# Patient Record
Sex: Male | Born: 1955 | Race: White | Hispanic: No | Smoking: Current every day smoker
Health system: Southern US, Community
[De-identification: ages and names within clinical notes are randomized; demographics above are authoritative.]

## PROBLEM LIST (undated history)

## (undated) DIAGNOSIS — F329 Major depressive disorder, single episode, unspecified: Secondary | ICD-10-CM

## (undated) DIAGNOSIS — F32A Depression, unspecified: Secondary | ICD-10-CM

## (undated) DIAGNOSIS — E119 Type 2 diabetes mellitus without complications: Secondary | ICD-10-CM

## (undated) HISTORY — PX: CHOLECYSTECTOMY: SHX55

## (undated) HISTORY — PX: OTHER SURGICAL HISTORY: SHX169

---

## 2013-12-03 ENCOUNTER — Encounter (HOSPITAL_COMMUNITY): Payer: Self-pay | Admitting: Emergency Medicine

## 2013-12-03 ENCOUNTER — Emergency Department (HOSPITAL_COMMUNITY)
Admission: EM | Admit: 2013-12-03 | Discharge: 2013-12-03 | Disposition: A | Discharge status: Home / Self Care | Payer: Medicaid - Out of State | Attending: Emergency Medicine | Admitting: Emergency Medicine

## 2013-12-03 DIAGNOSIS — R112 Nausea with vomiting, unspecified: Secondary | ICD-10-CM | POA: Insufficient documentation

## 2013-12-03 DIAGNOSIS — Z794 Long term (current) use of insulin: Secondary | ICD-10-CM | POA: Insufficient documentation

## 2013-12-03 DIAGNOSIS — F19939 Other psychoactive substance use, unspecified with withdrawal, unspecified: Secondary | ICD-10-CM | POA: Insufficient documentation

## 2013-12-03 DIAGNOSIS — IMO0001 Reserved for inherently not codable concepts without codable children: Secondary | ICD-10-CM | POA: Insufficient documentation

## 2013-12-03 DIAGNOSIS — R51 Headache: Secondary | ICD-10-CM | POA: Insufficient documentation

## 2013-12-03 DIAGNOSIS — F1120 Opioid dependence, uncomplicated: Secondary | ICD-10-CM | POA: Insufficient documentation

## 2013-12-03 DIAGNOSIS — E119 Type 2 diabetes mellitus without complications: Secondary | ICD-10-CM | POA: Insufficient documentation

## 2013-12-03 DIAGNOSIS — R300 Dysuria: Secondary | ICD-10-CM | POA: Insufficient documentation

## 2013-12-03 DIAGNOSIS — F17200 Nicotine dependence, unspecified, uncomplicated: Secondary | ICD-10-CM | POA: Insufficient documentation

## 2013-12-03 DIAGNOSIS — F1123 Opioid dependence with withdrawal: Secondary | ICD-10-CM

## 2013-12-03 DIAGNOSIS — J3489 Other specified disorders of nose and nasal sinuses: Secondary | ICD-10-CM | POA: Insufficient documentation

## 2013-12-03 DIAGNOSIS — R002 Palpitations: Secondary | ICD-10-CM | POA: Insufficient documentation

## 2013-12-03 DIAGNOSIS — R197 Diarrhea, unspecified: Secondary | ICD-10-CM | POA: Insufficient documentation

## 2013-12-03 DIAGNOSIS — R6883 Chills (without fever): Secondary | ICD-10-CM | POA: Insufficient documentation

## 2013-12-03 DIAGNOSIS — H539 Unspecified visual disturbance: Secondary | ICD-10-CM | POA: Insufficient documentation

## 2013-12-03 DIAGNOSIS — M25549 Pain in joints of unspecified hand: Secondary | ICD-10-CM | POA: Insufficient documentation

## 2013-12-03 HISTORY — DX: Type 2 diabetes mellitus without complications: E11.9

## 2013-12-03 MED ORDER — BUPRENORPHINE HCL-NALOXONE HCL 8-2 MG SL SUBL
SUBLINGUAL_TABLET | SUBLINGUAL | Status: AC
  Filled 2013-12-03: qty 1

## 2013-12-03 MED ORDER — BUPRENORPHINE HCL 8 MG SL SUBL: 20.0000 mg | SUBLINGUAL_TABLET | Freq: Every day | SUBLINGUAL | Status: DC

## 2013-12-03 MED ORDER — BUPRENORPHINE HCL-NALOXONE HCL 8-2 MG SL SUBL
2.0000 | SUBLINGUAL_TABLET | Freq: Every day | SUBLINGUAL | Status: DC
  Administered 2013-12-03: 2 via SUBLINGUAL

## 2013-12-03 NOTE — ED Provider Notes (Signed)
CSN: [id number]     Arrival date & time 12/03/13  1440 History  This chart was scribed for Vanetta Mulders, MD by Elon Spanner, ED Scribe. This patient was seen in room APA18/APA18 and the patient's care was started at 4:44 PM.    Chief Complaint  Patient presents with  . Joint Pain    The history is provided by the patient. No language interpreter was used.    HPI Comments: Tyler Burgess is a 58 y.o. male who presents to the Emergency Department complaining of constant, generalized body pain that began 3 days ago after failing to take his daily dose of Subutex.  Patient states he has taken 20 mg ( 8 mg x 2 and 2 mg x 2) of sublingual Subutex/Methadone daily for four years to treat his opiate dependency.  Patient states he recently moved to the area and missed his daily appointment at a methadone clinic and has not taken any Subutex for the last three days.  He describes his current feeling as his arms "want to fly", "shaky", and says the pain is "terrible".  Patient states he typically is seen at [facility] as well as a Methadone clinic in [place] to receive his Subutex/Methadone.  Patient states he has had associated chills, blurry vision, congestion, nausea, vomiting, diarrhea, palpitations, abdominal pain, dysuria.  Patient denies SOB.      PCP: none Past Medical History  Diagnosis Date  . Diabetes mellitus without complication    Past Surgical History  Procedure Laterality Date  . Right arm    . Cholecystectomy     No family history on file. History  Substance Use Topics  . Smoking status: Current Every Day Smoker -- 0.75 packs/day    Types: Cigarettes  . Smokeless tobacco: Not on file  . Alcohol Use: No    Review of Systems  Constitutional: Positive for chills. Negative for fever.  HENT: Positive for congestion. Negative for rhinorrhea and sore throat.   Eyes: Positive for visual disturbance.  Respiratory: Negative for cough and shortness of breath.    Cardiovascular: Positive for palpitations. Negative for leg swelling.  Gastrointestinal: Positive for nausea, vomiting, abdominal pain and diarrhea.  Genitourinary: Positive for dysuria.  Musculoskeletal: Positive for myalgias. Negative for back pain and neck pain.  Skin: Negative for rash.  Neurological: Positive for tremors and headaches.  Hematological: Does not bruise/bleed easily.  Psychiatric/Behavioral: Negative for confusion.      Allergies  Review of patient's allergies indicates no known allergies.  Home Medications   Prior to Admission medications   Medication Sig Start Date End Date Taking? Authorizing Provider  buprenorphine (SUBUTEX) 2 MG SUBL SL tablet Place 4 mg under the tongue every morning.   Yes Historical Provider, MD  buprenorphine (SUBUTEX) 8 MG SUBL SL tablet Place 16 mg under the tongue every morning.   Yes Historical Provider, MD  doxepin (SINEQUAN) 25 MG capsule Take 25 capsules by mouth at bedtime as needed. For sleep 09/27/13  Yes Historical Provider, MD  LANTUS 100 UNIT/ML injection Inject 24 Units into the skin at bedtime. 10/03/13  Yes Historical Provider, MD  NOVOLOG 100 UNIT/ML injection Inject 8 Units into the skin 3 (three) times daily before meals. 10/03/13  Yes Historical Provider, MD   BP 125/95  Pulse 84  Temp(Src) 97.9 F (36.6 C) (Oral)  Resp 18  Ht 5\' 10"  (1.778 m)  Wt 165 lb (74.844 kg)  BMI 23.68 kg/m2  SpO2 100% Physical Exam  Nursing note  and vitals reviewed. Constitutional: He is oriented to person, place, and time. He appears well-developed and well-nourished. No distress.  HENT:  Head: Normocephalic and atraumatic.  Eyes: Conjunctivae and EOM are normal.  Neck: Neck supple. No tracheal deviation present.  Cardiovascular: Normal rate, regular rhythm and normal heart sounds.   Pulmonary/Chest: Effort normal. No respiratory distress. He has no wheezes. He has no rales.  Abdominal: Bowel sounds are normal. There is no tenderness.   Musculoskeletal: Normal range of motion. He exhibits no edema.  Neurological: He is alert and oriented to person, place, and time. No cranial nerve deficit. He exhibits normal muscle tone. Coordination normal.  Skin: Skin is warm and dry.  Psychiatric: He has a normal mood and affect. His behavior is normal.    ED Course  Procedures (including critical care time)  DIAGNOSTIC STUDIES: Oxygen Saturation is 100% on RA, normal by my interpretation.    COORDINATION OF CARE:  4:55 PM Discussed that it would be difficult to get the patient admitted to an in-patient detox center considering his narcotic addiction has been well-managed with the Subutex/Methadone.  Discussed plan to provide patient with resources to seek further follow-up treatment for his opiate dependency.  Patient acknowledges and agrees with plan.    6:02 PM Advised patient that the pharmacy at [facility] did not have Subutex but did have Suboxone.  Patient was advised that Subutex was available at [facility] in [place], [place], but that he could not be prescribed Suboxone at this facility and also go to [facility] to receive the Subutex.  Patient understood and consented to being prescribed the Suboxone available at this facility.    Labs Review Labs Reviewed - No data to display  Imaging Review No results found.   EKG Interpretation None      MDM   Final diagnoses:  Opioid dependence with withdrawal      Patient nontoxic no acute distress. Patient with long-standing history of opiate dependence. Followed by methadone clinic in his home town. Here visiting his father whose having cancer problems [place]. Patient normally has been getting Subutex I. the methadone clinic in [place]. He failed to show up on Friday to get his doses for the weekend. Patient is now having whole-body pain with nausea vomiting and diarrhea having a withdrawal symptoms. Not particularly tachycardic not diaphoretic.  We did not have Subutex to give him so patient was given Suboxone he was okay with that her pharmacist calculated dose to be appropriate for him. That was given sublingual. Patient will followup with the methadone clinic tomorrow.  I personally performed the services described in this documentation, which was scribed in my presence. The recorded information has been reviewed and is accurate.     Vanetta MuldersScott Quadasia Newsham, MD 12/03/13 [phone]

## 2013-12-03 NOTE — Discharge Instructions (Signed)
[facility] that [facility] been providing your medications for you tomorrow. Return for any newer worse symptoms. Medication given here today we'll hold over until tomorrow.

## 2013-12-03 NOTE — ED Notes (Addendum)
PT has been out of subatex medication for 3 days and c/o increasing pain to all joints x1 day with no relief. PT c/o shaking/decreased appetite/headache and diarrhea.

## 2013-12-03 NOTE — ED Notes (Signed)
Pt informed that the ED does not stock the medication ordered by the EDP. AC notified and is checking hospital pharmacy for medication. Pt verbalized understanding.

## 2013-12-11 DIAGNOSIS — F319 Bipolar disorder, unspecified: Secondary | ICD-10-CM | POA: Insufficient documentation

## 2013-12-11 DIAGNOSIS — F489 Nonpsychotic mental disorder, unspecified: Secondary | ICD-10-CM | POA: Insufficient documentation

## 2013-12-11 DIAGNOSIS — E1142 Type 2 diabetes mellitus with diabetic polyneuropathy: Secondary | ICD-10-CM | POA: Insufficient documentation

## 2014-01-09 DIAGNOSIS — F322 Major depressive disorder, single episode, severe without psychotic features: Secondary | ICD-10-CM | POA: Insufficient documentation

## 2014-01-10 DIAGNOSIS — F1121 Opioid dependence, in remission: Secondary | ICD-10-CM | POA: Insufficient documentation

## 2014-04-09 ENCOUNTER — Emergency Department (HOSPITAL_COMMUNITY)
Admission: EM | Admit: 2014-04-09 | Discharge: 2014-04-11 | Disposition: A | Discharge status: Other Acute Inpatient Hospital | Payer: Medicaid - Out of State | Attending: Emergency Medicine | Admitting: Emergency Medicine

## 2014-04-09 ENCOUNTER — Encounter (HOSPITAL_COMMUNITY): Payer: Self-pay | Admitting: Emergency Medicine

## 2014-04-09 DIAGNOSIS — Z79899 Other long term (current) drug therapy: Secondary | ICD-10-CM | POA: Insufficient documentation

## 2014-04-09 DIAGNOSIS — E119 Type 2 diabetes mellitus without complications: Secondary | ICD-10-CM | POA: Insufficient documentation

## 2014-04-09 DIAGNOSIS — F32A Depression, unspecified: Secondary | ICD-10-CM

## 2014-04-09 DIAGNOSIS — Z720 Tobacco use: Secondary | ICD-10-CM | POA: Insufficient documentation

## 2014-04-09 DIAGNOSIS — F329 Major depressive disorder, single episode, unspecified: Secondary | ICD-10-CM | POA: Insufficient documentation

## 2014-04-09 DIAGNOSIS — R45851 Suicidal ideations: Secondary | ICD-10-CM

## 2014-04-09 DIAGNOSIS — F10920 Alcohol use, unspecified with intoxication, uncomplicated: Secondary | ICD-10-CM

## 2014-04-09 DIAGNOSIS — R45850 Homicidal ideations: Secondary | ICD-10-CM

## 2014-04-09 DIAGNOSIS — F10120 Alcohol abuse with intoxication, uncomplicated: Secondary | ICD-10-CM | POA: Insufficient documentation

## 2014-04-09 LAB — CBC WITH DIFFERENTIAL/PLATELET
BASOS PCT: 1 % (ref 0–1)
Basophils Absolute: 0.1 10*3/uL (ref 0.0–0.1)
Eosinophils Absolute: 0.1 10*3/uL (ref 0.0–0.7)
Eosinophils Relative: 1 % (ref 0–5)
HCT: 38.6 % — ABNORMAL LOW (ref 39.0–52.0)
Hemoglobin: 13.7 g/dL (ref 13.0–17.0)
LYMPHS ABS: 3.8 10*3/uL (ref 0.7–4.0)
Lymphocytes Relative: 34 % (ref 12–46)
MCH: 33.7 pg (ref 26.0–34.0)
MCHC: 35.5 g/dL (ref 30.0–36.0)
MCV: 95.1 fL (ref 78.0–100.0)
Monocytes Absolute: 1.1 10*3/uL — ABNORMAL HIGH (ref 0.1–1.0)
Monocytes Relative: 9 % (ref 3–12)
NEUTROS PCT: 55 % (ref 43–77)
Neutro Abs: 6.3 10*3/uL (ref 1.7–7.7)
Platelets: 220 10*3/uL (ref 150–400)
RBC: 4.06 MIL/uL — AB (ref 4.22–5.81)
RDW: 13 % (ref 11.5–15.5)
WBC: 11.4 10*3/uL — AB (ref 4.0–10.5)

## 2014-04-09 LAB — RAPID URINE DRUG SCREEN, HOSP PERFORMED
Amphetamines: NOT DETECTED
BARBITURATES: NOT DETECTED
Benzodiazepines: NOT DETECTED
Cocaine: NOT DETECTED
Opiates: NOT DETECTED
Tetrahydrocannabinol: NOT DETECTED

## 2014-04-09 LAB — BASIC METABOLIC PANEL
ANION GAP: 13 (ref 5–15)
BUN: 14 mg/dL (ref 6–23)
CHLORIDE: 99 meq/L (ref 96–112)
CO2: 25 meq/L (ref 19–32)
Calcium: 8.9 mg/dL (ref 8.4–10.5)
Creatinine, Ser: 1.23 mg/dL (ref 0.50–1.35)
GFR calc Af Amer: 74 mL/min — ABNORMAL LOW (ref >90)
GFR calc non Af Amer: 64 mL/min — ABNORMAL LOW (ref >90)
Glucose, Bld: 330 mg/dL — ABNORMAL HIGH (ref 70–99)
POTASSIUM: 4.2 meq/L (ref 3.7–5.3)
SODIUM: 137 meq/L (ref 137–147)

## 2014-04-09 LAB — ETHANOL: Alcohol, Ethyl (B): 228 mg/dL — ABNORMAL HIGH (ref 0–11)

## 2014-04-09 LAB — CBG MONITORING, ED
GLUCOSE-CAPILLARY: 293 mg/dL — AB (ref 70–99)
Glucose-Capillary: 284 mg/dL — ABNORMAL HIGH (ref 70–99)

## 2014-04-09 MED ORDER — VITAMIN B-1 100 MG PO TABS
100.0000 mg | ORAL_TABLET | Freq: Every day | ORAL | Status: DC
  Administered 2014-04-09 – 2014-04-10 (×2): 100 mg via ORAL
  Filled 2014-04-09 (×2): qty 1

## 2014-04-09 MED ORDER — DOXEPIN HCL 25 MG PO CAPS
25.0000 mg | ORAL_CAPSULE | Freq: Every evening | ORAL | Status: DC | PRN
  Filled 2014-04-09 (×3): qty 1

## 2014-04-09 MED ORDER — LORAZEPAM 1 MG PO TABS
0.0000 mg | ORAL_TABLET | Freq: Four times a day (QID) | ORAL | Status: DC
  Administered 2014-04-09 – 2014-04-10 (×2): 1 mg via ORAL
  Filled 2014-04-09 (×2): qty 1

## 2014-04-09 MED ORDER — THIAMINE HCL 100 MG/ML IJ SOLN: 100.0000 mg | Freq: Every day | INTRAMUSCULAR | Status: DC

## 2014-04-09 MED ORDER — INSULIN GLARGINE 100 UNIT/ML ~~LOC~~ SOLN
24.0000 [IU] | Freq: Every day | SUBCUTANEOUS | Status: DC
  Administered 2014-04-09: 24 [IU] via SUBCUTANEOUS
  Filled 2014-04-09 (×3): qty 0.24

## 2014-04-09 MED ORDER — LORAZEPAM 1 MG PO TABS: 0.0000 mg | ORAL_TABLET | Freq: Two times a day (BID) | ORAL | Status: DC

## 2014-04-09 MED ORDER — INSULIN ASPART 100 UNIT/ML ~~LOC~~ SOLN
8.0000 [IU] | Freq: Three times a day (TID) | SUBCUTANEOUS | Status: DC
  Administered 2014-04-10 (×2): 8 [IU] via SUBCUTANEOUS
  Filled 2014-04-09 (×3): qty 1

## 2014-04-09 NOTE — ED Notes (Signed)
Pt alert, states that he is frustrated, requesting RN to give him a pistol, pt states that he has been depressed and [facility] was not listening to him and that was why ":I did what I did", pt cooperative, ciwa scale completed, ativan given per ciwa scale, AC notified of need for lantus. [facility] remains at bedside,
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
## 2014-04-09 NOTE — ED Notes (Signed)
Pt watching tv, left foot handcuffed to bed, cms intact [facility] remains at bedside, ,

## 2014-04-09 NOTE — ED Notes (Signed)
Telepsych in process 
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
## 2014-04-09 NOTE — ED Notes (Signed)
Pt resting with eyes closed, resp even and non labored, left wrist handcuffed to stretcher, [facility] at bedside,

## 2014-04-09 NOTE — BH Assessment (Signed)
Write informed TTS Belenda CruiseKristin of the consult.

## 2014-04-10 LAB — CBG MONITORING, ED
GLUCOSE-CAPILLARY: 173 mg/dL — AB (ref 70–99)
GLUCOSE-CAPILLARY: 75 mg/dL (ref 70–99)
GLUCOSE-CAPILLARY: 89 mg/dL (ref 70–99)
Glucose-Capillary: 44 mg/dL — CL (ref 70–99)
Glucose-Capillary: 191 mg/dL — ABNORMAL HIGH (ref 70–99)
Glucose-Capillary: 69 mg/dL — ABNORMAL LOW (ref 70–99)
Glucose-Capillary: 153 mg/dL — ABNORMAL HIGH (ref 70–99)

## 2014-04-10 MED ORDER — INSULIN GLARGINE 100 UNIT/ML ~~LOC~~ SOLN
18.0000 [IU] | Freq: Every day | SUBCUTANEOUS | Status: DC
Start: 2014-04-10 — End: 2014-04-11
  Administered 2014-04-10: 18 [IU] via SUBCUTANEOUS
  Filled 2014-04-10 (×2): qty 0.18

## 2014-04-10 NOTE — ED Notes (Signed)
Per Inetta Fermoina at [facility], [facility] is looking at patient, will continue to seek inpatient placement.

## 2014-04-10 NOTE — [facility] Counselor (Signed)
Faxed referral to [facility]   Kateri PlummerKristin Desia Saban, M.S., LPCA, [facility] Licensed Professional Counselor Associate  Triage Specialist  [facility]  Therapeutic Triage Services Phone: [phone] Fax: [phone]

## 2014-04-10 NOTE — ED Notes (Signed)
[facility] will accept patient if CBG's are consistent for 24 hours per [facility]. EDP aware and Diabetic Coordinator notes reviewed, bedtime Lantus decreased per recommendation.

## 2014-04-10 NOTE — Progress Notes (Signed)
[facility].   Derrell Lollingoris Dayden Viverette, MSW l Social Worker [phone]

## 2014-04-10 NOTE — ED Notes (Addendum)
Patient reports head is filling "fuzzy". CBG is 44. Patient given 4 oz of Orange Juice and breakfast tray. Will not give insulin yet and reassess patient after breakfast.

## 2014-04-10 NOTE — BHH Counselor (Signed)
Faxed to following facilities in attempt to obtain inpatient placement:  [facility] [address]      Cyndie MullAnna Nysha Koplin, [facility] Triage Specialist

## 2014-04-10 NOTE — Progress Notes (Signed)
Inpatient Diabetes Program Recommendations  AACE/ADA: New Consensus Statement on Inpatient Glycemic Control (2013)  Target Ranges:  Prepandial:   less than 140 mg/dL      Peak postprandial:   less than 180 mg/dL (1-2 hours)      Critically ill patients:  140 - 180 mg/dL   Results for Tyler Burgess, [name] (MRN [id number]) as of 04/10/2014 07:42  Ref. Range 04/09/2014 16:55 04/09/2014 21:03 04/10/2014 07:23  Glucose-Capillary Latest Range: 70-99 mg/dL 409293 (H) 811284 (H) 44 (LL)   Outpatient Diabetes medications: Lantus 24 units QHS, Novolog 8 units TID with meals Current orders for Inpatient glycemic control: Lantus 24 units QHS, Novolog 8 units TID with meals  Inpatient Diabetes Program Recommendations Insulin - Basal: Noted fasting glucose was 44 mg/dl this morning. Please consider decreasing Lantus. Correction (SSI): Please consider ordering CBGs with Novolog correction scale ACHS. HgbA1C: Please consider ordering an A1C to evaluate glycemic control over the past 2-3 months.  Thanks, Orlando PennerMarie Madason Rauls, RN, MSN, CCRN, CDE Diabetes Coordinator Inpatient Diabetes Program [phone] (Team Pager) [phone] (AP office) [phone] [facility] office)

## 2014-04-10 NOTE — ED Notes (Signed)
[facility].  Due to Substance Abuse.  Nurse informed.

## 2014-04-11 ENCOUNTER — Encounter (HOSPITAL_COMMUNITY): Payer: Self-pay | Admitting: *Deleted

## 2014-04-11 ENCOUNTER — Inpatient Hospital Stay (HOSPITAL_COMMUNITY)
Admission: EM | Admit: 2014-04-11 | Discharge: 2014-04-18 | DRG: 885 | Disposition: A | Discharge status: Home / Self Care | Payer: 59 | Source: Intra-hospital | Attending: Psychiatry | Admitting: Psychiatry

## 2014-04-11 DIAGNOSIS — F1020 Alcohol dependence, uncomplicated: Secondary | ICD-10-CM | POA: Diagnosis present

## 2014-04-11 DIAGNOSIS — F332 Major depressive disorder, recurrent severe without psychotic features: Secondary | ICD-10-CM | POA: Diagnosis present

## 2014-04-11 DIAGNOSIS — F411 Generalized anxiety disorder: Secondary | ICD-10-CM | POA: Diagnosis present

## 2014-04-11 DIAGNOSIS — F1121 Opioid dependence, in remission: Secondary | ICD-10-CM | POA: Diagnosis present

## 2014-04-11 DIAGNOSIS — F333 Major depressive disorder, recurrent, severe with psychotic symptoms: Secondary | ICD-10-CM

## 2014-04-11 DIAGNOSIS — E1165 Type 2 diabetes mellitus with hyperglycemia: Secondary | ICD-10-CM | POA: Diagnosis present

## 2014-04-11 DIAGNOSIS — Z794 Long term (current) use of insulin: Secondary | ICD-10-CM

## 2014-04-11 DIAGNOSIS — Z634 Disappearance and death of family member: Secondary | ICD-10-CM

## 2014-04-11 DIAGNOSIS — R45851 Suicidal ideations: Secondary | ICD-10-CM

## 2014-04-11 DIAGNOSIS — F1190 Opioid use, unspecified, uncomplicated: Secondary | ICD-10-CM

## 2014-04-11 DIAGNOSIS — Z79899 Other long term (current) drug therapy: Secondary | ICD-10-CM | POA: Diagnosis not present

## 2014-04-11 DIAGNOSIS — F17210 Nicotine dependence, cigarettes, uncomplicated: Secondary | ICD-10-CM | POA: Diagnosis present

## 2014-04-11 DIAGNOSIS — E11649 Type 2 diabetes mellitus with hypoglycemia without coma: Secondary | ICD-10-CM | POA: Diagnosis not present

## 2014-04-11 DIAGNOSIS — R4589 Other symptoms and signs involving emotional state: Secondary | ICD-10-CM | POA: Diagnosis present

## 2014-04-11 DIAGNOSIS — F10239 Alcohol dependence with withdrawal, unspecified: Secondary | ICD-10-CM

## 2014-04-11 DIAGNOSIS — R4689 Other symptoms and signs involving appearance and behavior: Secondary | ICD-10-CM

## 2014-04-11 HISTORY — DX: Major depressive disorder, single episode, unspecified: F32.9

## 2014-04-11 HISTORY — DX: Depression, unspecified: F32.A

## 2014-04-11 LAB — GLUCOSE, CAPILLARY
GLUCOSE-CAPILLARY: 214 mg/dL — AB (ref 70–99)
GLUCOSE-CAPILLARY: 199 mg/dL — AB (ref 70–99)
Glucose-Capillary: 167 mg/dL — ABNORMAL HIGH (ref 70–99)

## 2014-04-11 LAB — CBG MONITORING, ED
Glucose-Capillary: 54 mg/dL — ABNORMAL LOW (ref 70–99)
Glucose-Capillary: 94 mg/dL (ref 70–99)

## 2014-04-11 MED ORDER — INSULIN ASPART 100 UNIT/ML ~~LOC~~ SOLN
8.0000 [IU] | Freq: Three times a day (TID) | SUBCUTANEOUS | Status: DC
  Administered 2014-04-11 – 2014-04-12 (×3): 8 [IU] via SUBCUTANEOUS

## 2014-04-11 MED ORDER — ALUM & MAG HYDROXIDE-SIMETH 200-200-20 MG/5ML PO SUSP: 30.0000 mL | ORAL | Status: DC | PRN

## 2014-04-11 MED ORDER — LORAZEPAM 1 MG PO TABS
1.0000 mg | ORAL_TABLET | Freq: Two times a day (BID) | ORAL | Status: AC
  Administered 2014-04-13 (×2): 1 mg via ORAL
  Filled 2014-04-11: qty 1

## 2014-04-11 MED ORDER — MAGNESIUM HYDROXIDE 400 MG/5ML PO SUSP: 30.0000 mL | Freq: Every day | ORAL | Status: DC | PRN

## 2014-04-11 MED ORDER — LORAZEPAM 1 MG PO TABS
1.0000 mg | ORAL_TABLET | Freq: Four times a day (QID) | ORAL | Status: AC
  Administered 2014-04-11 (×3): 1 mg via ORAL
  Filled 2014-04-11 (×3): qty 1

## 2014-04-11 MED ORDER — TRAZODONE HCL 50 MG PO TABS
50.0000 mg | ORAL_TABLET | Freq: Every day | ORAL | Status: DC
  Administered 2014-04-11 – 2014-04-15 (×5): 50 mg via ORAL
  Filled 2014-04-11 (×9): qty 1

## 2014-04-11 MED ORDER — LORAZEPAM 1 MG PO TABS
1.0000 mg | ORAL_TABLET | Freq: Every day | ORAL | Status: AC
  Administered 2014-04-14: 1 mg via ORAL
  Filled 2014-04-11: qty 1

## 2014-04-11 MED ORDER — ONDANSETRON 4 MG PO TBDP: 4.0000 mg | ORAL_TABLET | Freq: Four times a day (QID) | ORAL | Status: AC | PRN

## 2014-04-11 MED ORDER — THIAMINE HCL 100 MG/ML IJ SOLN
100.0000 mg | Freq: Once | INTRAMUSCULAR | Status: AC
  Administered 2014-04-11: 100 mg via INTRAMUSCULAR
  Filled 2014-04-11: qty 2

## 2014-04-11 MED ORDER — INSULIN ASPART 100 UNIT/ML ~~LOC~~ SOLN
0.0000 [IU] | Freq: Three times a day (TID) | SUBCUTANEOUS | Status: DC
  Administered 2014-04-11: 3 [IU] via SUBCUTANEOUS
  Administered 2014-04-11: 2 [IU] via SUBCUTANEOUS
  Administered 2014-04-12 (×2): 7 [IU] via SUBCUTANEOUS
  Administered 2014-04-13: 9 [IU] via SUBCUTANEOUS
  Administered 2014-04-13: 3 [IU] via SUBCUTANEOUS
  Administered 2014-04-14: 5 [IU] via SUBCUTANEOUS
  Administered 2014-04-14: 17:00:00 via SUBCUTANEOUS
  Administered 2014-04-15: 9 [IU] via SUBCUTANEOUS
  Administered 2014-04-15: 2 [IU] via SUBCUTANEOUS

## 2014-04-11 MED ORDER — LORAZEPAM 1 MG PO TABS
1.0000 mg | ORAL_TABLET | Freq: Three times a day (TID) | ORAL | Status: AC
  Administered 2014-04-12 (×3): 1 mg via ORAL
  Filled 2014-04-11 (×3): qty 1

## 2014-04-11 MED ORDER — LOPERAMIDE HCL 2 MG PO CAPS
2.0000 mg | ORAL_CAPSULE | ORAL | Status: AC | PRN
  Administered 2014-04-13 – 2014-04-14 (×3): 4 mg via ORAL
  Administered 2014-04-14: 2 mg via ORAL
  Filled 2014-04-11: qty 2
  Filled 2014-04-11 (×2): qty 1
  Filled 2014-04-11: qty 2

## 2014-04-11 MED ORDER — HYDROXYZINE HCL 25 MG PO TABS
25.0000 mg | ORAL_TABLET | Freq: Four times a day (QID) | ORAL | Status: AC | PRN
  Administered 2014-04-12: 25 mg via ORAL
  Filled 2014-04-11: qty 1

## 2014-04-11 MED ORDER — ACETAMINOPHEN 325 MG PO TABS: 650.0000 mg | ORAL_TABLET | Freq: Four times a day (QID) | ORAL | Status: DC | PRN

## 2014-04-11 MED ORDER — VITAMIN B-1 100 MG PO TABS
100.0000 mg | ORAL_TABLET | Freq: Every day | ORAL | Status: DC
  Administered 2014-04-12 – 2014-04-18 (×7): 100 mg via ORAL
  Filled 2014-04-11 (×10): qty 1

## 2014-04-11 MED ORDER — INSULIN GLARGINE 100 UNIT/ML ~~LOC~~ SOLN
24.0000 [IU] | Freq: Every day | SUBCUTANEOUS | Status: DC
  Administered 2014-04-11: 24 [IU] via SUBCUTANEOUS

## 2014-04-11 MED ORDER — SERTRALINE HCL 25 MG PO TABS
25.0000 mg | ORAL_TABLET | Freq: Every day | ORAL | Status: DC
  Administered 2014-04-11 – 2014-04-13 (×3): 25 mg via ORAL
  Filled 2014-04-11 (×5): qty 1

## 2014-04-11 MED ORDER — LORAZEPAM 1 MG PO TABS
1.0000 mg | ORAL_TABLET | Freq: Four times a day (QID) | ORAL | Status: AC | PRN
  Filled 2014-04-11: qty 1

## 2014-04-11 MED ORDER — GABAPENTIN 100 MG PO CAPS
100.0000 mg | ORAL_CAPSULE | Freq: Three times a day (TID) | ORAL | Status: DC
  Administered 2014-04-11 – 2014-04-18 (×20): 100 mg via ORAL
  Filled 2014-04-11: qty 42
  Filled 2014-04-11 (×10): qty 1
  Filled 2014-04-11: qty 42
  Filled 2014-04-11: qty 1
  Filled 2014-04-11: qty 42
  Filled 2014-04-11 (×6): qty 1
  Filled 2014-04-11: qty 42
  Filled 2014-04-11 (×3): qty 1
  Filled 2014-04-11: qty 42
  Filled 2014-04-11 (×4): qty 1
  Filled 2014-04-11: qty 42
  Filled 2014-04-11 (×2): qty 1

## 2014-04-11 MED ORDER — ADULT MULTIVITAMIN W/MINERALS CH
1.0000 | ORAL_TABLET | Freq: Every day | ORAL | Status: DC
  Administered 2014-04-11 – 2014-04-18 (×8): 1 via ORAL
  Filled 2014-04-11 (×2): qty 14
  Filled 2014-04-11 (×10): qty 1
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
## 2014-04-11 NOTE — Progress Notes (Signed)
Adult Psychoeducational Group Note  Date:  04/11/2014 Time:  8:46 PM  Group Topic/Focus:  Wrap-Up Group:   The focus of this group is to help patients review their daily goal of treatment and discuss progress on daily workbooks.  Participation Level:  Active  Participation Quality:  Appropriate  Affect:  Appropriate  Cognitive:  Appropriate  Insight: Appropriate  Engagement in Group:  Improving  Modes of Intervention:  Discussion  Additional Comments: The patient expressed that he enjoyed music therapy in group.  Octavio Mannshigpen, Danta Baumgardner Lee 04/11/2014, 8:46 PM

## 2014-04-11 NOTE — BH Assessment (Addendum)
[facility]  [facility]   Dr. Elna BreslowEappen is the accepting doctor and the nurse will arrange [facility] due to the patient being [facility].

## 2014-04-11 NOTE — ED Provider Notes (Signed)
[facility] by Dr Elna BreslowEappen.  Pt is ambulatory in his room. States he has a headache. Is happy [facility].   Devoria AlbeIva Azarya Oconnell, MD, FACEP   Ward GivensIva L Derin Granquist, MD 04/11/14 [phone]

## 2014-04-11 NOTE — Progress Notes (Signed)
Inpatient Diabetes Program Recommendations  AACE/ADA: New Consensus Statement on Inpatient Glycemic Control (2013)  Target Ranges:  Prepandial:   less than 140 mg/dL      Peak postprandial:   less than 180 mg/dL (1-2 hours)      Critically ill patients:  140 - 180 mg/dL   Reason for Assessment: Diabetes Consult  Outpatient Diabetes medications: Lantus 24 units QHS, Novolog 8 units TID with meals Current orders for Inpatient glycemic control: Lantus 24 units QHS, Novolog 8 units TID with meals, Novolog sensitive tidwc   Results for Tyler Burgess, Tyler Burgess (MRN [id number]) as of 04/11/2014 12:17  Ref. Range 04/10/2014 13:06 04/10/2014 13:53 04/10/2014 16:39 04/10/2014 18:19 04/10/2014 22:00 04/11/2014 04:51 04/11/2014 06:52  Glucose-Capillary Latest Range: 70-99 mg/dL 69 (L) 191173 (H) 478153 (H) 75 89 54 (L) 94   Continues with hypoglycemia.  Recommendations: Reduce Lantus to 15 units QHS. Reduce Novolog to 3 units tidwc for meal coverage insulin if pt eats >50% meal. DO NOT GIVE MEAL COVERAGE INSULIN IF CBG < 80 MG/DL.  Will continue to follow. Thank you. Ailene Ardshonda Alexsis Branscom, RD, LDN, CDE Inpatient Diabetes Coordinator [phone]
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
## 2014-04-11 NOTE — ED Notes (Signed)
Paper work complete, Technical sales engineerofficer here to transport pt, officer wanting Pt dressing, belonging returned to pt.
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
## 2014-04-11 NOTE — Tx Team (Signed)
Initial Interdisciplinary Treatment Plan   PATIENT STRESSORS: Financial difficulties Health problems Loss of wife, son, father, and close friends   PATIENT STRENGTHS: Average or above average intelligence Capable of independent living Supportive family/friends   PROBLEM LIST: Problem List/Patient Goals Date to be addressed Date deferred Reason deferred Estimated date of resolution  Sleep disturbance 04/11/2014     depression 04/11/2014                                                DISCHARGE CRITERIA:  Improved stabilization in mood, thinking, and/or behavior Need for constant or close observation no longer present  PRELIMINARY DISCHARGE PLAN: Outpatient therapy  PATIENT/FAMIILY INVOLVEMENT: This treatment plan has been presented to and reviewed with the patient, Tyler Burgess.  The patient and family have been given the opportunity to ask questions and make suggestions.  Leighton Parodyyson, Caylen Kuwahara M 04/11/2014, 12:34 PM

## 2014-04-11 NOTE — ED Notes (Signed)
[facility]

## 2014-04-11 NOTE — Progress Notes (Signed)
Patient reports that he has been having trouble sleeping and constantly thinks of his loss family members; patient loss his wife, brother, father, and 3 close friends since 2013; patient stated he thinks about them all the time; patient reports that he loves in a truck because he loss his home and his business; patient is tearful when speaking about all this and said the only reason he did what he did was for some help

## 2014-04-12 LAB — GLUCOSE, CAPILLARY
GLUCOSE-CAPILLARY: 327 mg/dL — AB (ref 70–99)
GLUCOSE-CAPILLARY: 312 mg/dL — AB (ref 70–99)
Glucose-Capillary: 72 mg/dL (ref 70–99)
Glucose-Capillary: 134 mg/dL — ABNORMAL HIGH (ref 70–99)
Glucose-Capillary: 221 mg/dL — ABNORMAL HIGH (ref 70–99)

## 2014-04-12 LAB — HEMOGLOBIN A1C
HEMOGLOBIN A1C: 9.5 % — AB (ref <5.7)
MEAN PLASMA GLUCOSE: 226 mg/dL — AB (ref <117)

## 2014-04-12 LAB — TSH: TSH: 3.2 u[IU]/mL (ref 0.350–4.500)

## 2014-04-12 MED ORDER — INSULIN ASPART 100 UNIT/ML ~~LOC~~ SOLN
3.0000 [IU] | Freq: Three times a day (TID) | SUBCUTANEOUS | Status: DC
  Administered 2014-04-12 – 2014-04-15 (×9): 3 [IU] via SUBCUTANEOUS

## 2014-04-12 MED ORDER — INSULIN GLARGINE 100 UNIT/ML ~~LOC~~ SOLN
15.0000 [IU] | Freq: Every day | SUBCUTANEOUS | Status: DC
  Administered 2014-04-12 – 2014-04-14 (×3): 15 [IU] via SUBCUTANEOUS

## 2014-04-12 MED ORDER — ARIPIPRAZOLE 2 MG PO TABS
2.0000 mg | ORAL_TABLET | Freq: Every day | ORAL | Status: DC
  Administered 2014-04-12: 2 mg via ORAL
  Filled 2014-04-12 (×2): qty 1
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
## 2014-04-12 NOTE — Progress Notes (Signed)
Pt has been up and active in the milieu today. He rated all his depression hopelessness and anxiety a 5 on his self-inventory.  He denied any A/V/H or H/I but admits to some passive S/I no plans here in the hospital and does contract verbally to come to staff before acting on any self-harm thoughts.  He did admit to hearing a "roaring sound and sometimes a popping sound" encouraged his to talk with the doctor today and he voiced understanding. His goal today,"staying alive and not hanging myself today" he planned to walk and stay around people to attend groups and take his medications.  He did write "I would tell staff before any attempt of hanging. Again, he was encouraged to talk with his doctor and he did agree. Around 1705, pt's roommate informed staff that [name] had a handmade noose. Talking with pt and at first he stated,"I had it in my sock from the emergency room" staff then found the blanket that pt had ripped and torn once pt seen that blanket he admitted he did indeed make it here even though he had verbally contracted to come to staff.  Received an order for 1:1 for pt's safety.  Pt was angry at first saying things like "I am not going to eat or take any medications".  He soon calmed down and he indeed ate his dinner and took his medications without incident. Please see the 1:1 progress note in shadow chart.
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
## 2014-04-12 NOTE — Plan of Care (Signed)
Problem: Diagnosis: Increased Risk For Suicide Attempt Goal: STG-Patient Will Report Suicidal Feelings to Staff Outcome: Progressing Pt does admit to having some passive S/I today he does contract verbally to come to staff before acting on any self-harm thoughts.

## 2014-04-12 NOTE — Tx Team (Signed)
  Interdisciplinary Treatment Plan Update   Date Reviewed:  04/12/2014  Time Reviewed:  9:41 AM  Progress in Treatment:   Attending groups: Yes Participating in groups: Yes Taking medication as prescribed: Yes  Tolerating medication: Yes Family/Significant other contact made: No  Patient understands diagnosis: Yes AEB asking for help with sleep, depression, psychosis Discussing patient identified problems/goals with staff: Yes  See initial care plan Medical problems stabilized or resolved: Yes Denies suicidal/homicidal ideation: Yes  In tx team Patient has not harmed self or others: Yes  For review of initial/current patient goals, please see plan of care.  Estimated Length of Stay:  4-5 days  Reason for Continuation of Hospitalization: Depression Hallucinations Medication stabilization  New Problems/Goals identified:  N/A  Discharge Plan or Barriers:   return home, follow up outpt  Additional Comments:  Tyler Burgess is an 58 y.o. male who came into the ED by sheriffs office after [facility]. He stated that he wanted to see the Dr. Because he wasn't sleeping and when they told him it would be a 2 to 3 month wait he said "I'll see the Doctor now" and put the gun on the counter. During assessment he was cooperative and pleasant. He stated that he just wanted a medication adjustment because he hasn't been sleeping. He admits that he was having homicidal thoughts earlier today and does endorse suicidal thoughts currently with no plan or intent. He states that his depression has been getting worse and he has been drinking 2 40 ounce beers almost daily.    Attendees:  Signature: Ivin BootySarama Eappen, MD 04/12/2014 9:41 AM   Signature: Richelle Itood Taydon Nasworthy, LCSW 04/12/2014 9:41 AM  Signature: Fransisca KaufmannLaura Davis, NP 04/12/2014 9:41 AM  Signature: Robbie LouisVivian Kent, RN  04/12/2014 9:41 AM  Signature:  04/12/2014 9:41 AM  Signature:  04/12/2014 9:41 AM  Signature:   04/12/2014  9:41 AM  Signature:    Signature:    Signature:    Signature:    Signature:    Signature:      Scribe for Treatment Team:   Richelle Itood Abhimanyu Cruces, LCSW  04/12/2014 9:41 AM

## 2014-04-12 NOTE — Progress Notes (Signed)
[facility]  04/12/2014 2:56 PM Tyler Burgess  MRN:  [id number] Subjective: Patient states 'I am still depressed ,but getting better,I hear this buzzing noise in my head ".  Objective; Tyler Burgess is an 58 y.o. Caucasian male , widowed ,employed [facility] in by [employer] after he [facility].  Patient today reports AH of "buzzing noise " in his head. Patient today reports some improvement with his depressive sx, reports improvement in his anxiety. Reports sleep as better. Patient continues to have paranoia and feels that people are watching him.  Patient is alert ,oriented x3, has BL tremors of his hands. VS reviewed -wnl  Per staff patient reports AH of buzzing in his head. Patient is compliant on his medications. Denies side effects.   Diagnosis:   DSM5: Primary Psychiatric Diagnosis: MDD,recurrent severe without psychosis   Secondary Psychiatric Diagnosis: Bereavement Generalized anxiety disorder Alcohol use disorder ,severe Alcohol withdrawal,severe without perceptual disturbances Opioid use disorder ,moderate in sustained remission   Non Psychiatric Diagnosis: DM     Total Time spent with patient: 45 minutes   ADL's:  Intact  Sleep: Fair  Appetite:  Fair   Psychiatric Specialty Exam: Physical Exam  ROS  Blood pressure 109/72, pulse 61, temperature 98 F (36.7 C), temperature source Oral, resp. rate 20, height 5' 9.75" (1.772 m), weight 72.576 kg (160 lb).Body mass index is 23.11 kg/(m^2).  General Appearance: Casual  Eye Contact::  Fair  Speech:  Clear and Coherent  Volume:  Normal  Mood:  Anxious and Depressed  Affect:  Congruent  Thought Process:  Irrelevant  Orientation:  Full (Time, Place, and Person)  Thought Content:  Hallucinations: Auditory and Paranoid Ideation  Suicidal Thoughts:  No  Homicidal Thoughts:  No  Memory:  Immediate;   Fair Recent;   Fair Remote;   Fair   Judgement:  Impaired  Insight:  Lacking  Psychomotor Activity:  Normal  Concentration:  Fair  Recall:  Good  Fund of Knowledge:Good  Language: Good  Akathisia:  No  Handed:  Right  AIMS (if indicated):     Assets:  Desire for Improvement  Sleep:  Number of Hours: 6.25   Musculoskeletal: Strength & Muscle Tone: within normal limits Gait & Station: normal Patient leans: N/A  Current Medications: Current Facility-Administered Medications  Medication Dose Route Frequency Provider Last Rate Last Dose  . acetaminophen (TYLENOL) tablet 650 mg  650 mg Oral Q6H PRN Jomarie LongsSaramma Ioannis Schuh, MD      . alum & mag hydroxide-simeth (MAALOX/MYLANTA) 200-200-20 MG/5ML suspension 30 mL  30 mL Oral Q4H PRN Hensley Aziz, MD      . ARIPiprazole (ABILIFY) tablet 2 mg  2 mg Oral QHS Cloyce Blankenhorn, MD      . gabapentin (NEURONTIN) capsule 100 mg  100 mg Oral TID Jomarie LongsSaramma Ryman Rathgeber, MD   100 mg at 04/12/14 1153  . hydrOXYzine (ATARAX/VISTARIL) tablet 25 mg  25 mg Oral Q6H PRN Sameera Betton, MD      . insulin aspart (novoLOG) injection 0-9 Units  0-9 Units Subcutaneous TID WC Jomarie LongsSaramma Layson Bertsch, MD   7 Units at 04/12/14 1200  . insulin aspart (novoLOG) injection 3 Units  3 Units Subcutaneous TID WC Shed Nixon, MD      . insulin glargine (LANTUS) injection 15 Units  15 Units Subcutaneous QHS Rajanee Schuelke, MD      . loperamide (IMODIUM) capsule 2-4 mg  2-4 mg Oral PRN  Jomarie LongsSaramma Atlantis Delong, MD      . LORazepam (ATIVAN) tablet 1 mg  1 mg Oral Q6H PRN Jomarie LongsSaramma Azalyn Sliwa, MD      . LORazepam (ATIVAN) tablet 1 mg  1 mg Oral TID Jomarie LongsSaramma Brookelynne Dimperio, MD   1 mg at 04/12/14 1153   Followed by  . [START ON 04/13/2014] LORazepam (ATIVAN) tablet 1 mg  1 mg Oral BID Jomarie LongsSaramma Cresta Riden, MD       Followed by  . [START ON 04/14/2014] LORazepam (ATIVAN) tablet 1 mg  1 mg Oral Daily Illyana Schorsch, MD      . magnesium hydroxide (MILK OF MAGNESIA) suspension 30 mL  30 mL Oral Daily PRN Jomarie LongsSaramma Taquanna Borras, MD      . multivitamin with minerals tablet 1  tablet  1 tablet Oral Daily Jomarie LongsSaramma Jenny Omdahl, MD   1 tablet at 04/12/14 0836  . ondansetron (ZOFRAN-ODT) disintegrating tablet 4 mg  4 mg Oral Q6H PRN Jomarie LongsSaramma Johnathen Testa, MD      . sertraline (ZOLOFT) tablet 25 mg  25 mg Oral Daily Jomarie LongsSaramma Shiana Rappleye, MD   25 mg at 04/12/14 0836  . thiamine (VITAMIN B-1) tablet 100 mg  100 mg Oral Daily Jomarie LongsSaramma Letcher Schweikert, MD   100 mg at 04/12/14 0836  . traZODone (DESYREL) tablet 50 mg  50 mg Oral QHS Jomarie LongsSaramma Annaliza Zia, MD   50 mg at 04/11/14 2126    Lab Results:  Results for orders placed or performed during the hospital encounter of 04/11/14 (from the past 48 hour(s))  Glucose, capillary     Status: Abnormal   Collection Time: 04/11/14 11:52 AM  Result Value Ref Range   Glucose-Capillary 214 (H) 70 - 99 mg/dL  Glucose, capillary     Status: Abnormal   Collection Time: 04/11/14  4:39 PM  Result Value Ref Range   Glucose-Capillary 199 (H) 70 - 99 mg/dL   Comment 1 Notify RN   Glucose, capillary     Status: Abnormal   Collection Time: 04/11/14  8:29 PM  Result Value Ref Range   Glucose-Capillary 167 (H) 70 - 99 mg/dL  Glucose, capillary     Status: None   Collection Time: 04/12/14  6:14 AM  Result Value Ref Range   Glucose-Capillary 72 70 - 99 mg/dL  TSH     Status: None   Collection Time: 04/12/14  6:35 AM  Result Value Ref Range   TSH 3.200 0.350 - 4.500 uIU/mL    Comment: [facility]  Glucose, capillary     Status: Abnormal   Collection Time: 04/12/14  8:13 AM  Result Value Ref Range   Glucose-Capillary 134 (H) 70 - 99 mg/dL   Comment 1 Documented in Chart    Comment 2 Notify RN   Glucose, capillary     Status: Abnormal   Collection Time: 04/12/14 11:50 AM  Result Value Ref Range   Glucose-Capillary 327 (H) 70 - 99 mg/dL   Comment 1 Documented in Chart    Comment 2 Notify RN     Physical Findings: AIMS: Facial and Oral Movements Muscles of Facial Expression: None, normal Lips and Perioral Area: None, normal Jaw: None,  normal Tongue: None, normal,Extremity Movements Upper (arms, wrists, hands, fingers): None, normal Lower (legs, knees, ankles, toes): None, normal, Trunk Movements Neck, shoulders, hips: None, normal, Overall Severity Severity of abnormal movements (highest score from questions above): None, normal Incapacitation due to abnormal movements: None, normal Patient's awareness of abnormal movements (rate only patient's report): No Awareness,  CIWA:  CIWA-Ar Total: 4 COWS:     Treatment Plan Summary: Daily contact with patient to assess and evaluate symptoms and progress in treatment Medication management  Plan: Will continue Zoloft 25 mg po daily.Will add Abilify 2 mg po qhs for AH. Will continue Gabapentin 100 mg po tid for anxiety sx. Will start CIWA/Ativan protocol for ETOH withdrawal. Will continue Trazodone 50 mg po qhs for sleep. Chaplain consult for spiritual support. DM coordinator consult for management of DM.See consult note. Fall precaution.  Will continue to monitor vitals ,medication compliance and treatment side effects while patient is here.  Will monitor for medical issues as well as call consult as needed.  Reviewed labs ,TSH -wnl.  CSW will start working on disposition.  Patient to participate in therapeutic milieu .       Medical Decision Making Problem Points:  Established problem, stable/improving (1), Review of last therapy session (1) and Review of psycho-social stressors (1) Data Points:  Review or order clinical lab tests (1) Review or order medicine tests (1) Review and summation of old records (2) Review of medication regiment & side effects (2) Review of new medications or change in dosage (2)  I certify that inpatient services furnished can reasonably be expected to improve the patient's condition.   Verline Kong MD 04/12/2014, 2:56 PM
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
## 2014-04-12 NOTE — [facility] Group Notes (Signed)
0900 nursing orientation group   The focus of this group is to educate the patient on the purpose and policies of crisis stabilization and provide a format to answer questions about their admission.  The group details unit policies and expectations of patients while admitted.  Pt did attend and he was an active participant in group and appropriate in sharing.    

## 2014-04-13 LAB — GLUCOSE, CAPILLARY
GLUCOSE-CAPILLARY: 101 mg/dL — AB (ref 70–99)
Glucose-Capillary: 118 mg/dL — ABNORMAL HIGH (ref 70–99)
Glucose-Capillary: 246 mg/dL — ABNORMAL HIGH (ref 70–99)
Glucose-Capillary: 365 mg/dL — ABNORMAL HIGH (ref 70–99)

## 2014-04-13 MED ORDER — BENZTROPINE MESYLATE 0.5 MG PO TABS
ORAL_TABLET | ORAL | Status: AC
  Administered 2014-04-13: 0.5 mg via ORAL
  Filled 2014-04-13: qty 1

## 2014-04-13 MED ORDER — SERTRALINE HCL 50 MG PO TABS
50.0000 mg | ORAL_TABLET | Freq: Every day | ORAL | Status: DC
  Administered 2014-04-14 – 2014-04-18 (×5): 50 mg via ORAL
  Filled 2014-04-13 (×2): qty 1
  Filled 2014-04-13: qty 14
  Filled 2014-04-13 (×4): qty 1
  Filled 2014-04-13: qty 14

## 2014-04-13 MED ORDER — ARIPIPRAZOLE 5 MG PO TABS
ORAL_TABLET | ORAL | Status: AC
  Administered 2014-04-13: 5 mg
  Filled 2014-04-13: qty 1

## 2014-04-13 MED ORDER — BENZTROPINE MESYLATE 0.5 MG PO TABS
0.5000 mg | ORAL_TABLET | Freq: Two times a day (BID) | ORAL | Status: DC
  Administered 2014-04-13 – 2014-04-18 (×10): 0.5 mg via ORAL
  Filled 2014-04-13 (×2): qty 1
  Filled 2014-04-13: qty 28
  Filled 2014-04-13 (×2): qty 1
  Filled 2014-04-13: qty 28
  Filled 2014-04-13 (×5): qty 1
  Filled 2014-04-13: qty 28
  Filled 2014-04-13 (×2): qty 1
  Filled 2014-04-13: qty 28
  Filled 2014-04-13 (×2): qty 1

## 2014-04-13 MED ORDER — ARIPIPRAZOLE 5 MG PO TABS
5.0000 mg | ORAL_TABLET | Freq: Two times a day (BID) | ORAL | Status: DC
  Administered 2014-04-14 – 2014-04-18 (×9): 5 mg via ORAL
  Filled 2014-04-13 (×5): qty 1
  Filled 2014-04-13 (×2): qty 28
  Filled 2014-04-13: qty 1
  Filled 2014-04-13: qty 28
  Filled 2014-04-13 (×6): qty 1
  Filled 2014-04-13: qty 28
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
## 2014-04-13 NOTE — [facility] Group Notes (Signed)
Adult Psychoeducational Group Note  Date:  04/13/2014 Time:  11:31 PM  Group Topic/Focus:  Wrap-Up Group:   The focus of this group is to help patients review their daily goal of treatment and discuss progress on daily workbooks.  Participation Level:  Minimal  Participation Quality:  Appropriate  Affect:  Flat  Cognitive:  Appropriate  Insight: Good  Engagement in Group:  Limited  Modes of Intervention:  Discussion  Additional Comments:  [name] stated his day was great.  He was in a good mood all day and  attended the groups.  He doesn't have any discharge plans yet.  Caroll RancherLindsay, Charisma Charlot A 04/13/2014, 11:31 PM

## 2014-04-13 NOTE — Progress Notes (Signed)
1:1 note- Writer observed patient up in the dayroom talking with peers. He voiced no complaints, plans to attend group this evening. Safety maintained with sitter at patient's side. Will continue to monitor.

## 2014-04-13 NOTE — Progress Notes (Signed)
Client had an episode of diarrhea this am, Imodium 4 mg administered. Upon questioning client reported he had "a couple" episodes on yesterday, but did not tell anyone.

## 2014-04-13 NOTE — Progress Notes (Signed)
Patient ID: Tyler Burgess, male   DOB: 06/20/1955, 58 y.o.   MRN: [id number] 1:1 Note D: client up to shower and change after having an episode of diarrhea. Client later revealed he had "a couple" of episodes on yesterday. A:Writer assessed for dizziness, weakness, or unsteady gait. Client denies any symptoms. Staff will maintain 1:1 for safety. R: client is safe on the unit.
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
## 2014-04-13 NOTE — [facility] Group Notes (Signed)
Adult Psychoeducational Group Note  Date:  04/13/2014 Time:  1005 Group Topic/Focus:  Relapse Prevention Planning:   The focus of this group is to define relapse and discuss the need for planning to combat relapse.  Participation Level:  Minimal  Participation Quality:  Appropriate and Drowsy  Affect:  Flat  Cognitive:  Appropriate  Insight: None  Engagement in Group:   None  Modes of Intervention:  Discussion and Education  Additional Comments:  Pt attended group but was able to stay awake  Gwenevere Ghazili, Beya Tipps Patience 04/13/2014, 4:27 PM

## 2014-04-13 NOTE — [facility] Group Notes (Signed)
[facility] LCSW Group Therapy  04/13/2014  1:05 PM  Type of Therapy:  Group therapy  Participation Level:  Active  Participation Quality:  Attentive  Affect:  Flat  Cognitive:  Oriented  Insight:  Limited  Engagement in Therapy:  Limited  Modes of Intervention:  Discussion, Socialization  Summary of Progress/Problems:  Chaplain was here to lead a group on themes of hope and courage.  "Community is a group of friends."  Sat quietly after that, often with eyes closed.  At the end, shared that ingredients of community include trust and harmony.  "That helps my anxiety go down."  Tyler Burgess, Tyler Burgess 04/13/2014 10:55 AM

## 2014-04-13 NOTE — Progress Notes (Signed)
[facility] MD Progress Note  04/13/2014 11:35 AM Tyler Burgess  MRN:  [id number] Subjective: Patient states 'I am still depressed about a lot of things.'  Objective; Tyler Burgess is an 58 y.o. Caucasian male , widowed ,employed [facility] [employer] after [facility].  Patient today reports that he continues to be depressed and anxious ,his stressors include being locked up ,stressed about his life in general and many other things. Patient continues to have  AH of "buzzing noise " in his head.  Patient was started on a small dose of Abilify last night which did not help. Discussed further increase in dose.  Patient continues to have paranoia and feels that people are watching him.Patient reports sleep as improved.  Per staff patient was suicidal yesterday on the unit,patient was found with a noose that he made out of his bed sheet and made some suicidal gestures with the same. Patient hence is currently on 1;1 precaution for safety reasons.   Patient otherwise is alert ,oriented x3, has mild BL tremors of his hands. VS reviewed -wnl    Diagnosis:   DSM5: Primary Psychiatric Diagnosis: MDD,recurrent severe without psychosis   Secondary Psychiatric Diagnosis: Bereavement Generalized anxiety disorder Alcohol use disorder ,severe Alcohol withdrawal,severe without perceptual disturbances Opioid use disorder ,moderate in sustained remission   Non Psychiatric Diagnosis: DM     Total Time spent with patient: 45 minutes   ADL's:  Intact  Sleep: Fair  Appetite:  Fair   Psychiatric Specialty Exam: Physical Exam  ROS  Blood pressure 103/73, pulse 97, temperature 98.4 F (36.9 C), temperature source Oral, resp. rate 16, height 5' 9.75" (1.772 m), weight 72.576 kg (160 lb).Body mass index is 23.11 kg/(m^2).  General Appearance: Casual  Eye Contact::  Fair  Speech:  Clear and Coherent  Volume:  Normal  Mood:   Anxious and Depressed  Affect:  Congruent  Thought Process:  Irrelevant  Orientation:  Full (Time, Place, and Person)  Thought Content:  Hallucinations: Auditory and Paranoid Ideation  Suicidal Thoughts:  Yes.  with intent/plan [facility] ,made a noose out the bed sheet . Patient continues to be depressed and anxious.  Homicidal Thoughts:  No  Memory:  Immediate;   Fair Recent;   Fair Remote;   Fair  Judgement:  Impaired  Insight:  Lacking  Psychomotor Activity:  Normal  Concentration:  Fair  Recall:  Good  Fund of Knowledge:Good  Language: Good  Akathisia:  No  Handed:  Right  AIMS (if indicated):     Assets:  Desire for Improvement  Sleep:  Number of Hours: 6   Musculoskeletal: Strength & Muscle Tone: within normal limits Gait & Station: normal Patient leans: N/A  Current Medications: Current Facility-Administered Medications  Medication Dose Route Frequency Provider Last Rate Last Dose  . acetaminophen (TYLENOL) tablet 650 mg  650 mg Oral Q6H PRN Jomarie LongsSaramma Mihika Surrette, MD      . alum & mag hydroxide-simeth (MAALOX/MYLANTA) 200-200-20 MG/5ML suspension 30 mL  30 mL Oral Q4H PRN Vernal Hritz, MD      . ARIPiprazole (ABILIFY) tablet 5 mg  5 mg Oral BID Eara Burruel, MD      . benztropine (COGENTIN) tablet 0.5 mg  0.5 mg Oral BID Zeyad Delaguila, MD      . gabapentin (NEURONTIN) capsule 100 mg  100 mg Oral TID Jomarie LongsSaramma Mikhayla Phillis, MD  100 mg at 04/13/14 0817  . hydrOXYzine (ATARAX/VISTARIL) tablet 25 mg  25 mg Oral Q6H PRN Jomarie LongsSaramma Samarion Ehle, MD   25 mg at 04/12/14 1748  . insulin aspart (novoLOG) injection 0-9 Units  0-9 Units Subcutaneous TID WC Jomarie LongsSaramma Grayson White, MD   7 Units at 04/12/14 1751  . insulin aspart (novoLOG) injection 3 Units  3 Units Subcutaneous TID WC Jomarie LongsSaramma Reshard Guillet, MD   3 Units at 04/13/14 [phone]  . insulin glargine (LANTUS) injection 15 Units  15 Units Subcutaneous QHS Jomarie LongsSaramma Devonn Giampietro, MD   15 Units at 04/12/14 2122  . loperamide  (IMODIUM) capsule 2-4 mg  2-4 mg Oral PRN Jomarie LongsSaramma Lavance Beazer, MD   4 mg at 04/13/14 0607  . LORazepam (ATIVAN) tablet 1 mg  1 mg Oral Q6H PRN Jomarie LongsSaramma Macario Shear, MD      . LORazepam (ATIVAN) tablet 1 mg  1 mg Oral BID Jomarie LongsSaramma Quamere Mussell, MD   1 mg at 04/13/14 0817   Followed by  . [START ON 04/14/2014] LORazepam (ATIVAN) tablet 1 mg  1 mg Oral Daily Chezney Huether, MD      . magnesium hydroxide (MILK OF MAGNESIA) suspension 30 mL  30 mL Oral Daily PRN Jomarie LongsSaramma Eryx Zane, MD      . multivitamin with minerals tablet 1 tablet  1 tablet Oral Daily Jomarie LongsSaramma Katielynn Horan, MD   1 tablet at 04/13/14 0817  . ondansetron (ZOFRAN-ODT) disintegrating tablet 4 mg  4 mg Oral Q6H PRN Jomarie LongsSaramma Shanyah Gattuso, MD      . Melene Muller[START ON 04/14/2014] sertraline (ZOLOFT) tablet 50 mg  50 mg Oral Daily Malikai Gut, MD      . thiamine (VITAMIN B-1) tablet 100 mg  100 mg Oral Daily Jomarie LongsSaramma Marlean Mortell, MD   100 mg at 04/13/14 0817  . traZODone (DESYREL) tablet 50 mg  50 mg Oral QHS Jomarie LongsSaramma Candyce Gambino, MD   50 mg at 04/12/14 2109    Lab Results:  Results for orders placed or performed during the hospital encounter of 04/11/14 (from the past 48 hour(s))  Glucose, capillary     Status: Abnormal   Collection Time: 04/11/14 11:52 AM  Result Value Ref Range   Glucose-Capillary 214 (H) 70 - 99 mg/dL  Glucose, capillary     Status: Abnormal   Collection Time: 04/11/14  4:39 PM  Result Value Ref Range   Glucose-Capillary 199 (H) 70 - 99 mg/dL   Comment 1 Notify RN   Glucose, capillary     Status: Abnormal   Collection Time: 04/11/14  8:29 PM  Result Value Ref Range   Glucose-Capillary 167 (H) 70 - 99 mg/dL  Glucose, capillary     Status: None   Collection Time: 04/12/14  6:14 AM  Result Value Ref Range   Glucose-Capillary 72 70 - 99 mg/dL  Hemoglobin [id number]     Status: Abnormal   Collection Time: 04/12/14  6:35 AM  Result Value Ref Range   Hgb A1c MFr Bld 9.5 (H) <5.7 %    Comment: (NOTE)                                                                        According to the ADA Clinical Practice Recommendations for 2011, when HbA1c is used as a screening  test:  >=6.5%   Diagnostic of Diabetes Mellitus           (if abnormal result is confirmed) 5.7-6.4%   Increased risk of developing Diabetes Mellitus References:Diagnosis and Classification of Diabetes Mellitus,Diabetes Care,2011,34(Suppl 1):S62-S69 and Standards of Medical Care in         Diabetes - 2011,Diabetes Care,2011,34 (Suppl 1):S11-S61.    Mean Plasma Glucose 226 (H) <117 mg/dL    Comment: Performed at [facility]  TSH     Status: None   Collection Time: 04/12/14  6:35 AM  Result Value Ref Range   TSH 3.200 0.350 - 4.500 uIU/mL    Comment: Performed at [facility]  Glucose, capillary     Status: Abnormal   Collection Time: 04/12/14  8:13 AM  Result Value Ref Range   Glucose-Capillary 134 (H) 70 - 99 mg/dL   Comment 1 Documented in Chart    Comment 2 Notify RN   Glucose, capillary     Status: Abnormal   Collection Time: 04/12/14 11:50 AM  Result Value Ref Range   Glucose-Capillary 327 (H) 70 - 99 mg/dL   Comment 1 Documented in Chart    Comment 2 Notify RN   Glucose, capillary     Status: Abnormal   Collection Time: 04/12/14  5:25 PM  Result Value Ref Range   Glucose-Capillary 312 (H) 70 - 99 mg/dL   Comment 1 Documented in Chart    Comment 2 Notify RN   Glucose, capillary     Status: Abnormal   Collection Time: 04/12/14  8:50 PM  Result Value Ref Range   Glucose-Capillary 221 (H) 70 - 99 mg/dL   Comment 1 Notify RN   Glucose, capillary     Status: Abnormal   Collection Time: 04/13/14  6:06 AM  Result Value Ref Range   Glucose-Capillary 118 (H) 70 - 99 mg/dL    Physical Findings: AIMS: Facial and Oral Movements Muscles of Facial Expression: None, normal Lips and Perioral Area: None, normal Jaw: None, normal Tongue: None, normal,Extremity Movements Upper (arms, wrists, hands, fingers): None, normal Lower (legs, knees, ankles, toes): None,  normal, Trunk Movements Neck, shoulders, hips: None, normal, Overall Severity Severity of abnormal movements (highest score from questions above): None, normal Incapacitation due to abnormal movements: None, normal Patient's awareness of abnormal movements (rate only patient's report): No Awareness,    CIWA:  CIWA-Ar Total: 6 COWS:     Treatment Plan Summary: Daily contact with patient to assess and evaluate symptoms and progress in treatment Medication management  Plan: Will increase Zoloft to 50 mg po daily.Will increase Abilify to 5 mg po bid for AH. Will continue Gabapentin 100 mg po tid for anxiety sx. Will continue CIWA/Ativan protocol for ETOH withdrawal. Will continue Trazodone 50 mg po qhs for sleep. Chaplain consult for spiritual support. DM coordinator consult for management of DM.See consult note. Fall precaution. Continue 1:1 for safety reasons.  Will continue to monitor vitals ,medication compliance and treatment side effects while patient is here.  Will monitor for medical issues as well as call consult as needed.  Reviewed labs ,TSH -wnl.  CSW will start working on disposition.  Patient to participate in therapeutic milieu .       Medical Decision Making Problem Points:  Established problem, worsening (2), Review of last therapy session (1) and Review of psycho-social stressors (1) Data Points:  Review of medication regiment & side effects (2) Review of new medications or change in dosage (2)  I certify that inpatient services furnished can reasonably be expected to improve the patient's condition.   Brandyn Lowrey MD 04/13/2014, 11:35 AM

## 2014-04-14 DIAGNOSIS — F1121 Opioid dependence, in remission: Secondary | ICD-10-CM

## 2014-04-14 DIAGNOSIS — F1020 Alcohol dependence, uncomplicated: Secondary | ICD-10-CM

## 2014-04-14 LAB — GLUCOSE, CAPILLARY
GLUCOSE-CAPILLARY: 88 mg/dL (ref 70–99)
Glucose-Capillary: 65 mg/dL — ABNORMAL LOW (ref 70–99)
Glucose-Capillary: 147 mg/dL — ABNORMAL HIGH (ref 70–99)
Glucose-Capillary: 299 mg/dL — ABNORMAL HIGH (ref 70–99)
Glucose-Capillary: 253 mg/dL — ABNORMAL HIGH (ref 70–99)

## 2014-04-14 NOTE — Plan of Care (Signed)
Problem: Diagnosis: Increased Risk For Suicide Attempt Goal: STG-Patient Will Comply With Medication Regime Outcome: Progressing Patient is compliant with scheduled medications.     

## 2014-04-14 NOTE — Progress Notes (Signed)
Writer observed patient in the dayroom attending group this evening and has not had a opportunity to speak with patient due to another situation with another patient Erin Fulling(Erica Williams) and her family members. Patient remains safe with sitter at his side, 1:1 continues. Writer will speak to patient after group.

## 2014-04-14 NOTE — Progress Notes (Signed)
Patient ID: Tyler LoganBilly Huizenga, male   DOB: 11/26/1955, 58 y.o.   MRN: [id number] 1:1 Note- Pt being monitored for safety after an attempt at self harm actions on 04/14/2014. Pt is sitting in group, actively talking. Sitter is sitting close to patient in the dayroom. Pt remains safe at this time.

## 2014-04-14 NOTE — Progress Notes (Signed)
Patient ID: Tyler Burgess Spees, male   DOB: 09/26/1955, 58 y.o.   MRN: [id number] [facility]  04/14/2014 4:21 PM Tyler Burgess Naugle  MRN:  [id number] Subjective: Patient states 'I am still depressed about a lot of things.'  Objective; Tyler Burgess Carberry is an 58 y.o. Caucasian male , widowed ,employed [facility] [employer] office after he [facility] BB [facility].  Patient today reports that he continues to be depressed and anxious ,his stressors include being locked up ,stressed about his life in general and many other things. Patient continues to have  AH of "buzzing noise " in his head.  Patient was started on a small dose of Abilify last night which did not help. Discussed further increase in dose.  Patient continues to have paranoia and feels that people are watching him.Patient reports sleep as improved.  Per staff patient was suicidal yesterday on the unit,patient was found with a noose that he made out of his bed sheet and made some suicidal gestures with the same. Patient hence is currently on 1:1 precaution for safety reasons.  Patient otherwise is alert ,oriented x3, has mild BL tremors of his hands. 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 Psychiatric: His behavior is normal.    Review of Systems  Constitutional: Negative.   HENT: Negative.   Eyes: Negative.   Respiratory: Negative.   Cardiovascular: Negative.   Gastrointestinal: Negative.   Genitourinary:  Negative.   Musculoskeletal: Negative.   Skin: Negative.   Neurological: Negative.   Endo/Heme/Allergies: Negative.   Psychiatric/Behavioral: Positive for depression and substance abuse. Negative for suicidal ideas, hallucinations and memory loss. The patient is not nervous/anxious and does not have insomnia.     Blood pressure 116/70, pulse 76, temperature 97.8 F (36.6 C), temperature source Oral, resp. rate 18, height 5' 9.75" (1.772 m), weight 72.576 kg (160 lb).Body mass index is 23.11 kg/(m^2).  General Appearance: Casual  Eye Contact::  Fair  Speech:  Clear and Coherent  Volume:  Normal  Mood:  Anxious and Depressed  Affect:  Congruent  Thought Process:  Irrelevant  Orientation:  Full (Time, Place, and Person)  Thought Content:  Hallucinations: Auditory and Paranoid Ideation  Suicidal Thoughts:  Yes.  with intent/plan [facility] ,made a noose out the bed sheet . Patient continues to be depressed and anxious.  Homicidal Thoughts:  No  Memory:  Immediate;   Fair Recent;   Fair Remote;   Fair  Judgement:  Impaired  Insight:  Lacking  Psychomotor Activity:  Normal  Concentration:  Fair  Recall:  Good  Fund of Knowledge:Good  Language: Good  Akathisia:  No  Handed:  Right  AIMS (if indicated):     Assets:  Desire for Improvement  Sleep:  Number of Hours: 6.5   Musculoskeletal: Strength & Muscle Tone: within normal limits Gait & Station: normal Patient leans: N/A  Current Medications: Current Facility-Administered Medications  Medication Dose Route Frequency Provider Last Rate Last Dose  . acetaminophen (TYLENOL) tablet 650 mg  650 mg Oral Q6H PRN Jomarie Longs, MD      . alum & mag hydroxide-simeth (MAALOX/MYLANTA) 200-200-20 MG/5ML suspension 30 mL  30 mL Oral Q4H PRN Saramma Eappen, MD      . ARIPiprazole (ABILIFY) tablet 5 mg  5 mg Oral BID Jomarie Longs, MD   5 mg at 04/14/14 0817  . benztropine (COGENTIN) tablet 0.5  mg  0.5 mg Oral BID Jomarie Longs, MD   0.5 mg at 04/14/14 0818  . gabapentin (NEURONTIN) capsule 100 mg  100 mg Oral TID Jomarie Longs, MD   100 mg at 04/14/14 1234  . insulin aspart (novoLOG) injection 0-9 Units  0-9 Units Subcutaneous TID WC Jomarie Longs, MD   5 Units at 04/14/14 1233  . insulin aspart (novoLOG) injection 3 Units  3 Units Subcutaneous TID WC Jomarie Longs, MD   3 Units at 04/14/14 1233  . insulin glargine (LANTUS) injection 15 Units  15 Units Subcutaneous QHS Jomarie Longs, MD   15 Units at 04/13/14 2128  . magnesium hydroxide (MILK OF MAGNESIA) suspension 30 mL  30 mL Oral Daily PRN Jomarie Longs, MD      . multivitamin with minerals tablet 1 tablet  1 tablet Oral Daily Jomarie Longs, MD   1 tablet at 04/14/14 0817  . sertraline (ZOLOFT) tablet 50 mg  50 mg Oral Daily Jomarie Longs, MD   50 mg at 04/14/14 0818  . thiamine (VITAMIN B-1) tablet 100 mg  100 mg Oral Daily Jomarie Longs, MD   100 mg at 04/14/14 0818  . traZODone (DESYREL) tablet 50 mg  50 mg Oral QHS Jomarie Longs, MD   50 mg at 04/13/14 2126    Lab Results:  Results for orders placed or performed during the hospital encounter of 04/11/14 (from the past 48 hour(s))  Glucose, capillary     Status: Abnormal   Collection Time: 04/12/14  5:25 PM  Result Value Ref Range   Glucose-Capillary 312 (H) 70 - 99 mg/dL   Comment 1 Documented in Chart    Comment 2 Notify RN   Glucose, capillary     Status: Abnormal   Collection Time: 04/12/14  8:50 PM  Result Value Ref Range   Glucose-Capillary 221 (H) 70 - 99 mg/dL   Comment 1 Notify RN   Glucose, capillary     Status: Abnormal   Collection Time: 04/13/14  6:06 AM  Result Value Ref Range   Glucose-Capillary 118 (H) 70 - 99 mg/dL  Glucose, capillary     Status: Abnormal   Collection Time: 04/13/14 11:37 AM  Result Value Ref Range   Glucose-Capillary 246 (H) 70 - 99 mg/dL   Comment 1 Documented in Chart    Comment 2 Notify RN   Glucose, capillary      Status: Abnormal   Collection Time: 04/13/14  5:09 PM  Result Value Ref Range   Glucose-Capillary 365 (H) 70 - 99 mg/dL  Glucose, capillary     Status: Abnormal   Collection Time: 04/13/14  8:34 PM  Result Value Ref Range   Glucose-Capillary 101 (H) 70 - 99 mg/dL   Comment 1 Notify RN    Comment 2 Documented in Chart   Glucose, capillary     Status: Abnormal   Collection Time: 04/14/14  6:22 AM  Result Value Ref Range   Glucose-Capillary 65 (L) 70 - 99 mg/dL  Glucose, capillary     Status: None   Collection Time: 04/14/14  6:42 AM  Result Value  Ref Range   Glucose-Capillary 88 70 - 99 mg/dL  Glucose, capillary     Status: Abnormal   Collection Time: 04/14/14  8:08 AM  Result Value Ref Range   Glucose-Capillary 147 (H) 70 - 99 mg/dL    Physical Findings: AIMS: Facial and Oral Movements Muscles of Facial Expression: None, normal Lips and Perioral Area: None, normal Jaw: None, normal Tongue: None, normal,Extremity Movements Upper (arms, wrists, hands, fingers): None, normal Lower (legs, knees, ankles, toes): None, normal, Trunk Movements Neck, shoulders, hips: None, normal, Overall Severity Severity of abnormal movements (highest score from questions above): None, normal Incapacitation due to abnormal movements: None, normal Patient's awareness of abnormal movements (rate only patient's report): No Awareness,    CIWA:  CIWA-Ar Total: 0 COWS:     Treatment Plan Summary: Daily contact with patient to assess and evaluate symptoms and progress in treatment Medication management  Plan: Will increase Zoloft to 50 mg po daily.Will increase Abilify to 5 mg po bid for AH. Will continue Gabapentin 100 mg po tid for anxiety sx. Will continue CIWA/Ativan protocol for ETOH withdrawal. Will continue Trazodone 50 mg po qhs for sleep. Chaplain consult for spiritual support. DM coordinator consult for management of DM.See consult note. Fall precaution. Continue 1:1 for safety  reasons.  Will continue to monitor vitals ,medication compliance and treatment side effects while patient is here.  Will monitor for medical issues as well as call consult as needed.  Reviewed labs ,TSH -wnl.  CSW will start working on disposition.  Patient to participate in therapeutic milieu .   Medical Decision Making Problem Points:  Established problem, worsening (2), Review of last therapy session (1) and Review of psycho-social stressors (1) Data Points:  Review of medication regiment & side effects (2) Review of new medications or change in dosage (2)  I certify that inpatient services furnished can reasonably be expected to improve the patient's condition.   Adonis BrookGUSTIN, SHEILA MAY, AGNP-BC 04/14/2014, 4:21 PM I agree with assessment and plan Madie Renorving A. Dub MikesLugo, M.D.

## 2014-04-14 NOTE — Progress Notes (Signed)
1:1 Note- Patient took his scheduled hs medications before going to bed and is currently lying in bed asleep with eyes closed and respirations even and unlabored. Safety maintained and 1:1 continues with sitter at patients  bedside.

## 2014-04-14 NOTE — Plan of Care (Signed)
Problem: Ineffective individual coping Goal: STG: Patient will remain free from self harm Outcome: Progressing Pt remains safe on a 1:1 order

## 2014-04-14 NOTE — Progress Notes (Addendum)
Hypoglycemic Event  CBG:65  Treatment: 1 tube instant glucose and 15 GM carbohydrate snack  Symptoms: Shaky  Follow-up CBG: Time 0643 CBG Result:88  Possible Reasons for Event: Unknown  Comments/MD notified:  N. Mashburn notified, 3 units novolog to be given after patient eats breakfast, continue to monitor.   Tyler Burgess, Tyler Burgess  Remember to initiate Hypoglycemia Order Set & complete

## 2014-04-14 NOTE — Progress Notes (Signed)
Patient ID: Tyler Burgess, male   DOB: 10/27/1955, 58 y.o.   MRN: [id number] 1:1 Note - Pt is currently sitting in the dayroom. Pt is not in any distress and remains safe at this time. Sitter is next to pt in the dayroom also.

## 2014-04-14 NOTE — Progress Notes (Signed)
Patient ID: Tyler LoganBilly Burgess, male   DOB: 10/11/1955, 58 y.o.   MRN: [id number] 1:1 Note - Pt being monitored for safety after an attempt at self harm actions on 04/14/2014. Pt eating breakfast tray in the chair in front of the nursing station. Sitter next to pt in the hallway. Pt remains safe at this time.

## 2014-04-14 NOTE — Progress Notes (Signed)
1:1 Note- Patient had been up earlier and c/o loose stool and received a dose of imodium. He had been sleeping and appeared to be resting comfortably. Patient remains safe and 1:1 continues with sitter at bedside.

## 2014-04-14 NOTE — Progress Notes (Signed)
Inpatient Diabetes Program Recommendations  AACE/ADA: New Consensus Statement on Inpatient Glycemic Control (2013)  Target Ranges:  Prepandial:   less than 140 mg/dL      Peak postprandial:   less than 180 mg/dL (1-2 hours)      Critically ill patients:  140 - 180 mg/dL  Results for Tyler Burgess, Tyler Burgess (MRN [id number]) as of 04/14/2014 13:04  Ref. Range 04/13/2014 20:34 04/14/2014 06:22 04/14/2014 06:42 04/14/2014 08:08  Glucose-Capillary Latest Range: 70-99 mg/dL 295101 (H) 65 (L) 88 284147 (H)    Decrease Lantus to 10 units. Thank you  Piedad ClimesGina Quade Ramirez BSN, RN,CDE Inpatient Diabetes Coordinator [phone] (team pager)

## 2014-04-14 NOTE — [facility] Group Notes (Signed)
[facility] LCSW Group Therapy Note  04/14/2014 11:15 AM   Type of Therapy and Topic:  Group Therapy: Avoiding Self-Sabotaging and Enabling Behaviors  Participation Level:  Minimal  Mood: Depressed  Description of Group:     Learn how to identify obstacles, self-sabotaging and enabling behaviors, what are they, why do we do them and what needs do these behaviors meet? Discuss unhealthy relationships and how to have positive healthy boundaries with those that sabotage and enable. Explore aspects of self-sabotage and enabling in yourself and how to limit these self-destructive behaviors in everyday life. A scaling question is used to help patient look at where they are now in their motivation to change, from 1 to 10 (lowest to highest motivation).  Therapeutic Goals: 1. Patient will identify one obstacle that relates to self-sabotage and enabling behaviors 2. Patient will identify one personal self-sabotaging or enabling behavior they did prior to admission 3. Patient able to establish a plan to change the above identified behavior they did prior to admission:  4. Patient will demonstrate ability to communicate their needs through discussion and/or role plays.   Summary of Patient Progress:  Summary of Progress/Problems: The main focus of today's process group was for the patient to identify ways in which they have in the past sabotaged their own recovery. Motivational Interviewing was utilized to ask the group members what they get out of their substance use, isolation, noncompliance, self harm, negative self talk, etc and what reasons they may have for wanting to change. The Stages of Change were explained using a handout, and patients identified where they currently are with regard to stages of change. The patient expressed that he self sabotages by procrastination and he feels quite hopeless about this due to recent losses of family members, job and home.Marland Kitchen. He reports he is in between Action and  Maintenance and due to past experience believes it will be of importance to take immediate action on finding new residence.     Therapeutic Modalities:   Cognitive Behavioral Therapy Person-Centered Therapy Motivational Interviewing   Carney Bernatherine C Autry Prust, LCSW

## 2014-04-14 NOTE — Progress Notes (Addendum)
Patient ID: Tyler Burgess, male   DOB: 01/29/1956, 58 y.o.   MRN: [id number]  Pt currently presents with a flat affect and anxious behavior. Pt has been pacing the halls throughout the day. Per self inventory, pt rates depression at a 5, hopelessness 5 and anxiety 10. Pt's daily goal is to "control me and others with my mind and thoughts, to stay calm" and they intend to do so by "just be kind." Pt reports sleep, good concentration, low energy and a good appetite.   Pt provided with medications per providers orders. Pt's labs and vitals were monitored throughout the day. Pt supported emotionally and encouraged to express concerns and questions. Pt consulted with Clinical research associatewriter today. Pt educated on anxiety medication, coping skills and medications.   Pt's safety ensured with 15 minute and environmental checks. Pt currently denies HI and A/V hallucinations. Pt has passive SI. Pt verbally agrees to seek staff if SI/HI or A/VH occurs and to consult with staff before acting on these thoughts.   Pt gave Clinical research associatewriter a note today that read "This is just like [facility]. You never see a doctor and when you do it's only 5 mins and the already know every about how to fix you. Well it's not working. I have seen a few ways that I can end this and you won't even find me. I don't feel liking killing myself but it does cross my mind and that's why I hate Docs. Tell the don't bother me and send me back to jail. I don't not want to talk to the ____! If I do die my family knows what to do with the body." Pt maintains safety on a 1:1 order. Pt consulted about healthy coping skills.

## 2014-04-14 NOTE — Progress Notes (Signed)
[facility] Group Notes:  (Nursing/MHT/Case Management/Adjunct)  Date:  04/14/2014  Time:  11:40 PM  Type of Therapy:  Group Therapy  Participation Level:  Active  Participation Quality:  Appropriate  Affect:  Appropriate  Cognitive:  Appropriate  Insight:  Appropriate  Engagement in Group:  Engaged  Modes of Intervention:  Socialization and Support  Summary of Progress/Problems: Pt. Was engaged in group activity and discussion. Pt. Was able to identify a healthy coping skill.  Pt. Stated taking a walk was his healthy coping skill.  Sondra ComeWilson, Pualani Borah J 04/14/2014, 11:40 PM

## 2014-04-15 DIAGNOSIS — E1165 Type 2 diabetes mellitus with hyperglycemia: Secondary | ICD-10-CM

## 2014-04-15 DIAGNOSIS — F332 Major depressive disorder, recurrent severe without psychotic features: Principal | ICD-10-CM

## 2014-04-15 LAB — BASIC METABOLIC PANEL
ANION GAP: 11 (ref 5–15)
BUN: 12 mg/dL (ref 6–23)
CHLORIDE: 105 meq/L (ref 96–112)
CO2: 23 meq/L (ref 19–32)
Calcium: 8.5 mg/dL (ref 8.4–10.5)
Creatinine, Ser: 1.48 mg/dL — ABNORMAL HIGH (ref 0.50–1.35)
GFR calc Af Amer: 59 mL/min — ABNORMAL LOW (ref >90)
GFR calc non Af Amer: 51 mL/min — ABNORMAL LOW (ref >90)
GLUCOSE: 370 mg/dL — AB (ref 70–99)
POTASSIUM: 4.5 meq/L (ref 3.7–5.3)
SODIUM: 139 meq/L (ref 137–147)

## 2014-04-15 LAB — GLUCOSE, CAPILLARY
GLUCOSE-CAPILLARY: 394 mg/dL — AB (ref 70–99)
GLUCOSE-CAPILLARY: 200 mg/dL — AB (ref 70–99)
GLUCOSE-CAPILLARY: 362 mg/dL — AB (ref 70–99)
Glucose-Capillary: 161 mg/dL — ABNORMAL HIGH (ref 70–99)

## 2014-04-15 MED ORDER — INSULIN ASPART 100 UNIT/ML ~~LOC~~ SOLN
5.0000 [IU] | Freq: Three times a day (TID) | SUBCUTANEOUS | Status: DC
  Administered 2014-04-15 – 2014-04-16 (×4): 5 [IU] via SUBCUTANEOUS

## 2014-04-15 MED ORDER — HYDROXYZINE HCL 25 MG PO TABS
25.0000 mg | ORAL_TABLET | Freq: Three times a day (TID) | ORAL | Status: DC | PRN
  Administered 2014-04-15 – 2014-04-18 (×7): 25 mg via ORAL
  Filled 2014-04-15 (×9): qty 1

## 2014-04-15 MED ORDER — INSULIN GLARGINE 100 UNIT/ML ~~LOC~~ SOLN
20.0000 [IU] | Freq: Every day | SUBCUTANEOUS | Status: DC
  Administered 2014-04-15 – 2014-04-17 (×3): 20 [IU] via SUBCUTANEOUS

## 2014-04-15 MED ORDER — INSULIN ASPART 100 UNIT/ML ~~LOC~~ SOLN
0.0000 [IU] | Freq: Three times a day (TID) | SUBCUTANEOUS | Status: DC
  Administered 2014-04-15: 3 [IU] via SUBCUTANEOUS
  Administered 2014-04-16: 8 [IU] via SUBCUTANEOUS
  Administered 2014-04-16: 2 [IU] via SUBCUTANEOUS
  Administered 2014-04-16: 15 [IU] via SUBCUTANEOUS

## 2014-04-15 MED ORDER — LOPERAMIDE HCL 2 MG PO CAPS
2.0000 mg | ORAL_CAPSULE | ORAL | Status: DC | PRN
  Administered 2014-04-15: 2 mg via ORAL
  Filled 2014-04-15: qty 1
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
## 2014-04-15 NOTE — Progress Notes (Signed)
Patient ID: Tyler Burgess, male   DOB: 12/13/1955, 58 y.o.   MRN: [id number]   [facility] Documentation Initial Note   For the first (8) hours following discontinuation of 1:1 precautions, a progress note entry by nursing staff should be documented at least every 2 hours, reflecting the patient's behavior, condition, mood, and conversation.  Use the progress notes for additional entries.  Time 1:1 discontinued:  04/15/2014 -  1250  Patient's Behavior:  Pt is currently sitting in dayroom laughing and smiling at a movie on television.   Patient's Condition:  Pt is relaxed and stable at this time.   Patient's Conversation:  Pt says "I feel better." Pt currently denies SI.  Pt verbally agrees to contact staff before acting upon any SI thoughts. Pt expresses alternative coping skills he can use if SI occur.   Aurora Maskwyman, Curt Oatis E 04/15/2014, 1:13 PM

## 2014-04-15 NOTE — Progress Notes (Signed)
Writer has observed patient up in the dayroom watching the football game and interacting appropriately with peers. He reports that he was glad to be off the 1:1. He reports being able to go to meals and not having someone watching him all the time felt good. He denies si/hi/a/v hallucinations. Support and encouragement given, safety maintained on unit with 15 min checks.

## 2014-04-15 NOTE — Progress Notes (Signed)
Writer spoke with patient and he reports that his day has been good but he does not like not being able to go to the cafeteria or outside because he is on the 1:1. Writer explained to him the reason he was placed on the 1:1 d/t the noose he made and how it was taken as a suicide attempt or threat to commit suicide. Patient reported that the note he wrote on today was because he was so upset about his life. He reports that he is currently homeless and live out of his truck in his aunt yard and she lets him come in to shower from time to time. He reports that his aunts home is small and she has other family that live with her along with her 3 dogs. He reports that he had looked into going to a shelter but would have to pay 300 dollars and give them his food stamps.Patient reports that he has also applied for housing assistance but he is on a waiting list. Patient shares his frustrations and how he does not know what else to do concerning his situation which causes him to lose sleep and his depression worsens. Writer listened to patient and encouraged him not to give up on himselfd/t his current situation. Patient repors passive si and verbally contracts for safety, he denies hi/a/v hallucinations. Safety maintained and 1:1 continues for safety.

## 2014-04-15 NOTE — Plan of Care (Signed)
Problem: Diagnosis: Increased Risk For Suicide Attempt Goal: STG-Patient Will Comply With Medication Regime Outcome: Progressing Patient is compliant with medication regime.     

## 2014-04-15 NOTE — [facility] Group Notes (Signed)
[facility] LCSW Group Therapy  04/15/2014 12:33 PM  Type of Therapy:  Group Therapy  Participation Level:  Active  Participation Quality:  Appropriate, Sharing and Supportive  Affect:  Appropriate  Cognitive:  Appropriate and Oriented  Insight:  Developing/Improving, Engaged and Supportive  Engagement in Therapy:  Developing/Improving, Engaged and Supportive  Modes of Intervention:  Discussion, Education, Exploration and Support  Summary of Progress/Problems: Pt was able to engage and give personal examples of self sabotaging behaviors. Pt was supportive of other people sharing and was able to identify some positive coping skills.  Tyler Burgess, Tyler Burgess 04/15/2014, 12:33 PM

## 2014-04-15 NOTE — Progress Notes (Signed)
[facility] Post 1:1 Observation Documentation  For the first (8) hours following discontinuation of 1:1 precautions, a progress note entry by nursing staff should be documented at least every 2 hours, reflecting the patient's behavior, condition, mood, and conversation.  Use the progress notes for additional entries.  Time 1:1 discontinued:  1250  Patient's Behavior:  Pt is watching television in the dayroom.   Patient's Condition: Pt is smiling and stable.   Patient's Conversation:  Pt states "I am feeling a little anxious." The provider was consulted and prn anxiety medication ordered. Pt says "I'm okay."  Aurora Maskwyman, Cheryle Dark E 04/15/2014, 3:30 PM

## 2014-04-15 NOTE — Progress Notes (Signed)
1:1 Note- Patient currently lying in bed asleep with eyes closed and respirations even and unlabored, no distress noted. 1:1 continues and patient is safe with sitter at bedside, will continue to monitor.

## 2014-04-15 NOTE — Progress Notes (Signed)
1:1 Late entry- Patient having difficulty falling back to sleep so he has been walking the hallway. He voiced no other complaints and remains safe with sitter observing him walk the hall. 1:1 continues and patient is safe.

## 2014-04-15 NOTE — Progress Notes (Signed)
Patient ID: Tyler Burgess, male   DOB: 10/25/1955, 58 y.o.   MRN: [id number] Adult Psychoeducational Group Note  Date:  04/15/2014 Time:  9:52 AM  Group Topic/Focus:  Healthy Communication:   The focus of this group is to discuss communication, barriers to communication, as well as healthy ways to communicate with others.  Participation Level:  Active  Participation Quality:  Appropriate  Affect:  Appropriate  Cognitive:  Alert  Insight: Appropriate  Engagement in Group:  Engaged  Modes of Intervention:  Discussion, Education and Exploration  Additional Comments:  Pt attended group. Pt will identify one daily goal.   Aurora Maskwyman, Chrsitopher Wik E 04/15/2014, 9:52 AM

## 2014-04-15 NOTE — Progress Notes (Signed)
Patient ID: Tyler Burgess, male   DOB: 05/29/1955, 58 y.o.   MRN: [id number]   Pt is at the nurses station speaking with the nurse. Pt currently denies active SI. Pt states "I'm good." Pt's affect is appropriate and his mood is pleasant. PT given an opportunity to ask questions and voice concerns.   Aurora Maskwyman, Opal Dinning E, RN

## 2014-04-15 NOTE — Progress Notes (Signed)
Patient ID: Tyler Burgess, male   DOB: 12/17/1955, 58 y.o.   MRN: [id number]  Pt currently presents with a pleasant affect and behavior. Per self inventory, pt rates depression at a 4, hopelessness 0 and anxiety 4. Pt's daily goal is to "look to the future. Think more on future and less on past" and they intend to do so by "talk to people, attend group and work on puzzle." Pt is currently on a 1:1.  Pt provided with medications per providers orders. Pt's labs and vitals were monitored throughout the day. Pt supported emotionally and encouraged to express concerns and questions. Pt consulted with provider and Clinical research associatewriter.  Pt's safety ensured with 15 minute and environmental checks. Pt currently denies SI/HI and A/V hallucinations. Pt verbally agrees to seek staff if SI/HI or A/VH occurs and to consult with staff before acting on these thoughts.

## 2014-04-15 NOTE — Progress Notes (Signed)
Patient ID: Tyler Burgess, male   DOB: 04/25/1956, 58 y.o.   MRN: [id number] Patient ID: Tyler LoganBilly Burgess, male   DOB: 01/31/1956, 58 y.o.   MRN: [id number] [facility]  04/15/2014 12:11 PM Tyler Burgess  MRN:  [id number] Subjective: Patient states, "my sugar is high today"  Objective; Tyler Burgess is a 58 y.[facility]. [facility].  Patient today reports that he is slightly better but continues to be depressed and anxious.  His blood sugar was 394 and he states that this is the highest it's been. Patient yesterday reported "buzzing sounds".  Denies any noises, paranoia today.  He is tolerating Abilify.  Made good eye contact.  Requesting to be able to join the group more.  Alert and oriented.  Discontinuing 1:1 sitter.  Spoke with [facility] Dr Tat and consult/recommendations in place to help further mange DM.   Patient continues to have paranoia and feels that people are watching him.  Patient reports sleep as improved.    Diagnosis:   DSM5: Primary Psychiatric Diagnosis: MDD,recurrent severe without psychosis  Secondary Psychiatric Diagnosis: Bereavement Generalized anxiety disorder Alcohol use disorder ,severe Alcohol withdrawal,severe without perceptual disturbances Opioid use disorder ,moderate in sustained remission   Non Psychiatric Diagnosis: DM  Total Time spent with patient: 45 minutes  ADL's:  Intact  Sleep: Fair  Appetite:  Fair  Psychiatric Specialty Exam: Physical Exam  Vitals reviewed. Psychiatric: His behavior is normal.    Review of Systems  Constitutional: Negative.   HENT: Negative.   Eyes: Negative.   Respiratory: Negative.   Cardiovascular: Negative.   Gastrointestinal: Negative.   Genitourinary: Negative.   Musculoskeletal: Negative.   Skin: Negative.   Neurological: Negative.   Endo/Heme/Allergies: Negative.   Psychiatric/Behavioral: Positive for depression  and substance abuse. Negative for suicidal ideas, hallucinations and memory loss. The patient is not nervous/anxious and does not have insomnia.     Blood pressure 116/70, pulse 76, temperature 98.7 F (37.1 C), temperature source Oral, resp. rate 18, height 5' 9.75" (1.772 m), weight 72.576 kg (160 lb).Body mass index is 23.11 kg/(m^2).  General Appearance: Casual  Eye Contact::  Fair  Speech:  Clear and Coherent  Volume:  Normal  Mood:  Anxious and Depressed  Affect:  Congruent  Thought Process:  Irrelevant  Orientation:  Full (Time, Place, and Person)  Thought Content:  Hallucinations: Auditory and Paranoid Ideation  Suicidal Thoughts:  Yes.  with intent/plan Patient made some suicidal gestures [facility] ,made a noose out the bed sheet . Patient continues to be depressed and anxious.  Homicidal Thoughts:  No  Memory:  Immediate;   Fair Recent;   Fair Remote;   Fair  Judgement:  Impaired  Insight:  Lacking  Psychomotor Activity:  Normal  Concentration:  Fair  Recall:  Good  Fund of Knowledge:Good  Language: Good  Akathisia:  No  Handed:  Right  AIMS (if indicated):     Assets:  Desire for Improvement  Sleep:  Number of Hours: 4.75   Musculoskeletal: Strength & Muscle Tone: within normal limits Gait & Station: normal Patient leans: N/A  Current Medications: Current Facility-Administered Medications  Medication Dose Route Frequency Provider Last Rate Last Dose  . acetaminophen (TYLENOL) tablet 650 mg  650 mg Oral Q6H PRN Jomarie LongsSaramma Eappen, MD      . alum & mag hydroxide-simeth (MAALOX/MYLANTA) 200-200-20 MG/5ML suspension 30 mL  30 mL Oral Q4H PRN Jomarie Longs, MD      . ARIPiprazole (ABILIFY) tablet 5 mg  5 mg Oral BID Jomarie Longs, MD   5 mg at 04/15/14 0804  . benztropine (COGENTIN) tablet 0.5 mg  0.5 mg Oral BID Jomarie Longs, MD   0.5 mg at 04/15/14 0803  . gabapentin (NEURONTIN) capsule 100 mg  100 mg Oral TID Jomarie Longs, MD   100 mg at 04/15/14 0803   . insulin aspart (novoLOG) injection 0-9 Units  0-9 Units Subcutaneous TID WC Jomarie Longs, MD   2 Units at 04/15/14 0626  . insulin aspart (novoLOG) injection 3 Units  3 Units Subcutaneous TID WC Jomarie Longs, MD   3 Units at 04/15/14 6295  . insulin glargine (LANTUS) injection 15 Units  15 Units Subcutaneous QHS Jomarie Longs, MD   15 Units at 04/14/14 2107  . loperamide (IMODIUM) capsule 2 mg  2 mg Oral PRN Court Joy, PA-C   2 mg at 04/15/14 2841  . magnesium hydroxide (MILK OF MAGNESIA) suspension 30 mL  30 mL Oral Daily PRN Jomarie Longs, MD      . multivitamin with minerals tablet 1 tablet  1 tablet Oral Daily Jomarie Longs, MD   1 tablet at 04/15/14 0803  . sertraline (ZOLOFT) tablet 50 mg  50 mg Oral Daily Jomarie Longs, MD   50 mg at 04/15/14 0803  . thiamine (VITAMIN B-1) tablet 100 mg  100 mg Oral Daily Jomarie Longs, MD   100 mg at 04/15/14 0802  . traZODone (DESYREL) tablet 50 mg  50 mg Oral QHS Jomarie Longs, MD   50 mg at 04/14/14 2107    Lab Results:  Results for orders placed or performed during the hospital encounter of 04/11/14 (from the past 48 hour(s))  Glucose, capillary     Status: Abnormal   Collection Time: 04/13/14  5:09 PM  Result Value Ref Range   Glucose-Capillary 365 (H) 70 - 99 mg/dL  Glucose, capillary     Status: Abnormal   Collection Time: 04/13/14  8:34 PM  Result Value Ref Range   Glucose-Capillary 101 (H) 70 - 99 mg/dL   Comment 1 Notify RN    Comment 2 Documented in Chart   Glucose, capillary     Status: Abnormal   Collection Time: 04/14/14  6:22 AM  Result Value Ref Range   Glucose-Capillary 65 (L) 70 - 99 mg/dL  Glucose, capillary     Status: None   Collection Time: 04/14/14  6:42 AM  Result Value Ref Range   Glucose-Capillary 88 70 - 99 mg/dL  Glucose, capillary     Status: Abnormal   Collection Time: 04/14/14  8:08 AM  Result Value Ref Range   Glucose-Capillary 147 (H) 70 - 99 mg/dL  Glucose, capillary     Status: Abnormal    Collection Time: 04/14/14  4:41 PM  Result Value Ref Range   Glucose-Capillary 299 (H) 70 - 99 mg/dL   Comment 1 Notify RN   Glucose, capillary     Status: Abnormal   Collection Time: 04/14/14  8:48 PM  Result Value Ref Range   Glucose-Capillary 253 (H) 70 - 99 mg/dL   Comment 1 Notify RN   Glucose, capillary     Status: Abnormal   Collection Time: 04/15/14  6:22 AM  Result Value Ref Range   Glucose-Capillary 161 (H) 70 - 99 mg/dL   Comment 1 Notify RN     Physical Findings: AIMS: Facial  and Oral Movements Muscles of Facial Expression: None, normal Lips and Perioral Area: None, normal Jaw: None, normal Tongue: None, normal,Extremity Movements Upper (arms, wrists, hands, fingers): None, normal Lower (legs, knees, ankles, toes): None, normal, Trunk Movements Neck, shoulders, hips: None, normal, Overall Severity Severity of abnormal movements (highest score from questions above): None, normal Incapacitation due to abnormal movements: None, normal Patient's awareness of abnormal movements (rate only patient's report): No Awareness,    CIWA:  CIWA-Ar Total: 2 COWS:     Treatment Plan Summary: Daily contact with patient to assess and evaluate symptoms and progress in treatment Medication management  Plan: Will increase Zoloft to 50 mg po daily.Will increase Abilify to 5 mg po bid for AH. Will continue Gabapentin 100 mg po tid for anxiety sx. Will continue CIWA/Ativan protocol for ETOH withdrawal. Will continue Trazodone 50 mg po qhs for sleep. Chaplain consult for spiritual support. DM coordinator consult for management of DM.  Pending Placed call for further management of DM.   Fall precaution. Continue 1:1 for safety reasons. Will continue to monitor vitals ,medication compliance and treatment side effects while patient is here.  CSW will start working on disposition.  Patient to participate in therapeutic milieu .   Medical Decision Making Problem Points:   Established problem, worsening (2), Review of last therapy session (1) and Review of psycho-social stressors (1) Data Points:  Review of medication regiment & side effects (2) Review of new medications or change in dosage (2)  I certify that inpatient services furnished can reasonably be expected to improve the patient's condition.   Adonis BrookGUSTIN, SHEILA MAY, AGNP-BC 04/15/2014, 12:11 PM I agree with assessment and plan Madie Renorving A. Dub MikesLugo, M.D.

## 2014-04-15 NOTE — Progress Notes (Signed)
Adult Psychoeducational Group Note  Date:  04/15/2014 Time:  11:22 PM  Group Topic/Focus:  Wrap-Up Group:   The focus of this group is to help patients review their daily goal of treatment and discuss progress on daily workbooks.  Participation Level:  Active  Participation Quality:  Appropriate  Affect:  Appropriate  Cognitive:  Appropriate  Insight: Appropriate  Engagement in Group:  Engaged  Modes of Intervention:  Activity and Socialization  Additional Comments:  Pt stated his goal for today was to go to groups and to talk more which he was able to do. Pt stated one positive thing about today is that he was able to have a good conversation with his aunt on the phone.  Caswell CorwinOwen, Lymon Kidney C 04/15/2014, 11:22 PM

## 2014-04-15 NOTE — Progress Notes (Signed)
Patient ID: Tyler Burgess, male   DOB: 06/10/1955, 58 y.o.   MRN: [id number]  Pt is currently standing in the hallway laughing and talking with a staff member. Pt is stable and safe at this time. Pt states that he is doing better and that he ate "a chicken patty and rice." Pt states that he enjoyed going to the cafeteria for dinner.   Tyler Burgess, Tyler Roback E, RN

## 2014-04-16 LAB — GLUCOSE, CAPILLARY
GLUCOSE-CAPILLARY: 121 mg/dL — AB (ref 70–99)
Glucose-Capillary: 256 mg/dL — ABNORMAL HIGH (ref 70–99)
Glucose-Capillary: 394 mg/dL — ABNORMAL HIGH (ref 70–99)
Glucose-Capillary: 137 mg/dL — ABNORMAL HIGH (ref 70–99)

## 2014-04-16 MED ORDER — INSULIN ASPART 100 UNIT/ML ~~LOC~~ SOLN
8.0000 [IU] | Freq: Three times a day (TID) | SUBCUTANEOUS | Status: DC
  Administered 2014-04-17 – 2014-04-18 (×3): 8 [IU] via SUBCUTANEOUS

## 2014-04-16 MED ORDER — INSULIN ASPART 100 UNIT/ML ~~LOC~~ SOLN
0.0000 [IU] | Freq: Three times a day (TID) | SUBCUTANEOUS | Status: DC
  Administered 2014-04-17: 2 [IU] via SUBCUTANEOUS

## 2014-04-16 MED ORDER — TRAZODONE HCL 100 MG PO TABS
100.0000 mg | ORAL_TABLET | Freq: Every day | ORAL | Status: DC
  Administered 2014-04-16 – 2014-04-17 (×2): 100 mg via ORAL
  Filled 2014-04-16 (×2): qty 1
  Filled 2014-04-16: qty 14
  Filled 2014-04-16: qty 1
  Filled 2014-04-16: qty 14
  Filled 2014-04-16: qty 1

## 2014-04-16 MED ORDER — INSULIN ASPART 100 UNIT/ML ~~LOC~~ SOLN
0.0000 [IU] | Freq: Every day | SUBCUTANEOUS | Status: DC
  Administered 2014-04-16 – 2014-04-17 (×2): 2 [IU] via SUBCUTANEOUS

## 2014-04-16 MED ORDER — HYDROXYZINE HCL 25 MG PO TABS
25.0000 mg | ORAL_TABLET | Freq: Once | ORAL | Status: AC
  Administered 2014-04-16: 25 mg via ORAL
  Filled 2014-04-16: qty 1

## 2014-04-16 NOTE — Progress Notes (Signed)
Patient ID: Tyler Burgess, male   DOB: 30-Aug-1955, 58 y.o.   MRN: [id number] D: Client visible on the unit, reports depression at "4" of 10. Client noted goals as attending all meetings and talking more. A: Writer reviewed medications, administered as ordered. Commended client for lower CBG. Staff will monitor q8mn for safety. R: Client is safe on the unit, attended group. Client reports "I didn't eat not snacks"  Goal met per client.

## 2014-04-16 NOTE — Plan of Care (Signed)
Problem: Diagnosis: Increased Risk For Suicide Attempt Goal: STG-Patient Will Attend All Groups On The Unit Outcome: Progressing Patient attended group this evening     
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
## 2014-04-16 NOTE — [facility] Group Notes (Signed)
[facility] LCSW Group Therapy  04/16/2014 1:36 PM  Type of Therapy:  Group Therapy  Participation Level:  Minimal  Participation Quality:  Drowsy  Affect:  Depressed and Flat  Cognitive:  Lacking  Insight:  Lacking  Engagement in Therapy:  Lacking  Modes of Intervention:  Confrontation, Discussion, Education, Exploration, Problem-solving, Rapport Building, Socialization and Support  Summary of Progress/Problems: Today's Topic: Overcoming Obstacles. Pt identified obstacles faced currently and processed barriers involved in overcoming these obstacles. Pt identified steps necessary for overcoming these obstacles and explored motivation (internal and external) for facing these difficulties head on. Pt further identified one area of concern in their lives and chose a skill of focus pulled from their "toolbox." Tyler Burgess was lethargic during group but was willing to participate in group discussion when called upon by CSW. Pt shared that "I am not good at overcoming obstacles apparently." "alcohol numbs me." Tyler Burgess shared that he experiences several losses within a short period of time and lost his faith in God due to this. Tyler Burgess stated that he has a hard time coping with these losses and turns to alcohol to numb his pain. Tyler Burgess acknowledged that he has to find alternative coping skills or he will eventually die from his substance abuse.   Smart, Tyler Burgess LCSWA 04/16/2014, 1:36 PM

## 2014-04-16 NOTE — Progress Notes (Signed)
Inpatient Diabetes Program Recommendations  AACE/ADA: New Consensus Statement on Inpatient Glycemic Control (2013)  Target Ranges:  Prepandial:   less than 140 mg/dL      Peak postprandial:   less than 180 mg/dL (1-2 hours)      Critically ill patients:  140 - 180 mg/dL   Results for Tyler Burgess, Tyler Burgess (MRN [id number]) as of 04/16/2014 15:34  Ref. Range 04/15/2014 11:50 04/15/2014 16:31 04/15/2014 20:56 04/16/2014 05:53 04/16/2014 11:24  Glucose-Capillary Latest Range: 70-99 mg/dL 829394 (H) 562200 (H) 130362 (H) 121 (H) 256 (H)    FBS looks good. Post-prandial blood sugars high. May benefit from increase in Novolog meal coverage insulin.  Recommendations: Increase Novolog to 8 units tidwc for meal coverage insulin.  Add HS correction.  Will continue to follow. Thank you. Ailene Ardshonda Teriana Danker, RD, LDN, CDE Inpatient Diabetes Coordinator [phone]

## 2014-04-16 NOTE — Progress Notes (Signed)
[facility] consulted for DM management. Pt is currently on Lantus 20 U QHS. Also continuing SSI, moderate coverage with night time coverage and meal coverage 8 U TID. Upon discharge pt is to continue the regimen with lantus and sliding scale. If compliance is the issue, maybe we can do sliding scale insulin BID at least. Pt also needs close outpatient follow up with PCP.   If pt has no PCP, you can [facility] [phone]) to have the appointment set up with one of the providers.   Will sign off for now and please call us with any other questions.   Debbora PrestoMAGICK-Victoria Euceda, MD  [facility] Pager [phone] Cell [phone]  If 7PM-7AM, please contact night-coverage [email or web address] Password [email or web address]

## 2014-04-16 NOTE — Progress Notes (Signed)
The focus of this group is to help patients review their daily goal of treatment and discuss progress on daily workbooks. Pt attended the evening group session and responded to all discussion prompts from the Writer. Pt shared that today was a good day on the unit, that he was looking forward to discharging tomorrow and hoped he could manage to be home with his family for Christmas. Pt stated he was feeling much better today than in previous days, a change he attributes to his medication working for him. Pt's only additional request from Nursing Staff this evening was to be allowed to shave, which was taken care of following group. Pt's affect was appropriate.

## 2014-04-16 NOTE — [facility] Group Notes (Signed)
[facility]   04/16/2014 11:19 AM  Participation Quality:  Engaged  Mood/Affect:  Appropriate  Depression Rating:  "a little"  Anxiety Rating:  "some"  Thoughts of Suicide:  No Will you contract for safety?   NA  Current AVH:  No  Plan for Discharge/Comments:  [name] states he no longer hears the "popping noise" and the roaring sound is starting to quiet.  Has been in relular conversation with aunt who is good support.  She is trying to get out of impound today.  "I hope to be home for Christmas.  I imagine the sheriff will be here to pick me up and take me to the judge when I am discharged.  I just hope they set bail and let me go home for the holidays."  Transportation Means: sheriff  Supports: aunt  [place], Baldo DaubRodney B

## 2014-04-17 LAB — GLUCOSE, CAPILLARY
GLUCOSE-CAPILLARY: 286 mg/dL — AB (ref 70–99)
GLUCOSE-CAPILLARY: 109 mg/dL — AB (ref 70–99)
Glucose-Capillary: 88 mg/dL (ref 70–99)
Glucose-Capillary: 147 mg/dL — ABNORMAL HIGH (ref 70–99)
Glucose-Capillary: 214 mg/dL — ABNORMAL HIGH (ref 70–99)

## 2014-04-17 LAB — LIPID PANEL
CHOL/HDL RATIO: 2.1 ratio
CHOLESTEROL: 120 mg/dL (ref 0–200)
HDL: 58 mg/dL (ref >39)
LDL Cholesterol: 50 mg/dL (ref 0–99)
Triglycerides: 61 mg/dL (ref <150)
VLDL: 12 mg/dL (ref 0–40)

## 2014-04-17 LAB — OVA AND PARASITE EXAMINATION

## 2014-04-17 NOTE — Plan of Care (Signed)
Problem: Alteration in mood Goal: LTG-Pt's behavior demonstrates decreased signs of depression (Patient's behavior demonstrates decreased signs of depression to the point the patient is safe to return home and continue treatment in an outpatient setting)  Outcome: Progressing   Client reports depression as "4" of 10. Reports he has attended all groups today and interacted well.

## 2014-04-17 NOTE — Progress Notes (Signed)
The focus of this group is to help patients review their daily goal of treatment and discuss progress on daily workbooks. Pt attended the evening group session and responded to all discussion prompts from the Writer. Pt shared that today was a good day on the unit, the highlight of which was learning he may be discharged tomorrow. [name] also said he was a little sad to leave the hospital as he had made several friends here who have supported him during his hospital stay. Pt was encouraged to keep this experience with him going forward and to remember the advice and suggestions he received while here. His only additional request from Nursing Staff this evening was to receive hospital gowns, which were given to him following group. Pt's affect was appropriate.

## 2014-04-17 NOTE — [facility] Group Notes (Signed)
0900 nursing orientation group   The focus of this group is to educate the patient on the purpose and policies of crisis stabilization and provide a format to answer questions about their admission.  The group details unit policies and expectations of patients while admitted.

## 2014-04-17 NOTE — Progress Notes (Addendum)
Patient ID: Tyler Burgess, male   DOB: 14-Mar-1956, 58 y.o.   MRN: [id number] [facility] MD Progress Note  04/17/2014 4:22 PM Sorren Vallier  MRN:  [id number] Subjective: Patient reports improvement and states he is hoping for discharge soon. He is tolerating medications well and denies medication side effects  Objective; Patient states he is feeling better. He stresses that recent violent behavior ( bringing gun to outpatient clinic) is " very out of character for me" , and denies any prior history of violence or threatening behaviors, and denies any current thoughts of hurting anybody. He states he is aware that he is " going to be arrested right after I leave here", and states " I guess I have to own up to what I did". States he plans " post bail if I can so I can spend Holidays with my family" On unit behavior is calm and in good control . Visible on unit. Participating in milieu. Denies medication side effects. .    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 Psychiatric: His behavior is normal.    Review of Systems  Constitutional: Negative.   HENT: Negative.   Eyes: Negative.   Respiratory: Negative.   Cardiovascular: Negative.   Gastrointestinal: Negative.   Genitourinary: Negative.   Musculoskeletal: Negative.   Skin: Negative.   Neurological: Negative.   Endo/Heme/Allergies: Negative.   Psychiatric/Behavioral: Positive for depression and substance abuse. Negative for suicidal ideas, hallucinations and memory loss. The patient  is not nervous/anxious and does not have insomnia.     Blood pressure 102/77, pulse 95, temperature 98.2 F (36.8 C), temperature source Oral, resp. rate 18, height 5' 9.75" (1.772 m), weight 72.576 kg (160 lb).Body mass index is 23.11 kg/(m^2).  General Appearance: Casual  Eye Contact::  Good  Speech:  Clear and Coherent  Volume:  Normal  Mood:  improved, somewhat anxious  Affect:  appropriate, reactive, not irritable  Thought Process:  Linear  Orientation:  Full (Time, Place, and Person)  Thought Content:  denies any hallucinations, and does not appear internally preoccupied, no delusions expressed  Suicidal Thoughts:  No At this time denies any plan or intention or thoughts of hurting self or anyone else   Homicidal Thoughts:  No  Memory:  Recent and Remote grossly intact  Judgement:  Fair  Insight:  Fair  Psychomotor Activity:  Normal  Concentration:  Fair  Recall:  Good  Fund of Knowledge:Good  Language: Good  Akathisia:  No  Handed:  Right  AIMS (if indicated):     Assets:  Desire for Improvement  Sleep:  Number of Hours: 0   Musculoskeletal: Strength & Muscle Tone: within normal limits Gait & Station: normal Patient leans: N/A  Current Medications: Current Facility-Administered Medications  Medication Dose Route Frequency Provider Last Rate Last Dose  . acetaminophen (TYLENOL) tablet 650 mg  650 mg Oral Q6H PRN Ursula Alert, MD      . alum & mag hydroxide-simeth (MAALOX/MYLANTA) 200-200-20 MG/5ML suspension 30 mL  30 mL Oral Q4H PRN Saramma Eappen, MD      . ARIPiprazole (ABILIFY) tablet 5 mg  5 mg  Oral BID Ursula Alert, MD   5 mg at 04/17/14 0759  . benztropine (COGENTIN) tablet 0.5 mg  0.5 mg Oral BID Ursula Alert, MD   0.5 mg at 04/17/14 0759  . gabapentin (NEURONTIN) capsule 100 mg  100 mg Oral TID Ursula Alert, MD   100 mg at 04/17/14 0759  . hydrOXYzine (ATARAX/VISTARIL) tablet 25 mg  25 mg Oral TID PRN Janett Labella, NP   25 mg at 04/17/14 1010  .  insulin aspart (novoLOG) injection 0-15 Units  0-15 Units Subcutaneous TID WC Theodis Blaze, MD   2 Units at 04/17/14 1210  . insulin aspart (novoLOG) injection 0-5 Units  0-5 Units Subcutaneous QHS Theodis Blaze, MD   2 Units at 04/16/14 2118  . insulin aspart (novoLOG) injection 8 Units  8 Units Subcutaneous TID WC Theodis Blaze, MD   8 Units at 04/17/14 1212  . insulin glargine (LANTUS) injection 20 Units  20 Units Subcutaneous QHS Orson Eva, MD   20 Units at 04/16/14 2119  . loperamide (IMODIUM) capsule 2 mg  2 mg Oral PRN Dara Hoyer, PA-C   2 mg at 04/15/14 8850  . magnesium hydroxide (MILK OF MAGNESIA) suspension 30 mL  30 mL Oral Daily PRN Ursula Alert, MD      . multivitamin with minerals tablet 1 tablet  1 tablet Oral Daily Ursula Alert, MD   1 tablet at 04/17/14 0759  . sertraline (ZOLOFT) tablet 50 mg  50 mg Oral Daily Saramma Eappen, MD   50 mg at 04/17/14 0800  . thiamine (VITAMIN B-1) tablet 100 mg  100 mg Oral Daily Saramma Eappen, MD   100 mg at 04/17/14 0800  . traZODone (DESYREL) tablet 100 mg  100 mg Oral QHS Jenne Campus, MD   100 mg at 04/16/14 2115    Lab Results:  Results for orders placed or performed during the hospital encounter of 04/11/14 (from the past 48 hour(s))  Glucose, capillary     Status: Abnormal   Collection Time: 04/15/14  4:31 PM  Result Value Ref Range   Glucose-Capillary 200 (H) 70 - 99 mg/dL  Basic metabolic panel     Status: Abnormal   Collection Time: 04/15/14  7:41 PM  Result Value Ref Range   Sodium 139 137 - 147 mEq/L   Potassium 4.5 3.7 - 5.3 mEq/L   Chloride 105 96 - 112 mEq/L   CO2 23 19 - 32 mEq/L   Glucose, Bld 370 (H) 70 - 99 mg/dL   BUN 12 6 - 23 mg/dL   Creatinine, Ser 1.48 (H) 0.50 - 1.35 mg/dL   Calcium 8.5 8.4 - 10.5 mg/dL   GFR calc non Af Amer 51 (L) >90 mL/min   GFR calc Af Amer 59 (L) >90 mL/min    Comment: (NOTE) The eGFR has been calculated using the CKD EPI equation. This calculation has not been validated  in all clinical situations. eGFR's persistently <90 mL/min signify possible Chronic Kidney Disease.    Anion gap 11 5 - 15    Comment: Performed at [facility]  Glucose, capillary     Status: Abnormal   Collection Time: 04/15/14  8:56 PM  Result Value Ref Range   Glucose-Capillary 362 (H) 70 - 99 mg/dL   Comment 1 Notify RN   Glucose, capillary     Status: Abnormal   Collection Time: 04/16/14  5:53 AM  Result Value Ref Range   Glucose-Capillary 121 (H)  70 - 99 mg/dL   Comment 1 Notify RN   Glucose, capillary     Status: Abnormal   Collection Time: 04/16/14 11:24 AM  Result Value Ref Range   Glucose-Capillary 256 (H) 70 - 99 mg/dL   Comment 1 Documented in Chart    Comment 2 Notify RN   Glucose, capillary     Status: Abnormal   Collection Time: 04/16/14  4:18 PM  Result Value Ref Range   Glucose-Capillary 394 (H) 70 - 99 mg/dL   Comment 1 Documented in Chart    Comment 2 Notify RN   Glucose, capillary     Status: Abnormal   Collection Time: 04/16/14  9:00 PM  Result Value Ref Range   Glucose-Capillary 137 (H) 70 - 99 mg/dL   Comment 1 Notify RN    Comment 2 Documented in Chart   Glucose, capillary     Status: None   Collection Time: 04/17/14  6:02 AM  Result Value Ref Range   Glucose-Capillary 88 70 - 99 mg/dL  Glucose, capillary     Status: Abnormal   Collection Time: 04/17/14 11:51 AM  Result Value Ref Range   Glucose-Capillary 147 (H) 70 - 99 mg/dL   Comment 1 Documented in Chart    Comment 2 Notify RN     Physical Findings: AIMS: Facial and Oral Movements Muscles of Facial Expression: None, normal Lips and Perioral Area: None, normal Jaw: None, normal Tongue: None, normal,Extremity Movements Upper (arms, wrists, hands, fingers): None, normal Lower (legs, knees, ankles, toes): None, normal, Trunk Movements Neck, shoulders, hips: None, normal, Overall Severity Severity of abnormal movements (highest score from questions above): None,  normal Incapacitation due to abnormal movements: None, normal Patient's awareness of abnormal movements (rate only patient's report): No Awareness,    CIWA:  CIWA-Ar Total: 0 COWS:      Assessment Patient continues to improve and is tolerating medications well. He stresses he has no prior history of violence or threatening behaviors. On unit has been calm, pleasant, and has persistently denies any HI .   Treatment Plan Summary: Daily contact with patient to assess and evaluate symptoms and progress in treatment Medication management  Plan: Continue inpatient treatment and support. Consider Discharge soon as he continues to improve. Zoloft  50 mg po daily. Abilify  5 mg po bid . Gabapentin 100 mg po tid  Trazodone   100  mg po qhs for sleep. Request Lipid Panel    Medical Decision Making Problem Points:  Established problem, stable/improving (1), Review of last therapy session (1) and Review of psycho-social stressors (1) Data Points:  Review or order clinical lab tests (1) Review of medication regiment & side effects (2) Review of new medications or change in dosage (2)  I certify that inpatient services furnished can reasonably be expected to improve the patient's condition.   COBOS, Kunkle  04/17/2014, 4:22 PM

## 2014-04-17 NOTE — Tx Team (Signed)
  Interdisciplinary Treatment Plan Update   Date Reviewed:  04/17/2014  Time Reviewed:  11:01 AM  Progress in Treatment:   Attending groups: Yes Participating in groups: Yes Taking medication as prescribed: Yes  Tolerating medication: Yes Family/Significant other contact made: Yes  Patient understands diagnosis: Yes  Discussing patient identified problems/goals with staff: Yes  See initial care plan Medical problems stabilized or resolved: Yes Denies suicidal/homicidal ideation: Yes  In tx team Patient has not harmed self or others: Yes  For review of initial/current patient goals, please see plan of care.  Estimated Length of Stay:  [facility] D/C tomorrow  Reason for Continuation of Hospitalization:   New Problems/Goals identified:  N/A  Discharge Plan or Barriers:   return home, follow up outpt  Additional Comments:  Attendees:  Signature: Ivin BootySarama Eappen, MD 04/17/2014 11:01 AM   Signature: Richelle Itood Krrish Freund, LCSW 04/17/2014 11:01 AM  Signature: Fransisca KaufmannLaura Davis, NP 04/17/2014 11:01 AM  Signature:  RN 04/17/2014 11:01 AM  Signature: Robbie LouisVivian Kent, RN 04/17/2014 11:01 AM  Signature:  04/17/2014 11:01 AM  Signature:   04/17/2014 11:01 AM  Signature:    Signature:    Signature:    Signature:    Signature:    Signature:      Scribe for Treatment Team:   Richelle Itood Osama Coleson, LCSW  04/17/2014 11:01 AM

## 2014-04-17 NOTE — [facility] Group Notes (Signed)
[facility] Group Notes:  (Counselor/Nursing/MHT/Case Management/Adjunct)  04/17/2014 1:15PM  Type of Therapy:  Group Therapy  Participation Level:  Active  Participation Quality:  Appropriate  Affect:  Flat  Cognitive:  Oriented  Insight:  Improving  Engagement in Group:  Limited  Engagement in Therapy:  Limited  Modes of Intervention:  Discussion, Exploration and Socialization  Summary of Progress/Problems: The topic for group was balance in life.  Pt participated in the discussion about when their life was in balance and out of balance and how this feels.  Pt discussed ways to get back in balance and short term goals they can work on to get where they want to be. "I've done wrong, and I'm going to see what consequences I need to pay.  I don't need the extra weight of worrying or running from the law."  Germaine did not offer anything spontaneously, but was attentive throughout.  His mood is good, and he is optimistic.   Daryel Geraldorth, Daschel Roughton B 04/17/2014 11:33 AM

## 2014-04-17 NOTE — Progress Notes (Signed)
Pt has been up and active in the milieu today.  He rated his depression 7 hopelessness 5 and his anxiety a 8 on his self-inventory.  He denies any S/H ideation or A/V/H. He claims he has no plan in place for his discharge although the police will pick him up and take him to jail from here. Pt aware and seems a bit more fidgety today and anxious.

## 2014-04-17 NOTE — [facility] Suicide Risk Assessment (Signed)
[facility] INPATIENT:  Family/Significant Other Suicide Prevention Education  Suicide Prevention Education:  Education Completed; Arlana Pouchellie Axelong, aunt, [phone]  has been identified by the patient as the family member/significant other with whom the patient will be residing, and identified as the person(s) who will aid the patient in the event of a mental health crisis (suicidal ideations/suicide attempt).  With written consent from the patient, the family member/significant other has been provided the following suicide prevention education, prior to the and/or following the discharge of the patient.  The suicide prevention education provided includes the following:  Suicide risk factors  Suicide prevention and interventions  National Suicide Hotline telephone number  [facility] assessment telephone number  [facility] 911  [facility] and/or [facility] telephone number  Request made of family/significant other to:  Remove weapons (e.g., guns, rifles, knives), all items previously/currently identified as safety concern.    Remove drugs/medications (over-the-counter, prescriptions, illicit drugs), all items previously/currently identified as a safety concern.  The family member/significant other verbalizes understanding of the suicide prevention education information provided.  The family member/significant other agrees to remove the items of safety concern listed above.  Daryel Geraldorth, Kemyah Buser B 04/17/2014, 3:15 PM

## 2014-04-18 DIAGNOSIS — F411 Generalized anxiety disorder: Secondary | ICD-10-CM

## 2014-04-18 DIAGNOSIS — F1099 Alcohol use, unspecified with unspecified alcohol-induced disorder: Secondary | ICD-10-CM

## 2014-04-18 LAB — GLUCOSE, CAPILLARY
GLUCOSE-CAPILLARY: 64 mg/dL — AB (ref 70–99)
Glucose-Capillary: 266 mg/dL — ABNORMAL HIGH (ref 70–99)

## 2014-04-18 MED ORDER — BENZTROPINE MESYLATE 0.5 MG PO TABS: 0.5000 mg | ORAL_TABLET | Freq: Two times a day (BID) | ORAL | Status: DC

## 2014-04-18 MED ORDER — HYDROXYZINE HCL 50 MG PO TABS: 50.0000 mg | ORAL_TABLET | Freq: Three times a day (TID) | ORAL | Status: DC

## 2014-04-18 MED ORDER — INSULIN ASPART 100 UNIT/ML ~~LOC~~ SOLN: 18.0000 [IU] | Freq: Two times a day (BID) | SUBCUTANEOUS | Status: DC

## 2014-04-18 MED ORDER — TRAZODONE HCL 100 MG PO TABS
100.0000 mg | ORAL_TABLET | Freq: Every day | ORAL | Status: DC
Start: 2014-04-18 — End: 2014-06-19

## 2014-04-18 MED ORDER — ADULT MULTIVITAMIN W/MINERALS CH: 1.0000 | ORAL_TABLET | Freq: Every day | ORAL | Status: AC

## 2014-04-18 MED ORDER — INSULIN GLARGINE 100 UNIT/ML ~~LOC~~ SOLN: 24.0000 [IU] | Freq: Every day | SUBCUTANEOUS | Status: DC

## 2014-04-18 MED ORDER — GABAPENTIN 100 MG PO CAPS: 100.0000 mg | ORAL_CAPSULE | Freq: Three times a day (TID) | ORAL | Status: DC

## 2014-04-18 MED ORDER — ARIPIPRAZOLE 5 MG PO TABS: 5.0000 mg | ORAL_TABLET | Freq: Two times a day (BID) | ORAL | Status: DC

## 2014-04-18 MED ORDER — SERTRALINE HCL 50 MG PO TABS: 50.0000 mg | ORAL_TABLET | Freq: Every day | ORAL | Status: DC

## 2014-04-18 MED ORDER — HYDROXYZINE HCL 50 MG PO TABS
50.0000 mg | ORAL_TABLET | Freq: Three times a day (TID) | ORAL | Status: DC
  Filled 2014-04-18 (×3): qty 42
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
## 2014-04-18 NOTE — Progress Notes (Signed)
Patient ID: Tyler LoganBilly Burgess, male   DOB: 10/03/1955, 58 y.o.   MRN: [id number] D: client visible on the unit, reports anxiety at "6" of 10, denies SHI, AVH. Client reports being here has been helpful, "man yeah the staff has been wonderful, kind of hate to leave" "the groups talking to people has helped and the medication" Client discuses the fact that he will be escorted to jail by police after discharge, hopes that he will be arraigned. "I never had any charges before" Client still reports anxiety is related to the fact he may have to go to jail. A: Writer provided emotional support encouraged client notify family and speak to court about any options. Staff will monitor q3315min for safety. R: Client is safe on the unit, attended group. Client up walking most of the night, even with night medications, checks the time each hour on the hour.

## 2014-04-18 NOTE — Progress Notes (Signed)
Patient ID: Tyler Burgess, male   DOB: 12/14/1955, 58 y.o.   MRN: [id number] Pt belongings returned and signed for. Written scripts given. AVS reviewed and given to pt. Pt verbalized feelings of safety and did not appear to be in acute distress. Discharged to lobby, where law enforcement awaited.

## 2014-04-18 NOTE — Plan of Care (Signed)
Problem: Ineffective individual coping Goal: STG:Pt. will utilize relaxation techniques to reduce stress STG: Patient will utilize relaxation techniques to reduce stress levels  Outcome: Progressing  Client walks to help decrease stress, also notes groups have been helpful. "just talking to everybody"

## 2014-04-18 NOTE — Plan of Care (Signed)
Problem: Alteration in mood Goal: LTG-Patient reports reduction in suicidal thoughts (Patient reports reduction in suicidal thoughts and is able to verbalize a safety plan for whenever patient is feeling suicidal)  Outcome: Progressing Client currently denies suicidal ideations, no gestures to harm self the last two nights writer has been with client. Client contracts for safety.  "I get to leave tomorrow"

## 2014-04-18 NOTE — [facility] Suicide Risk Assessment (Signed)
Suicide Risk Assessment  Discharge Assessment     Demographic Factors:  Male  Total Time spent with patient: 30 minutes  Psychiatric Specialty Exam:     Blood pressure 101/68, pulse 98, temperature 98.1 F (36.7 C), temperature source Oral, resp. rate 20, height 5' 9.75" (1.772 m), weight 72.576 kg (160 lb).Body mass index is 23.11 kg/(m^2).  General Appearance: Casual  Eye Contact::  Fair  Speech:  Slow  Volume:  Normal  Mood:  Dysphoric  Affect:  Congruent  Thought Process:  Coherent  Orientation:  Full (Time, Place, and Person)  Thought Content:  Rumination  Suicidal Thoughts:  No  Homicidal Thoughts:  No  Memory:  Immediate;   Fair Recent;   Fair  Judgement:  Fair  Insight:  Fair  Psychomotor Activity:  Normal  Concentration:  Fair  Recall:  FiservFair  Fund of Knowledge:Fair  Language: Fair  Akathisia:  Negative  Handed:  Right  AIMS (if indicated):     Assets:  Communication Skills Desire for Improvement Social Support Vocational/Educational  Sleep:  Number of Hours: 1.75    Musculoskeletal: Strength & Muscle Tone: within normal limits Gait & Station: normal Patient leans: N/A   Mental Status Per Nursing Assessment::   On Admission:  Self-harm thoughts  Current Mental Status by Physician: see MSE above  Loss Factors: Loss of significant relationship and Financial problems/change in socioeconomic status  Historical Factors: Impulsivity  Risk Reduction Factors:   Sense of responsibility to family, Positive therapeutic relationship and Positive coping skills or problem solving skills  Continued Clinical Symptoms:  Dysthymia More than one psychiatric diagnosis Previous Psychiatric Diagnoses and Treatments  Cognitive Features That Contribute To Risk:  Closed-mindedness    Suicide Risk:  Minimal: No identifiable suicidal ideation.  Patients presenting with no risk factors but with morbid ruminations; may be classified as minimal risk based on the  severity of the depressive symptoms  Discharge Diagnoses:   AXIS I:  Generalized Anxiety Disorder, Major Depression, Recurrent severe and alcohol use disorder, moderate AXIS II:  Deferred AXIS III:   Past Medical History  Diagnosis Date  . Diabetes mellitus without complication   . Depression    AXIS IV:  economic problems, other psychosocial or environmental problems and problems related to social environment AXIS V:  51-60 moderate symptoms  Plan Of Care/Follow-up recommendations:  Activity:  as tolerated Diet:  Diabetic diet Follow up with [facility] and recommended treatment. Compliance with medications. Avoidance of alcohol and join support groups. See Discharge summary for detail. Is patient on multiple antipsychotic therapies at discharge:  No   Has Patient had three or more failed trials of antipsychotic monotherapy by history:  No  Recommended Plan for Multiple Antipsychotic Therapies: NA    Gatlin Kittell 04/18/2014, 10:39 AM

## 2014-04-18 NOTE — Progress Notes (Signed)
Patient ID: Tyler Burgess, male   DOB: 01/13/1956, 58 y.o.   MRN: [id number] D: Patient denies SI/HI and auditory and visual hallucinations. Patient states he is ready to be discharged and that being here has helped him.  States he is worried about charges brought against him but realizes that he can not change what he has done and wants to move on with his life.  A: Patient given emotional support from RN. Patient given medications per MD orders. Patient encouraged to attend groups and unit activities. Patient encouraged to come to staff with any questions or concerns.  R: Patient remains cooperative and appropriate. Will continue to monitor patient for safety.

## 2014-04-18 NOTE — Progress Notes (Signed)
[facility] Dis[facility] :  Will you be returning to the same living situation after discharge: Yes,  home with aunt At discharge, do you have transportation home?:Yes,  sheriff Do you have the ability to pay for your medications:Yes,  mental health  Release of information consent forms completed and in the chart;  Patient's signature needed at discharge.  Patient to Follow up at: Follow-up Information    [facility] On 04/23/2014.   Why:  Monday at 10:00  Referral # [id number]   Contact information:   [address]  [place]  [phone]      Patient denies SI/HI:   Yes,  yes    Safety Planning and Suicide Prevention discussed:  Yes,  yes    Ida Rogueorth, Tyler Burgess 04/18/2014, 8:10 AM

## 2014-04-23 NOTE — Progress Notes (Signed)
Patient Discharge Instructions:  After Visit Summary (AVS):   Faxed to:  04/23/14 Discharge Summary Note:   Faxed to:  04/23/14 Psychiatric Admission Assessment Note:   Faxed to:  04/23/14 Suicide Risk Assessment - Discharge Assessment:   Faxed to:  04/23/14 Faxed/Sent to the Next Level Care provider:  04/23/14 Next Level Care Provider Has Access to the EMR, 04/23/14  Fax[facility] to [facility] @ [phone] Records provided to [facility] via CH[facility]/Epic access.    Jerelene ReddenSheena E [place], 04/23/2014, 3:30 PM

## 2014-06-16 ENCOUNTER — Emergency Department (HOSPITAL_COMMUNITY)
Admission: EM | Admit: 2014-06-16 | Discharge: 2014-06-19 | Disposition: A | Discharge status: Home / Self Care | Payer: 59 | Attending: Emergency Medicine | Admitting: Emergency Medicine

## 2014-06-16 DIAGNOSIS — E119 Type 2 diabetes mellitus without complications: Secondary | ICD-10-CM | POA: Insufficient documentation

## 2014-06-16 DIAGNOSIS — Z720 Tobacco use: Secondary | ICD-10-CM | POA: Insufficient documentation

## 2014-06-16 DIAGNOSIS — R4589 Other symptoms and signs involving emotional state: Secondary | ICD-10-CM | POA: Diagnosis present

## 2014-06-16 DIAGNOSIS — Z794 Long term (current) use of insulin: Secondary | ICD-10-CM | POA: Insufficient documentation

## 2014-06-16 DIAGNOSIS — F319 Bipolar disorder, unspecified: Secondary | ICD-10-CM | POA: Diagnosis present

## 2014-06-16 DIAGNOSIS — R45851 Suicidal ideations: Secondary | ICD-10-CM | POA: Insufficient documentation

## 2014-06-16 DIAGNOSIS — Z79899 Other long term (current) drug therapy: Secondary | ICD-10-CM | POA: Insufficient documentation

## 2014-06-16 DIAGNOSIS — R4689 Other symptoms and signs involving appearance and behavior: Secondary | ICD-10-CM

## 2014-06-16 DIAGNOSIS — Z8659 Personal history of other mental and behavioral disorders: Secondary | ICD-10-CM | POA: Insufficient documentation

## 2014-06-16 LAB — CBC WITH DIFFERENTIAL/PLATELET
BASOS ABS: 0.1 10*3/uL (ref 0.0–0.1)
BASOS PCT: 1 % (ref 0–1)
Eosinophils Absolute: 0.1 10*3/uL (ref 0.0–0.7)
Eosinophils Relative: 1 % (ref 0–5)
HEMATOCRIT: 31.3 % — AB (ref 39.0–52.0)
Hemoglobin: 10.8 g/dL — ABNORMAL LOW (ref 13.0–17.0)
Lymphocytes Relative: 23 % (ref 12–46)
Lymphs Abs: 2.3 10*3/uL (ref 0.7–4.0)
MCH: 30.9 pg (ref 26.0–34.0)
MCHC: 34.5 g/dL (ref 30.0–36.0)
MCV: 89.4 fL (ref 78.0–100.0)
Monocytes Absolute: 0.6 10*3/uL (ref 0.1–1.0)
Monocytes Relative: 6 % (ref 3–12)
NEUTROS ABS: 7 10*3/uL (ref 1.7–7.7)
NEUTROS PCT: 69 % (ref 43–77)
Platelets: 231 10*3/uL (ref 150–400)
RBC: 3.5 MIL/uL — AB (ref 4.22–5.81)
RDW: 12.5 % (ref 11.5–15.5)
WBC: 9.9 10*3/uL (ref 4.0–10.5)

## 2014-06-16 LAB — COMPREHENSIVE METABOLIC PANEL
ALBUMIN: 3.5 g/dL (ref 3.5–5.2)
ALK PHOS: 111 U/L (ref 39–117)
ALT: 65 U/L — ABNORMAL HIGH (ref 0–53)
AST: 73 U/L — AB (ref 0–37)
Anion gap: 7 (ref 5–15)
BILIRUBIN TOTAL: 0.7 mg/dL (ref 0.3–1.2)
BUN: 15 mg/dL (ref 6–23)
CALCIUM: 8.4 mg/dL (ref 8.4–10.5)
CHLORIDE: 102 mmol/L (ref 96–112)
CO2: 22 mmol/L (ref 19–32)
CREATININE: 1.54 mg/dL — AB (ref 0.50–1.35)
GFR calc Af Amer: 57 mL/min — ABNORMAL LOW (ref >90)
GFR, EST NON AFRICAN AMERICAN: 49 mL/min — AB (ref >90)
GLUCOSE: 485 mg/dL — AB (ref 70–99)
POTASSIUM: 5.2 mmol/L — AB (ref 3.5–5.1)
SODIUM: 131 mmol/L — AB (ref 135–145)
TOTAL PROTEIN: 6.7 g/dL (ref 6.0–8.3)

## 2014-06-16 LAB — I-STAT CHEM 8, ED
BUN: 17 mg/dL (ref 6–23)
CALCIUM ION: 1.26 mmol/L — AB (ref 1.12–1.23)
Chloride: 104 mmol/L (ref 96–112)
Creatinine, Ser: 1.2 mg/dL (ref 0.50–1.35)
Glucose, Bld: 223 mg/dL — ABNORMAL HIGH (ref 70–99)
HCT: 33 % — ABNORMAL LOW (ref 39.0–52.0)
HEMOGLOBIN: 11.2 g/dL — AB (ref 13.0–17.0)
POTASSIUM: 4.5 mmol/L (ref 3.5–5.1)
Sodium: 137 mmol/L (ref 135–145)
TCO2: 19 mmol/L (ref 0–100)

## 2014-06-16 LAB — RAPID URINE DRUG SCREEN, HOSP PERFORMED
Amphetamines: NOT DETECTED
BARBITURATES: NOT DETECTED
BENZODIAZEPINES: NOT DETECTED
Cocaine: NOT DETECTED
OPIATES: NOT DETECTED
TETRAHYDROCANNABINOL: NOT DETECTED

## 2014-06-16 LAB — CBG MONITORING, ED
GLUCOSE-CAPILLARY: 173 mg/dL — AB (ref 70–99)
GLUCOSE-CAPILLARY: 196 mg/dL — AB (ref 70–99)
Glucose-Capillary: 452 mg/dL — ABNORMAL HIGH (ref 70–99)
Glucose-Capillary: 288 mg/dL — ABNORMAL HIGH (ref 70–99)
Glucose-Capillary: 113 mg/dL — ABNORMAL HIGH (ref 70–99)

## 2014-06-16 LAB — ETHANOL

## 2014-06-16 MED ORDER — SERTRALINE HCL 50 MG PO TABS
50.0000 mg | ORAL_TABLET | Freq: Every day | ORAL | Status: DC
  Administered 2014-06-16 – 2014-06-19 (×4): 50 mg via ORAL
  Filled 2014-06-16 (×4): qty 1

## 2014-06-16 MED ORDER — HYDROXYZINE HCL 25 MG PO TABS
50.0000 mg | ORAL_TABLET | Freq: Three times a day (TID) | ORAL | Status: DC
  Administered 2014-06-16 – 2014-06-19 (×9): 50 mg via ORAL
  Filled 2014-06-16 (×9): qty 2

## 2014-06-16 MED ORDER — ONDANSETRON HCL 4 MG PO TABS: 4.0000 mg | ORAL_TABLET | Freq: Three times a day (TID) | ORAL | Status: DC | PRN

## 2014-06-16 MED ORDER — ARIPIPRAZOLE 10 MG PO TABS
5.0000 mg | ORAL_TABLET | Freq: Two times a day (BID) | ORAL | Status: DC
  Administered 2014-06-16 – 2014-06-19 (×6): 5 mg via ORAL
  Filled 2014-06-16 (×4): qty 1

## 2014-06-16 MED ORDER — LORAZEPAM 1 MG PO TABS
1.0000 mg | ORAL_TABLET | Freq: Three times a day (TID) | ORAL | Status: DC | PRN
  Administered 2014-06-18 (×2): 1 mg via ORAL
  Filled 2014-06-16 (×2): qty 1

## 2014-06-16 MED ORDER — BENZTROPINE MESYLATE 1 MG PO TABS
0.5000 mg | ORAL_TABLET | Freq: Two times a day (BID) | ORAL | Status: DC
  Administered 2014-06-16 – 2014-06-19 (×6): 0.5 mg via ORAL
  Filled 2014-06-16 (×6): qty 1

## 2014-06-16 MED ORDER — ZOLPIDEM TARTRATE 5 MG PO TABS: 5.0000 mg | ORAL_TABLET | Freq: Every evening | ORAL | Status: DC | PRN

## 2014-06-16 MED ORDER — INSULIN ASPART 100 UNIT/ML ~~LOC~~ SOLN
0.0000 [IU] | SUBCUTANEOUS | Status: DC
  Administered 2014-06-16: 4 [IU] via SUBCUTANEOUS
  Administered 2014-06-17: 16 [IU] via SUBCUTANEOUS
  Administered 2014-06-17: 2 [IU] via SUBCUTANEOUS
  Administered 2014-06-17: 12 [IU] via SUBCUTANEOUS
  Administered 2014-06-17 (×2): 4 [IU] via SUBCUTANEOUS
  Administered 2014-06-18: 8 [IU] via SUBCUTANEOUS
  Administered 2014-06-18: 12 [IU] via SUBCUTANEOUS
  Administered 2014-06-18: 20 [IU] via SUBCUTANEOUS
  Administered 2014-06-18: 2 [IU] via SUBCUTANEOUS
  Administered 2014-06-18: 8 [IU] via SUBCUTANEOUS
  Administered 2014-06-18 (×2): 4 [IU] via SUBCUTANEOUS
  Administered 2014-06-19: 12 [IU] via SUBCUTANEOUS
  Administered 2014-06-19: 8 [IU] via SUBCUTANEOUS
  Filled 2014-06-16 (×13): qty 1

## 2014-06-16 MED ORDER — TRAZODONE HCL 100 MG PO TABS
100.0000 mg | ORAL_TABLET | Freq: Every day | ORAL | Status: DC
Start: 2014-06-16 — End: 2014-06-19
  Administered 2014-06-16 – 2014-06-18 (×3): 100 mg via ORAL
  Filled 2014-06-16 (×3): qty 1

## 2014-06-16 MED ORDER — INSULIN ASPART 100 UNIT/ML ~~LOC~~ SOLN
18.0000 [IU] | Freq: Once | SUBCUTANEOUS | Status: AC
  Administered 2014-06-16: 18 [IU] via SUBCUTANEOUS
  Filled 2014-06-16: qty 1

## 2014-06-16 MED ORDER — GABAPENTIN 100 MG PO CAPS
100.0000 mg | ORAL_CAPSULE | Freq: Three times a day (TID) | ORAL | Status: DC
Start: 2014-06-16 — End: 2014-06-19
  Administered 2014-06-16 – 2014-06-19 (×9): 100 mg via ORAL
  Filled 2014-06-16 (×13): qty 1

## 2014-06-16 MED ORDER — SODIUM CHLORIDE 0.9 % IV BOLUS (SEPSIS)
1000.0000 mL | Freq: Once | INTRAVENOUS | Status: AC
  Administered 2014-06-16: 1000 mL via INTRAVENOUS

## 2014-06-16 NOTE — ED Provider Notes (Signed)
CSN: [id number]     Arrival date & time 06/16/14  0932 History   First MD Initiated Contact with Patient 06/16/14 [phone]     Chief Complaint  Patient presents with  . Suicidal     (Consider location/radiation/quality/duration/timing/severity/associated sxs/prior Treatment) HPI 50 y. Year old male presents today stating that he is suicidal. He has a history of bipolar affective disorder, depression, opiate dependence, and alcohol abuse who presents today saying that he has not been taking his medications since he was last released from jail. He states that he was hospitalized for depression and then followed up at a market. He states there he came to gun at the provider because he was not getting the medications he wanted. He states at that time he was then arrested. He says that when he was released after incarceration he did not have his medications for his bipolar affective disorder. He has not been taking them for one month. He has been living in a truck. He states that he attempted to overdose on trazodone 3 nights ago. He states this did not work. He states he still has lithium and trazodone. Past Medical History  Diagnosis Date  . Diabetes mellitus without complication   . Depression    Past Surgical History  Procedure Laterality Date  . Right arm    . Cholecystectomy     Family History  Problem Relation Age of Onset  . Diabetes Mother   . Depression Father   . Depression Brother   . Diabetes Other   . Depression Other    History  Substance Use Topics  . Smoking status: Current Every Day Smoker -- 0.50 packs/day    Types: Cigarettes  . Smokeless tobacco: Not on file  . Alcohol Use: Yes     Comment: 2 quarts of beer this am    Review of Systems  All other systems reviewed and are negative.     Allergies  Review of patient's allergies indicates no known allergies.  Home Medications   Prior to Admission medications   Medication Sig Start Date End Date Taking?  Authorizing Provider  ARIPiprazole (ABILIFY) 5 MG tablet Take 1 tablet (5 mg total) by mouth 2 (two) times daily. 04/18/14   Elmarie Shiley, NP  benztropine (COGENTIN) 0.5 MG tablet Take 1 tablet (0.5 mg total) by mouth 2 (two) times daily. 04/18/14   Elmarie Shiley, NP  Blood Glucose Monitoring Suppl (FIFTY50 GLUCOSE METER 2.0) W/DEVICE KIT Check 3 times a day before meals 01/18/14 01/18/15  Historical Provider, MD  gabapentin (NEURONTIN) 100 MG capsule Take 1 capsule (100 mg total) by mouth 3 (three) times daily. 04/18/14   Elmarie Shiley, NP  hydrOXYzine (ATARAX/VISTARIL) 50 MG tablet Take 1 tablet (50 mg total) by mouth 3 (three) times daily. 04/18/14   Elmarie Shiley, NP  insulin aspart (NOVOLOG) 100 UNIT/ML injection Inject 18 Units into the skin 2 (two) times daily. 04/18/14   Elmarie Shiley, NP  insulin glargine (LANTUS) 100 UNIT/ML injection Inject 0.24 mLs (24 Units total) into the skin at bedtime. 04/18/14   Elmarie Shiley, NP  Multiple Vitamin (MULTIVITAMIN WITH MINERALS) TABS tablet Take 1 tablet by mouth daily. 04/18/14   Elmarie Shiley, NP  sertraline (ZOLOFT) 50 MG tablet Take 1 tablet (50 mg total) by mouth daily. 04/18/14   Elmarie Shiley, NP  traZODone (DESYREL) 100 MG tablet Take 1 tablet (100 mg total) by mouth at bedtime. 04/18/14   Elmarie Shiley, NP   There were no vitals taken  for this visit. Physical Exam  Constitutional: He is oriented to person, place, and time. He appears well-developed and well-nourished.  HENT:  Head: Normocephalic and atraumatic.  Right Ear: External ear normal.  Left Ear: External ear normal.  Nose: Nose normal.  Mouth/Throat: Oropharynx is clear and moist.  Eyes: Conjunctivae and EOM are normal. Pupils are equal, round, and reactive to light.  Neck: Normal range of motion. Neck supple.  Cardiovascular: Normal rate, regular rhythm, normal heart sounds and intact distal pulses.   Pulmonary/Chest: Effort normal and breath sounds normal. No respiratory distress. He has no  wheezes. He exhibits no tenderness.  Abdominal: Soft. Bowel sounds are normal. He exhibits no distension and no mass. There is no tenderness. There is no guarding.  Musculoskeletal: Normal range of motion.  Neurological: He is alert and oriented to person, place, and time. He has normal reflexes. He exhibits normal muscle tone. Coordination normal.  Skin: Skin is warm and dry.  Psychiatric: He has a normal mood and affect. His behavior is normal. Judgment and thought content normal.  Nursing note and vitals reviewed.   ED Course  Procedures (including critical care time) Labs Review Labs Reviewed  CBC WITH DIFFERENTIAL/PLATELET  COMPREHENSIVE METABOLIC PANEL  URINE RAPID DRUG SCREEN (HOSP PERFORMED)  ETHANOL  CBG MONITORING, ED    Imaging Review No results found.   EKG Interpretation None      MDM  Patient not taking his medicine and known iddm.  BS elevated here and received ns and usual dose of insulin with bs down to 288.  Will recheck istat and recheck potassium.  NO evidence of dka.  Plan tts after istat if potassium down.   1- iddm- insulin restarted as per home meds.  2- hyperkalemia- resolved with hydration and insulin 3- bpad/suicidal- behavioral health assessment pending.   Shaune Pollack, MD 06/16/14 [phone]

## 2014-06-16 NOTE — ED Notes (Signed)
Patient states that he is suicidal.  States that he has been off his medication for over a month. States that he tried to OD 3 days ago with trazodone. Reports last opiate use was 5 years ago.

## 2014-06-16 NOTE — ED Notes (Signed)
TTS machine at bedside, telepsych assessment in progress at this time.
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
## 2014-06-16 NOTE — BH Assessment (Signed)
1400:  Consult with Dr. Rosalia Hammersay about the Patient:  Dr. Rosalia Hammersay reports the Patient has a history of Bipolar and reports current suicidal ideations.  She reports the Patient stated he attempted to overdose on Trazodone and Lithium 3 days ago.  Dr. Rosalia Hammersay reports the Patient is homeless and lives in his truck was receiving [facility] but was discharged in January 2016 because of his behavior.   1405:  Attempted to schedule tele-assessment, dead battery in Nurse's phone.  1433:  Spoke with Nurse Gabe to schedule tele-assessment.  He reports to wait 20 minutes, current labs are being drawn.      1510 to 1527:  Completed tele-assessment.  1535:  Consulted with Alberteen SamFran Hobson, Extender/NP, [facility].  1710:  Patient current attend Doctor notified of disposition.

## 2014-06-16 NOTE — ED Notes (Signed)
Spoke with Dr. Rosalia Hammersay about ordering patient's home meds.

## 2014-06-16 NOTE — ED Notes (Signed)
Pharmacy to send pt's Neurontin.

## 2014-06-16 NOTE — Progress Notes (Signed)
CSW pursued inpatient placement for patient:  [facility]  At Capacity:  [facility]  [place] [facility] [place] [facility] [place]  Left VM: Molli Barrowsowan  Itzabella Sorrels, [place] Disposition Social Worker [phone]

## 2014-06-16 NOTE — ED Notes (Signed)
Blood glucose = 113

## 2014-06-16 NOTE — ED Notes (Signed)
Pt to be moved to room C20, report given to RocklandBecky, [place]RN

## 2014-06-16 NOTE — ED Notes (Signed)
Security to wand patient at this time.  

## 2014-06-17 LAB — CBG MONITORING, ED
GLUCOSE-CAPILLARY: 125 mg/dL — AB (ref 70–99)
GLUCOSE-CAPILLARY: 191 mg/dL — AB (ref 70–99)
GLUCOSE-CAPILLARY: 125 mg/dL — AB (ref 70–99)
Glucose-Capillary: 136 mg/dL — ABNORMAL HIGH (ref 70–99)
Glucose-Capillary: 264 mg/dL — ABNORMAL HIGH (ref 70–99)
Glucose-Capillary: 314 mg/dL — ABNORMAL HIGH (ref 70–99)

## 2014-06-17 NOTE — Progress Notes (Signed)
[facility]  [facility] [place]  At Capacity: [facility] [place] [place] [address] [facility]  [facility] [place] [facility]  [place]    CSW continued inpatient placement for patient.  CSW spoke with Julieanne Cottonina AC [facility] to [facility] for [facility].  AC reported would review patient for possible bed but did not have a bed at this time due to unit acuity.  [facility] will [facility]    Adelene AmasEdith Lory Galan, LCSW Disposition Social Worker [phone]

## 2014-06-17 NOTE — Progress Notes (Signed)
CSW met with this 59 y/o, widowed, Caucasian, male that presents in hospital garb with S/I, depressed affect, mood reported as "not good."  Patient states he slept list night the full night and longest in "a while."  Patient states he usually only sleeping 3 hours per night.  Patient states he did drink "a couple quarts of beer 3 days ago to sleep, but I just woke up feeling more depressed."  Other than that he denies any current substance use, hx of opiate abuse 4 years ago.  Patient states the recent losses of friends, his son, and wife and business have caused him to be living in his truck and feeling worthless.   Patient states that after his last hospitalization in December the medication regiment was helping and he felt good.  However, he has no outpatient provider and was incarcerated for lack of child support and has not been on his medications and is feeling depressed again.  Patient states he feels about the same, only drowsier, and he is happy he slept.  Patient still cannot contract for safety at this point, CSW agrees with TTS that patient will most likely need inpatient psych placement in order to stabilize.  Patient discussed possibly reloc[facility] to [facility] after discharge.  [facility] Razi Hickle Richardo Priest ED CSW [phone]

## 2014-06-18 DIAGNOSIS — F329 Major depressive disorder, single episode, unspecified: Secondary | ICD-10-CM

## 2014-06-18 DIAGNOSIS — R45851 Suicidal ideations: Secondary | ICD-10-CM | POA: Insufficient documentation

## 2014-06-18 DIAGNOSIS — F1910 Other psychoactive substance abuse, uncomplicated: Secondary | ICD-10-CM

## 2014-06-18 LAB — CBG MONITORING, ED
GLUCOSE-CAPILLARY: 399 mg/dL — AB (ref 70–99)
GLUCOSE-CAPILLARY: 232 mg/dL — AB (ref 70–99)
GLUCOSE-CAPILLARY: 189 mg/dL — AB (ref 70–99)
GLUCOSE-CAPILLARY: 288 mg/dL — AB (ref 70–99)
Glucose-Capillary: 169 mg/dL — ABNORMAL HIGH (ref 70–99)
Glucose-Capillary: 243 mg/dL — ABNORMAL HIGH (ref 70–99)

## 2014-06-18 NOTE — ED Notes (Signed)
Spoke with pharmacy again RE abilify and they stated they were sending it up.

## 2014-06-18 NOTE — Consult Note (Signed)
Telepsych Consultation   Reason for Consult:  Suicidal Ideation Referring Physician:  EDP Patient Identification: Tyler Burgess MRN:  [id number] Principal Diagnosis: Suicidal behavior Diagnosis:   Patient Active Problem List   Diagnosis Date Noted  . Substance abuse [F19.10]   . Suicidal ideation [R45.851]   . Type 2 diabetes mellitus with hyperglycemia [E11.65] 04/15/2014  . Suicidal behavior [F48.9] 04/11/2014  . 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 GAD (generalized anxiety disorder) [F41.1] 04/11/2014  . Alcohol use disorder, severe, dependence [F10.20] 04/11/2014  . Opioid use disorder, moderate, in sustained remission [F11.90] 04/11/2014  . Opioid dependence in remission [F11.21] 01/10/2014  . Severe major depression without psychotic features [F32.2] 01/09/2014  . 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 Diabetes [E11.9] 12/11/2013  . 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 male patient admitted with concerns that he was out of his medications and that he felt like his depression was out of control. He reports feeling suicidal after he was told he couldn't see a physician for 3 months. Pt reports SI with a plan to overdose on his Trazodone. He states that he was so desperate, that he took a BB gun pistol and threatened staff at various clinics. He reports intermittent HI when people will not help him. Denies AVH. 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   HPI: Tyler Burgess is an 59 y.o. male, who reports driving himself to [facility] after talking with a Education officer, museum the previous day about suicidal ideations. The Patient presented orientated x4, mood "depressed, agitated, and aggravated", flat and depressed affect, current SI with a plan to overdose on Trazodone in his truck, and auditory  hallucinations of a train. Patient denied HI and VH. Patient reports previous mental health diagnoses of Bipolar and MDD and being prescribed Abilify and Zoloft. He reports previously receiving mental health treatment [facility], however he was banned after pulling an air pistol on Staff. Patient reports being admitted to [facility] after that incidence and being placed on a benefical medication regimen. Patient reports being incarcerated for the incidence after discharge from [facility] and his medication stopped. He reports being placed on probation in [place]. Patient reports being incarcerated again for non-child support and not being on prescribed psychotropic medications for the past month. Patient reports being homeless and living in his truck for the past 4 to 5 months. He reports sleeping 3 to 4 hours per night and eating 1 meal per day with no appetite. Patient reports losing 10 unintentional pounds in the past month. Patient reports seeking outpatient mental health treatment at [facility], however first available medication management appointment is not until July 03, 2013. Patient reports all he wants is to be placed back on a beneficial medication regimen. Patient reports he can contract for safety if he is discharged with a beneficial medication regimen. Patient reports his Brother who lives in the local area is supportive. Patient reports he periodical stays in a house that he is painting when it is cold outside because it has heat.   HPI Elements:   Location:  Psychiatric. Quality:  Worsening. Severity:  Severe. Timing:  Constant. Duration:  Chronic. Context:  Exacerbation of underlying depression secondary to inability to receive followup in timely manner.  Past Medical History:  Past Medical History  Diagnosis Date  . Diabetes  mellitus without complication   . Depression     Past Surgical History  Procedure Laterality Date  . Right arm    .  Cholecystectomy     Family History:  Family History  Problem Relation Age of Onset  . Diabetes Mother   . Depression Father   . Depression Brother   . Diabetes Other   . Depression Other    Social History:  History  Alcohol Use  . Yes    Comment: 2 quarts of beer this am     History  Drug Use No    History   Social History  . Marital Status: Widowed    Spouse Name: N/A  . Number of Children: N/A  . Years of Education: N/A   Social History Main Topics  . Smoking status: Current Every Day Smoker -- 0.50 packs/day    Types: Cigarettes  . Smokeless tobacco: Not on file  . Alcohol Use: Yes     Comment: 2 quarts of beer this am  . Drug Use: No  . Sexual Activity: Not on file   Other Topics Concern  . Not on file   Social History Narrative   Additional Social History:                          Allergies:  No Known Allergies  Vitals: Blood pressure 109/72, pulse 57, temperature 97.9 F (36.6 C), temperature source Oral, resp. rate 17, SpO2 98 %.  Risk to Self: Suicidal Ideation: Yes-Currently Present Suicidal Intent: Yes-Currently Present Is patient at risk for suicide?: Yes Suicidal Plan?: Yes-Currently Present Specify Current Suicidal Plan: Over dose on trazodone that is in his truck parked in the ED parking lot Access to Means: No What has been your use of drugs/alcohol within the last 12 months?: Alcohol How many times?: 0 Other Self Harm Risks: None Triggers for Past Attempts: None known Intentional Self Injurious Behavior: None Risk to Others: Homicidal Ideation: No-Not Currently/Within Last 6 Months Thoughts of Harm to Others: No-Not Currently Present/Within Last 6 Months Current Homicidal Intent: No-Not Currently/Within Last 6 Months Current Homicidal Plan: No-Not Currently/Within Last 6 Months Access to Homicidal Means: No (Patient denied having access to a gun/probation violation) Identified Victim: None History of harm to others?: Yes  (brought air pistol to [employer]) Assessment of Violence: On admission (None) Violent Behavior Description: None Does patient have access to weapons?: No (Patient denied access to a gun) Criminal Charges Pending?: No (Currently on probation in [facility]) Does patient have a court date: No (Has Probation Office next office visit 06-21-2014) Prior Inpatient Therapy: Prior Inpatient Therapy: Yes Prior Therapy Dates: December 2015 Prior Therapy Facilty/Provider(s): [facility] Reason for Treatment: Homicidal ideations Prior Outpatient Therapy: Prior Outpatient Therapy: Yes Prior Therapy Dates: December 2015 Prior Therapy Facilty/Provider(s): [facility] Reason for Treatment: Bipolar, MDD  Current Facility-Administered Medications  Medication Dose Route Frequency Provider Last Rate Last Dose  . ARIPiprazole (ABILIFY) tablet 5 mg  5 mg Oral BID Shaune Pollack, MD   5 mg at 06/17/14 2150  . benztropine (COGENTIN) tablet 0.5 mg  0.5 mg Oral BID Shaune Pollack, MD   0.5 mg at 06/17/14 2127  . gabapentin (NEURONTIN) capsule 100 mg  100 mg Oral TID Shaune Pollack, MD   100 mg at 06/17/14 2126  . hydrOXYzine (ATARAX/VISTARIL) tablet 50 mg  50 mg Oral TID Shaune Pollack, MD   50 mg at 06/17/14 2126  .  insulin aspart (novoLOG) injection 0-24 Units  0-24 Units Subcutaneous 6 times per day Shaune Pollack, MD   8 Units at 06/18/14 0825  . LORazepam (ATIVAN) tablet 1 mg  1 mg Oral Q8H PRN Shaune Pollack, MD   1 mg at 06/18/14 0834  . ondansetron (ZOFRAN) tablet 4 mg  4 mg Oral Q8H PRN Shaune Pollack, MD      . sertraline (ZOLOFT) tablet 50 mg  50 mg Oral Daily Shaune Pollack, MD   50 mg at 06/17/14 1138  . traZODone (DESYREL) tablet 100 mg  100 mg Oral QHS Shaune Pollack, MD   100 mg at 06/17/14 2126  . zolpidem (AMBIEN) tablet 5 mg  5 mg Oral QHS PRN Shaune Pollack, MD       Current Outpatient Prescriptions  Medication Sig Dispense Refill  . Blood Glucose Monitoring Suppl (FIFTY50  GLUCOSE METER 2.0) W/DEVICE KIT Check 3 times a day before meals    . insulin aspart (NOVOLOG) 100 UNIT/ML injection Inject 18 Units into the skin 2 (two) times daily. 10 mL 11  . insulin glargine (LANTUS) 100 UNIT/ML injection Inject 0.24 mLs (24 Units total) into the skin at bedtime. 10 mL 11  . Multiple Vitamin (MULTIVITAMIN WITH MINERALS) TABS tablet Take 1 tablet by mouth daily.    . ARIPiprazole (ABILIFY) 5 MG tablet Take 1 tablet (5 mg total) by mouth 2 (two) times daily. 60 tablet 0  . benztropine (COGENTIN) 0.5 MG tablet Take 1 tablet (0.5 mg total) by mouth 2 (two) times daily. 60 tablet 0  . gabapentin (NEURONTIN) 100 MG capsule Take 1 capsule (100 mg total) by mouth 3 (three) times daily. 90 capsule 0  . hydrOXYzine (ATARAX/VISTARIL) 50 MG tablet Take 1 tablet (50 mg total) by mouth 3 (three) times daily. 30 tablet 0  . sertraline (ZOLOFT) 50 MG tablet Take 1 tablet (50 mg total) by mouth daily. 30 tablet 0  . traZODone (DESYREL) 100 MG tablet Take 1 tablet (100 mg total) by mouth at bedtime. 30 tablet 0    Musculoskeletal: Strength & Muscle Tone: UTO, camera Gait & Station: UTO, camera Patient leans: UTO, camera  Psychiatric Specialty Exam:     Blood pressure 109/72, pulse 57, temperature 97.9 F (36.6 C), temperature source Oral, resp. rate 17, SpO2 98 %.There is no weight on file to calculate BMI.  General Appearance: Casual and Fairly Groomed  Engineer, water::  Good  Speech:  Clear and Coherent and Normal Rate  Volume:  Normal  Mood:  Depressed  Affect:  Depressed  Thought Process:  Coherent and Goal Directed  Orientation:  Full (Time, Place, and Person)  Thought Content:  WDL  Suicidal Thoughts:  Yes.  with intent/plan  Homicidal Thoughts:  Yes.  without intent/plan  Memory:  Immediate;   Fair Recent;   Fair Remote;   Fair  Judgement:  Fair  Insight:  Fair  Psychomotor Activity:  Normal  Concentration:  Good  Recall:  Good  Fund of Knowledge:Good  Language:  Good  Akathisia:  No  Handed:    AIMS (if indicated):     Assets:  Communication Skills Desire for Improvement Physical Health  ADL's:  Intact  Cognition: WNL  Sleep:      Medical Decision Making: Review of Psycho-Social Stressors (1), Review or order clinical lab tests (1) and Established Problem, Worsening (2)   Treatment Plan Summary: See below  Plan:  Recommend psychiatric Inpatient admission when medically cleared.  Disposition:  -Admit to inpatient; refer to appropriate facilities   Benjamine Mola, FNP-BC 06/18/2014 09:03AM

## 2014-06-18 NOTE — Progress Notes (Addendum)
CSW ref[facility]ed pt [facility], [facility] Both  [place] @ capacity. [facility] - no answer  Tyler Burgess, LCSWA Disposition staff 06/18/2014 11:17 PM

## 2014-06-18 NOTE — ED Notes (Signed)
Night time snack given per pt. request

## 2014-06-18 NOTE — ED Notes (Signed)
Pt continues to feel anxious.  Given ativan.  Pt c/o feet and requested RN look at feet.  Open, red/black callous noted to bottom of R great toe and L lat foot.

## 2014-06-18 NOTE — ED Notes (Signed)
Pharmacy called to send abilify.

## 2014-06-18 NOTE — Progress Notes (Signed)
Pt referral faxed to [facility] as they reported bed availability.  Chad CordialLauren Carter, LCSWA 06/18/2014 3:06 PM

## 2014-06-18 NOTE — ED Notes (Signed)
CBG 232. 

## 2014-06-18 NOTE — ED Notes (Signed)
Pt bathing.

## 2014-06-18 NOTE — BH Assessment (Signed)
Contacted the following facilities for placement:  [facility]  AT CAPACITY: [facility], per [facility], per [facility], [facility], [facility], per [facility], per [facility], per [facility], per [name], per [facility], per [facility], per [facility], per Leticia [facility], per [facility], per [facility], per [facility]ham  NO RESPONSE: [facility]   [address]Demarious Kapur Ellis Patsy BaltimoreWarrick Jr, WisconsinLPC, [facility] Triage Specialist [phone]

## 2014-06-18 NOTE — ED Notes (Signed)
Spoke with pharmacist.  Still not abilify.  Will walk down to pharmacy.

## 2014-06-19 DIAGNOSIS — F3130 Bipolar disorder, current episode depressed, mild or moderate severity, unspecified: Secondary | ICD-10-CM | POA: Diagnosis not present

## 2014-06-19 DIAGNOSIS — R45851 Suicidal ideations: Secondary | ICD-10-CM | POA: Diagnosis not present

## 2014-06-19 LAB — CBG MONITORING, ED
GLUCOSE-CAPILLARY: 282 mg/dL — AB (ref 70–99)
Glucose-Capillary: 97 mg/dL (ref 70–99)
Glucose-Capillary: 237 mg/dL — ABNORMAL HIGH (ref 70–99)

## 2014-06-19 MED ORDER — SERTRALINE HCL 50 MG PO TABS: 50.0000 mg | ORAL_TABLET | Freq: Every day | ORAL | Status: DC

## 2014-06-19 MED ORDER — BENZTROPINE MESYLATE 0.5 MG PO TABS: 0.5000 mg | ORAL_TABLET | Freq: Two times a day (BID) | ORAL | Status: DC

## 2014-06-19 MED ORDER — HYDROXYZINE HCL 50 MG PO TABS: 50.0000 mg | ORAL_TABLET | Freq: Three times a day (TID) | ORAL | Status: DC

## 2014-06-19 MED ORDER — ARIPIPRAZOLE 5 MG PO TABS: 5.0000 mg | ORAL_TABLET | Freq: Two times a day (BID) | ORAL | Status: DC

## 2014-06-19 MED ORDER — TRAZODONE HCL 100 MG PO TABS: 100.0000 mg | ORAL_TABLET | Freq: Every day | ORAL | Status: DC

## 2014-06-19 MED ORDER — GABAPENTIN 100 MG PO CAPS
100.0000 mg | ORAL_CAPSULE | Freq: Three times a day (TID) | ORAL | Status: DC
Start: 2014-06-19 — End: 2014-08-31

## 2014-06-19 NOTE — ED Notes (Signed)
Pt has finished lunch. Denies si or hi.

## 2014-06-19 NOTE — Consult Note (Signed)
WOC wound consult note Pt reports neuropathy and insensate foot.  He reports ulcer to his right great toe present for several months.  He reports ill fitting shoe at times.  Reason for Consult: evaluation of ulcer right great toe Wound type: neuropathic foot ulcer Measurement:2.5cm x 2.0cm x 0.5cm  Wound bed: mostly clean after I aggressive swabbed area, some black, grey slough that I have removed with CSWD.   Drainage (amount, consistency, odor) minimal  Periwound:hyperkeratotic  Dressing procedure/placement/frequency: Clean with normal saline, cover with xeroform gauze, dry dressing, secure with tape. Change every other day.  Monitor for S/S of infection, explained this to patient as well.  No other wounds noted on right foot or left, he does have some callous on the left foot that do not require topical care. Recommend follow up in a wound care center or with podiatry for long term management of neuropathic foot ulcer once DC from psychiatric care.  Conservative Stopher wound debridement (CSWD performed at the bedside): cleansed right great toe with betadine swab, trimmed away non viable tissue.  Pt tolerated without problems.   Discussed POC with patient and bedside nurse.  Re consult if needed, will not follow at this time. Thanks  Larna Capelle Foot Lockerustin RN, CWOCN [phone])

## 2014-06-19 NOTE — ED Provider Notes (Addendum)
BP 110/68 mmHg  Pulse 68  Temp(Src) 98 F (36.7 C) (Oral)  Resp 18  SpO2 100%  Patient sitting up, eating breakfast.  NAD  Placement pending  Gerhard Munchobert Raj Landress, MD 06/19/14 1108  12:33 PM Per psych patient can go home.  Gerhard Munchobert Zaccheaus Storlie, MD 06/19/14 1233
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
## 2014-06-19 NOTE — ED Notes (Signed)
WOUND CARE NURSE AT BEDSIDE

## 2014-06-19 NOTE — Discharge Instructions (Signed)
Please be sure to follow-up as scheduled March 10 in [place].  Return here for concerning changes in her condition.

## 2014-06-19 NOTE — ED Notes (Signed)
DR JONNALAGADA HERE TO SEE PATIENT 

## 2014-07-24 ENCOUNTER — Encounter (HOSPITAL_COMMUNITY): Payer: Self-pay

## 2014-07-24 DIAGNOSIS — F329 Major depressive disorder, single episode, unspecified: Secondary | ICD-10-CM | POA: Insufficient documentation

## 2014-07-24 DIAGNOSIS — Z720 Tobacco use: Secondary | ICD-10-CM | POA: Insufficient documentation

## 2014-07-24 DIAGNOSIS — Z79899 Other long term (current) drug therapy: Secondary | ICD-10-CM | POA: Insufficient documentation

## 2014-07-24 DIAGNOSIS — E119 Type 2 diabetes mellitus without complications: Secondary | ICD-10-CM | POA: Insufficient documentation

## 2014-07-24 DIAGNOSIS — L97529 Non-pressure chronic ulcer of other part of left foot with unspecified severity: Secondary | ICD-10-CM | POA: Insufficient documentation

## 2014-07-24 DIAGNOSIS — L97519 Non-pressure chronic ulcer of other part of right foot with unspecified severity: Secondary | ICD-10-CM | POA: Insufficient documentation

## 2014-07-24 DIAGNOSIS — Z794 Long term (current) use of insulin: Secondary | ICD-10-CM | POA: Insufficient documentation

## 2014-07-24 NOTE — ED Notes (Signed)
Patient presents with ulcers and swelling to bilateral feet that started 3 months ago. Patient states redness and swelling extends to knees.

## 2014-07-25 ENCOUNTER — Emergency Department (HOSPITAL_COMMUNITY)
Admission: EM | Admit: 2014-07-25 | Discharge: 2014-07-25 | Disposition: A | Discharge status: Home / Self Care | Payer: Medicaid - Out of State | Attending: Emergency Medicine | Admitting: Emergency Medicine

## 2014-07-25 ENCOUNTER — Emergency Department (HOSPITAL_COMMUNITY): Payer: Medicaid - Out of State

## 2014-07-25 DIAGNOSIS — L97509 Non-pressure chronic ulcer of other part of unspecified foot with unspecified severity: Secondary | ICD-10-CM

## 2014-07-25 DIAGNOSIS — E1165 Type 2 diabetes mellitus with hyperglycemia: Secondary | ICD-10-CM

## 2014-07-25 LAB — CBG MONITORING, ED
Glucose-Capillary: 378 mg/dL — ABNORMAL HIGH (ref 70–99)
Glucose-Capillary: 307 mg/dL — ABNORMAL HIGH (ref 70–99)

## 2014-07-25 LAB — BASIC METABOLIC PANEL
Anion gap: 7 (ref 5–15)
BUN: 10 mg/dL (ref 6–23)
CO2: 22 mmol/L (ref 19–32)
CREATININE: 1.15 mg/dL (ref 0.50–1.35)
Calcium: 8.6 mg/dL (ref 8.4–10.5)
Chloride: 104 mmol/L (ref 96–112)
GFR calc Af Amer: 80 mL/min — ABNORMAL LOW (ref >90)
GFR, EST NON AFRICAN AMERICAN: 69 mL/min — AB (ref >90)
GLUCOSE: 454 mg/dL — AB (ref 70–99)
Potassium: 3.5 mmol/L (ref 3.5–5.1)
Sodium: 133 mmol/L — ABNORMAL LOW (ref 135–145)

## 2014-07-25 LAB — CBC WITH DIFFERENTIAL/PLATELET
Basophils Absolute: 0.1 10*3/uL (ref 0.0–0.1)
Basophils Relative: 1 % (ref 0–1)
Eosinophils Absolute: 0.1 10*3/uL (ref 0.0–0.7)
Eosinophils Relative: 1 % (ref 0–5)
HCT: 35.3 % — ABNORMAL LOW (ref 39.0–52.0)
Hemoglobin: 12 g/dL — ABNORMAL LOW (ref 13.0–17.0)
LYMPHS PCT: 28 % (ref 12–46)
Lymphs Abs: 2.8 10*3/uL (ref 0.7–4.0)
MCH: 32.6 pg (ref 26.0–34.0)
MCHC: 34 g/dL (ref 30.0–36.0)
MCV: 95.9 fL (ref 78.0–100.0)
MONOS PCT: 8 % (ref 3–12)
Monocytes Absolute: 0.8 10*3/uL (ref 0.1–1.0)
Neutro Abs: 6.2 10*3/uL (ref 1.7–7.7)
Neutrophils Relative %: 62 % (ref 43–77)
Platelets: 246 10*3/uL (ref 150–400)
RBC: 3.68 MIL/uL — ABNORMAL LOW (ref 4.22–5.81)
RDW: 14.5 % (ref 11.5–15.5)
WBC: 10 10*3/uL (ref 4.0–10.5)

## 2014-07-25 LAB — LACTIC ACID, PLASMA: LACTIC ACID, VENOUS: 1.3 mmol/L (ref 0.5–2.0)

## 2014-07-25 MED ORDER — AMOXICILLIN-POT CLAVULANATE 500-125 MG PO TABS: 1.0000 | ORAL_TABLET | Freq: Three times a day (TID) | ORAL | Status: DC

## 2014-07-25 MED ORDER — INSULIN GLARGINE 100 UNIT/ML ~~LOC~~ SOLN
20.0000 [IU] | Freq: Once | SUBCUTANEOUS | Status: AC
  Administered 2014-07-25: 20 [IU] via SUBCUTANEOUS

## 2014-07-25 MED ORDER — POVIDONE-IODINE 10 % EX SOLN
CUTANEOUS | Status: AC
  Filled 2014-07-25: qty 118

## 2014-07-25 MED ORDER — SODIUM CHLORIDE 0.9 % IV SOLN
Freq: Once | INTRAVENOUS | Status: AC
  Administered 2014-07-25: 1000 mL via INTRAVENOUS

## 2014-07-25 MED ORDER — SODIUM CHLORIDE 0.9 % IV SOLN
3.0000 g | Freq: Once | INTRAVENOUS | Status: AC
  Administered 2014-07-25: 3 g via INTRAVENOUS
  Filled 2014-07-25: qty 3

## 2014-07-25 MED ORDER — ONDANSETRON HCL 4 MG/2ML IJ SOLN
4.0000 mg | Freq: Once | INTRAMUSCULAR | Status: AC
  Administered 2014-07-25: 4 mg via INTRAVENOUS
  Filled 2014-07-25: qty 2

## 2014-07-25 MED ORDER — MORPHINE SULFATE 4 MG/ML IJ SOLN
4.0000 mg | Freq: Once | INTRAMUSCULAR | Status: AC
  Administered 2014-07-25: 4 mg via INTRAVENOUS
  Filled 2014-07-25: qty 1

## 2014-07-25 MED ORDER — HYDROCODONE-ACETAMINOPHEN 5-325 MG PO TABS: 1.0000 | ORAL_TABLET | Freq: Four times a day (QID) | ORAL | Status: DC | PRN

## 2014-07-25 NOTE — ED Notes (Signed)
Pt declines admission at this time and dr Judd Liendelo concurs. Pt agrees to return for any fever/shaking chills. Social work to f/u with pt RE: possible out patient wound care. Patient [facility] last year, currently has no PMD and limited financial resources.

## 2014-07-25 NOTE — ED Provider Notes (Signed)
CSN: [id number]     Arrival date & time 07/24/14  2241 History   This chart was scribed for Veryl Speak, MD by Randa Evens, ED Scribe. This patient was seen in room APA11/APA11 and the patient's care was started at 12:23 AM.    Chief Complaint  Patient presents with  . Leg Pain   Patient is a 59 y.o. male presenting with leg pain. The history is provided by the patient. No language interpreter was used.  Leg Pain Associated symptoms: no fever    HPI Comments: Tyler Burgess is a 59 y.o. male with PMHx of DM who presents to the Emergency Department complaining of bilateral foot pain onset 3 months prior. Pt presents with worsening blisters and swelling to the bilateral feet. Pt states that redness and swelling extends up to the knees. Pt states that the blisters are not healing. Pt states that he walks around in boots daily. Pt states that he has trouble with blood circulating to his feet. Pt denies fever or other related symptoms. Pt states that his sugars are in the 230 range.   Past Medical History  Diagnosis Date  . Diabetes mellitus without complication   . Depression    Past Surgical History  Procedure Laterality Date  . Right arm    . Cholecystectomy     Family History  Problem Relation Age of Onset  . Diabetes Mother   . Depression Father   . Depression Brother   . Diabetes Other   . Depression Other    History  Substance Use Topics  . Smoking status: Current Every Day Smoker -- 0.50 packs/day    Types: Cigarettes  . Smokeless tobacco: Not on file  . Alcohol Use: Yes     Comment: 2 quarts of beer this am    Review of Systems  Constitutional: Negative for fever.  Musculoskeletal: Positive for joint swelling and arthralgias.  Skin: Positive for color change and wound.  All other systems reviewed and are negative.    Allergies  Review of patient's allergies indicates no known allergies.  Home Medications   Prior to Admission medications   Medication Sig  Start Date End Date Taking? Authorizing Provider  ARIPiprazole (ABILIFY) 5 MG tablet Take 1 tablet (5 mg total) by mouth 2 (two) times daily. 06/19/14   Carmin Muskrat, MD  benztropine (COGENTIN) 0.5 MG tablet Take 1 tablet (0.5 mg total) by mouth 2 (two) times daily. 06/19/14   Carmin Muskrat, MD  Blood Glucose Monitoring Suppl (FIFTY50 GLUCOSE METER 2.0) W/DEVICE KIT Check 3 times a day before meals 01/18/14 01/18/15  Historical Provider, MD  gabapentin (NEURONTIN) 100 MG capsule Take 1 capsule (100 mg total) by mouth 3 (three) times daily. 06/19/14   Carmin Muskrat, MD  hydrOXYzine (ATARAX/VISTARIL) 50 MG tablet Take 1 tablet (50 mg total) by mouth 3 (three) times daily. 06/19/14   Carmin Muskrat, MD  insulin aspart (NOVOLOG) 100 UNIT/ML injection Inject 18 Units into the skin 2 (two) times daily. 04/18/14   Niel Hummer, NP  insulin glargine (LANTUS) 100 UNIT/ML injection Inject 0.24 mLs (24 Units total) into the skin at bedtime. 04/18/14   Niel Hummer, NP  Multiple Vitamin (MULTIVITAMIN WITH MINERALS) TABS tablet Take 1 tablet by mouth daily. 04/18/14   Niel Hummer, NP  sertraline (ZOLOFT) 50 MG tablet Take 1 tablet (50 mg total) by mouth daily. 06/19/14   Carmin Muskrat, MD  traZODone (DESYREL) 100 MG tablet Take 1 tablet (100 mg total)  by mouth at bedtime. 06/19/14   Carmin Muskrat, MD   BP 160/102 mmHg  Pulse 74  Temp(Src) 98.4 F (36.9 C) (Oral)  Resp 20  Ht 5' 10.5" (1.791 m)  Wt 165 lb (74.844 kg)  BMI 23.33 kg/m2  SpO2 100%   Physical Exam  Constitutional: He is oriented to person, place, and time. He appears well-developed and well-nourished. No distress.  HENT:  Head: Normocephalic and atraumatic.  Eyes: Conjunctivae and EOM are normal.  Neck: Neck supple. No tracheal deviation present.  Cardiovascular: Normal rate.   Pulmonary/Chest: Effort normal. No respiratory distress.  Musculoskeletal: Normal range of motion.  The right foot has a large decubitus ulcer to the  medial aspect of the great toe. There are also various other sores on other toes to a lesser degree.   The left foot has several decubitus ulcers as well but to a lesser degree.   Neurological: He is alert and oriented to person, place, and time.  Skin: Skin is warm and dry.  Psychiatric: He has a normal mood and affect. His behavior is normal.  Nursing note and vitals reviewed.   ED Course  Procedures (including critical care time) DIAGNOSTIC STUDIES: Oxygen Saturation is 100% on RA, normal by my interpretation.    COORDINATION OF CARE: 12:30 AM-Discussed treatment plan with pt at bedside and pt agreed to plan.     Labs Review Labs Reviewed - No data to display  Imaging Review No results found.   EKG Interpretation None      MDM   Final diagnoses:  None    Patient is a 59 year old male with history of diabetes for which she is on insulin. He presents with a several month history of sores and swelling to both feet. This is becoming worse and he is now having redness and swelling extending further up his legs. He denies any fevers or chills. He denies any injury or trauma.  His workup reveals an elevated sugar, but no evidence for DKA. X-rays were obtained of the bilateral feet, revealing no evidence for osteomyelitis. He does have significant soft tissue ulcers, especially to the right great toe. These wounds were cleaned and dressed, he was treated with IV Unasyn, and will be discharged with oral antibiotics. He was also given his home insulin and his sugars have significantly improved.  As the patient appears somewhat noncompliant and has no primary doctor, the possibility of admission was entertained, however the patient refused. He prefers to be discharged and I will do so at his request. He will be given the resource guide which can hopefully assist him in finding a primary care physician. He does understand to return if his symptoms significantly worsen or change. He  will be given pain medication and Augmentin to take for his feet.   I personally performed the services described in this documentation, which was scribed in my presence. The recorded information has been reviewed and is accurate.      Veryl Speak, MD 07/25/14 [phone]

## 2014-07-25 NOTE — Discharge Instructions (Signed)
Augmentin as prescribed.  Hydrocodone as prescribed as needed for pain.  You should obtain a primary care doctor with whom you can follow-up to discuss your diabetes and other issues.   Diabetes and Foot Care Diabetes may cause you to have problems because of poor blood supply (circulation) to your feet and legs. This may cause the skin on your feet to become thinner, break easier, and heal more slowly. Your skin may become dry, and the skin may peel and crack. You may also have nerve damage in your legs and feet causing decreased feeling in them. You may not notice minor injuries to your feet that could lead to infections or more serious problems. Taking care of your feet is one of the most important things you can do for yourself.  HOME CARE INSTRUCTIONS  Wear shoes at all times, even in the house. Do not go barefoot. Bare feet are easily injured.  Check your feet daily for blisters, cuts, and redness. If you cannot see the bottom of your feet, use a mirror or ask someone for help.  Wash your feet with warm water (do not use hot water) and mild soap. Then pat your feet and the areas between your toes until they are completely dry. Do not soak your feet as this can dry your skin.  Apply a moisturizing lotion or petroleum jelly (that does not contain alcohol and is unscented) to the skin on your feet and to dry, brittle toenails. Do not apply lotion between your toes.  Trim your toenails straight across. Do not dig under them or around the cuticle. File the edges of your nails with an emery board or nail file.  Do not cut corns or calluses or try to remove them with medicine.  Wear clean socks or stockings every day. Make sure they are not too tight. Do not wear knee-high stockings since they may decrease blood flow to your legs.  Wear shoes that fit properly and have enough cushioning. To break in new shoes, wear them for just a few hours a day. This prevents you from injuring your feet.  Always look in your shoes before you put them on to be sure there are no objects inside.  Do not cross your legs. This may decrease the blood flow to your feet.  If you find a minor scrape, cut, or break in the skin on your feet, keep it and the skin around it clean and dry. These areas may be cleansed with mild soap and water. Do not cleanse the area with peroxide, alcohol, or iodine.  When you remove an adhesive bandage, be sure not to damage the skin around it.  If you have a wound, look at it several times a day to make sure it is healing.  Do not use heating pads or hot water bottles. They may burn your skin. If you have lost feeling in your feet or legs, you may not know it is happening until it is too late.  Make sure your health care provider performs a complete foot exam at least annually or more often if you have foot problems. Report any cuts, sores, or bruises to your health care provider immediately. SEEK MEDICAL CARE IF:   You have an injury that is not healing.  You have cuts or breaks in the skin.  You have an ingrown nail.  You notice redness on your legs or feet.  You feel burning or tingling in your legs or feet.  You  have pain or cramps in your legs and feet.  Your legs or feet are numb.  Your feet always feel cold. SEEK IMMEDIATE MEDICAL CARE IF:   There is increasing redness, swelling, or pain in or around a wound.  There is a red line that goes up your leg.  Pus is coming from a wound.  You develop a fever or as directed by your health care provider.  You notice a bad smell coming from an ulcer or wound. Document Released: 04/10/2000 Document Revised: 12/14/2012 Document Reviewed: 09/20/2012 [facility] Patient Information 2015 [place], [place]. This information is not intended to replace advice given to you by your health care provider. Make sure you discuss any questions you have with your health care provider.

## 2014-07-25 NOTE — ED Notes (Signed)
Arrives barefoot in sandals. Both feet dirty. Multiple blisters and open areas on feet visible. Right lower leg and foot swollen and red from knee down. Left leg also red but swellling not as bad. Pulses non palp but easily heard with doppler on top of foot and at heel. Hands also red and swollen.

## 2014-08-31 ENCOUNTER — Emergency Department (HOSPITAL_COMMUNITY): Payer: Medicaid Other

## 2014-08-31 ENCOUNTER — Inpatient Hospital Stay (HOSPITAL_COMMUNITY)
Admission: EM | Admit: 2014-08-31 | Discharge: 2014-09-05 | DRG: 854 | Disposition: A | Discharge status: Home / Self Care | Payer: Medicaid Other | Attending: Internal Medicine | Admitting: Internal Medicine

## 2014-08-31 ENCOUNTER — Encounter (HOSPITAL_COMMUNITY): Payer: Self-pay | Admitting: Emergency Medicine

## 2014-08-31 DIAGNOSIS — E1140 Type 2 diabetes mellitus with diabetic neuropathy, unspecified: Secondary | ICD-10-CM | POA: Diagnosis not present

## 2014-08-31 DIAGNOSIS — E1169 Type 2 diabetes mellitus with other specified complication: Secondary | ICD-10-CM | POA: Diagnosis present

## 2014-08-31 DIAGNOSIS — A419 Sepsis, unspecified organism: Principal | ICD-10-CM

## 2014-08-31 DIAGNOSIS — E860 Dehydration: Secondary | ICD-10-CM | POA: Diagnosis present

## 2014-08-31 DIAGNOSIS — I10 Essential (primary) hypertension: Secondary | ICD-10-CM | POA: Diagnosis present

## 2014-08-31 DIAGNOSIS — E11649 Type 2 diabetes mellitus with hypoglycemia without coma: Secondary | ICD-10-CM | POA: Diagnosis present

## 2014-08-31 DIAGNOSIS — E871 Hypo-osmolality and hyponatremia: Secondary | ICD-10-CM | POA: Diagnosis present

## 2014-08-31 DIAGNOSIS — Z833 Family history of diabetes mellitus: Secondary | ICD-10-CM

## 2014-08-31 DIAGNOSIS — E1152 Type 2 diabetes mellitus with diabetic peripheral angiopathy with gangrene: Secondary | ICD-10-CM | POA: Diagnosis present

## 2014-08-31 DIAGNOSIS — E1165 Type 2 diabetes mellitus with hyperglycemia: Secondary | ICD-10-CM | POA: Diagnosis present

## 2014-08-31 DIAGNOSIS — E11621 Type 2 diabetes mellitus with foot ulcer: Secondary | ICD-10-CM | POA: Diagnosis present

## 2014-08-31 DIAGNOSIS — E1142 Type 2 diabetes mellitus with diabetic polyneuropathy: Secondary | ICD-10-CM | POA: Diagnosis present

## 2014-08-31 DIAGNOSIS — F1121 Opioid dependence, in remission: Secondary | ICD-10-CM | POA: Diagnosis present

## 2014-08-31 DIAGNOSIS — M869 Osteomyelitis, unspecified: Secondary | ICD-10-CM | POA: Diagnosis not present

## 2014-08-31 DIAGNOSIS — F419 Anxiety disorder, unspecified: Secondary | ICD-10-CM | POA: Diagnosis present

## 2014-08-31 DIAGNOSIS — M79671 Pain in right foot: Secondary | ICD-10-CM | POA: Diagnosis present

## 2014-08-31 DIAGNOSIS — A047 Enterocolitis due to Clostridium difficile: Secondary | ICD-10-CM | POA: Diagnosis present

## 2014-08-31 DIAGNOSIS — D649 Anemia, unspecified: Secondary | ICD-10-CM | POA: Diagnosis present

## 2014-08-31 DIAGNOSIS — L03115 Cellulitis of right lower limb: Secondary | ICD-10-CM | POA: Diagnosis present

## 2014-08-31 DIAGNOSIS — F319 Bipolar disorder, unspecified: Secondary | ICD-10-CM | POA: Diagnosis present

## 2014-08-31 DIAGNOSIS — B999 Unspecified infectious disease: Secondary | ICD-10-CM

## 2014-08-31 DIAGNOSIS — I96 Gangrene, not elsewhere classified: Secondary | ICD-10-CM

## 2014-08-31 DIAGNOSIS — E876 Hypokalemia: Secondary | ICD-10-CM | POA: Diagnosis present

## 2014-08-31 DIAGNOSIS — F3189 Other bipolar disorder: Secondary | ICD-10-CM | POA: Diagnosis present

## 2014-08-31 DIAGNOSIS — Z590 Homelessness: Secondary | ICD-10-CM

## 2014-08-31 DIAGNOSIS — M86171 Other acute osteomyelitis, right ankle and foot: Secondary | ICD-10-CM | POA: Diagnosis present

## 2014-08-31 DIAGNOSIS — F17210 Nicotine dependence, cigarettes, uncomplicated: Secondary | ICD-10-CM | POA: Diagnosis present

## 2014-08-31 DIAGNOSIS — Z794 Long term (current) use of insulin: Secondary | ICD-10-CM | POA: Diagnosis not present

## 2014-08-31 DIAGNOSIS — A0472 Enterocolitis due to Clostridium difficile, not specified as recurrent: Secondary | ICD-10-CM

## 2014-08-31 LAB — CBG MONITORING, ED: GLUCOSE-CAPILLARY: 339 mg/dL — AB (ref 70–99)

## 2014-08-31 LAB — CBC WITH DIFFERENTIAL/PLATELET
Basophils Absolute: 0.1 10*3/uL (ref 0.0–0.1)
Basophils Relative: 0 % (ref 0–1)
EOS ABS: 0 10*3/uL (ref 0.0–0.7)
EOS PCT: 0 % (ref 0–5)
HCT: 35.3 % — ABNORMAL LOW (ref 39.0–52.0)
Hemoglobin: 11.7 g/dL — ABNORMAL LOW (ref 13.0–17.0)
Lymphocytes Relative: 11 % — ABNORMAL LOW (ref 12–46)
Lymphs Abs: 1.8 10*3/uL (ref 0.7–4.0)
MCH: 31.5 pg (ref 26.0–34.0)
MCHC: 33.1 g/dL (ref 30.0–36.0)
MCV: 95.1 fL (ref 78.0–100.0)
Monocytes Absolute: 1.5 10*3/uL — ABNORMAL HIGH (ref 0.1–1.0)
Monocytes Relative: 9 % (ref 3–12)
Neutro Abs: 13 10*3/uL — ABNORMAL HIGH (ref 1.7–7.7)
Neutrophils Relative %: 80 % — ABNORMAL HIGH (ref 43–77)
PLATELETS: 306 10*3/uL (ref 150–400)
RBC: 3.71 MIL/uL — ABNORMAL LOW (ref 4.22–5.81)
RDW: 12.6 % (ref 11.5–15.5)
WBC: 16.4 10*3/uL — ABNORMAL HIGH (ref 4.0–10.5)

## 2014-08-31 LAB — BASIC METABOLIC PANEL
ANION GAP: 8 (ref 5–15)
BUN: 11 mg/dL (ref 6–20)
CALCIUM: 8.5 mg/dL — AB (ref 8.9–10.3)
CO2: 26 mmol/L (ref 22–32)
CREATININE: 1.08 mg/dL (ref 0.61–1.24)
Chloride: 100 mmol/L — ABNORMAL LOW (ref 101–111)
GFR calc Af Amer: >60 mL/min (ref >60)
GFR calc non Af Amer: >60 mL/min (ref >60)
Glucose, Bld: 304 mg/dL — ABNORMAL HIGH (ref 70–99)
Potassium: 3.4 mmol/L — ABNORMAL LOW (ref 3.5–5.1)
Sodium: 134 mmol/L — ABNORMAL LOW (ref 135–145)

## 2014-08-31 LAB — URINALYSIS, ROUTINE W REFLEX MICROSCOPIC
Bilirubin Urine: NEGATIVE
Glucose, UA: >1000 mg/dL — AB
KETONES UR: NEGATIVE mg/dL
Leukocytes, UA: NEGATIVE
Nitrite: NEGATIVE
PH: 5.5 (ref 5.0–8.0)
PROTEIN: NEGATIVE mg/dL
Urobilinogen, UA: 0.2 mg/dL (ref 0.0–1.0)

## 2014-08-31 LAB — I-STAT CG4 LACTIC ACID, ED: Lactic Acid, Venous: 1.9 mmol/L (ref 0.5–2.0)

## 2014-08-31 LAB — GLUCOSE, CAPILLARY
GLUCOSE-CAPILLARY: 225 mg/dL — AB (ref 70–99)
Glucose-Capillary: 197 mg/dL — ABNORMAL HIGH (ref 70–99)

## 2014-08-31 LAB — URINE MICROSCOPIC-ADD ON

## 2014-08-31 MED ORDER — BENZTROPINE MESYLATE 1 MG PO TABS
0.5000 mg | ORAL_TABLET | Freq: Two times a day (BID) | ORAL | Status: DC
  Administered 2014-08-31 – 2014-09-05 (×10): 0.5 mg via ORAL
  Filled 2014-08-31 (×10): qty 1

## 2014-08-31 MED ORDER — ADULT MULTIVITAMIN W/MINERALS CH
1.0000 | ORAL_TABLET | Freq: Every day | ORAL | Status: DC
  Administered 2014-08-31 – 2014-09-05 (×5): 1 via ORAL
  Filled 2014-08-31 (×5): qty 1

## 2014-08-31 MED ORDER — SODIUM CHLORIDE 0.9 % IV BOLUS (SEPSIS)
500.0000 mL | INTRAVENOUS | Status: AC
  Administered 2014-08-31: 500 mL via INTRAVENOUS

## 2014-08-31 MED ORDER — PIPERACILLIN-TAZOBACTAM 3.375 G IVPB
3.3750 g | Freq: Three times a day (TID) | INTRAVENOUS | Status: DC
  Administered 2014-08-31 – 2014-09-03 (×10): 3.375 g via INTRAVENOUS
  Filled 2014-08-31 (×12): qty 50

## 2014-08-31 MED ORDER — HEPARIN SODIUM (PORCINE) 5000 UNIT/ML IJ SOLN
5000.0000 [IU] | Freq: Three times a day (TID) | INTRAMUSCULAR | Status: DC
  Administered 2014-08-31 – 2014-09-02 (×6): 5000 [IU] via SUBCUTANEOUS
  Filled 2014-08-31 (×5): qty 1

## 2014-08-31 MED ORDER — POTASSIUM CHLORIDE IN NACL 20-0.9 MEQ/L-% IV SOLN
INTRAVENOUS | Status: DC
  Administered 2014-08-31 – 2014-09-03 (×4): via INTRAVENOUS

## 2014-08-31 MED ORDER — INSULIN ASPART 100 UNIT/ML ~~LOC~~ SOLN: 0.0000 [IU] | Freq: Every day | SUBCUTANEOUS | Status: DC

## 2014-08-31 MED ORDER — HYDROXYZINE HCL 25 MG PO TABS
50.0000 mg | ORAL_TABLET | Freq: Three times a day (TID) | ORAL | Status: DC
  Administered 2014-08-31 – 2014-09-05 (×14): 50 mg via ORAL
  Filled 2014-08-31 (×14): qty 2

## 2014-08-31 MED ORDER — PAROXETINE HCL 20 MG PO TABS
10.0000 mg | ORAL_TABLET | Freq: Two times a day (BID) | ORAL | Status: DC
  Administered 2014-08-31 – 2014-09-05 (×10): 10 mg via ORAL
  Filled 2014-08-31 (×10): qty 1

## 2014-08-31 MED ORDER — ONDANSETRON HCL 4 MG/2ML IJ SOLN: 4.0000 mg | Freq: Four times a day (QID) | INTRAMUSCULAR | Status: DC | PRN

## 2014-08-31 MED ORDER — HYDROMORPHONE HCL 1 MG/ML IJ SOLN
0.5000 mg | INTRAMUSCULAR | Status: DC | PRN
  Administered 2014-08-31 – 2014-09-05 (×21): 0.5 mg via INTRAVENOUS
  Filled 2014-08-31 (×21): qty 1

## 2014-08-31 MED ORDER — VANCOMYCIN HCL IN DEXTROSE 1-5 GM/200ML-% IV SOLN
1000.0000 mg | Freq: Two times a day (BID) | INTRAVENOUS | Status: DC
  Administered 2014-08-31 – 2014-09-04 (×8): 1000 mg via INTRAVENOUS
  Filled 2014-08-31 (×10): qty 200

## 2014-08-31 MED ORDER — SODIUM CHLORIDE 0.9 % IV BOLUS (SEPSIS)
1000.0000 mL | INTRAVENOUS | Status: AC
  Administered 2014-08-31 (×2): 1000 mL via INTRAVENOUS

## 2014-08-31 MED ORDER — INSULIN GLARGINE 100 UNIT/ML ~~LOC~~ SOLN
18.0000 [IU] | Freq: Every day | SUBCUTANEOUS | Status: DC
  Administered 2014-08-31: 18 [IU] via SUBCUTANEOUS
  Filled 2014-08-31 (×3): qty 0.18

## 2014-08-31 MED ORDER — VANCOMYCIN HCL IN DEXTROSE 1-5 GM/200ML-% IV SOLN
1000.0000 mg | Freq: Once | INTRAVENOUS | Status: AC
  Administered 2014-08-31: 1000 mg via INTRAVENOUS
  Filled 2014-08-31: qty 200

## 2014-08-31 MED ORDER — OXYCODONE HCL 5 MG PO TABS
5.0000 mg | ORAL_TABLET | ORAL | Status: DC | PRN
  Administered 2014-08-31 – 2014-09-01 (×2): 5 mg via ORAL
  Filled 2014-08-31 (×2): qty 1

## 2014-08-31 MED ORDER — INSULIN ASPART 100 UNIT/ML ~~LOC~~ SOLN
0.0000 [IU] | Freq: Three times a day (TID) | SUBCUTANEOUS | Status: DC
  Administered 2014-08-31: 5 [IU] via SUBCUTANEOUS

## 2014-08-31 MED ORDER — ONDANSETRON HCL 4 MG PO TABS: 4.0000 mg | ORAL_TABLET | Freq: Four times a day (QID) | ORAL | Status: DC | PRN

## 2014-08-31 MED ORDER — ACETAMINOPHEN 650 MG RE SUPP: 650.0000 mg | Freq: Four times a day (QID) | RECTAL | Status: DC | PRN

## 2014-08-31 MED ORDER — HYDROMORPHONE HCL 1 MG/ML IJ SOLN
0.5000 mg | Freq: Once | INTRAMUSCULAR | Status: AC
Start: 2014-08-31 — End: 2014-08-31
  Administered 2014-08-31: 0.5 mg via INTRAVENOUS
  Filled 2014-08-31: qty 1

## 2014-08-31 MED ORDER — TRAZODONE HCL 50 MG PO TABS
100.0000 mg | ORAL_TABLET | Freq: Every day | ORAL | Status: DC
  Administered 2014-08-31 – 2014-09-04 (×5): 100 mg via ORAL
  Filled 2014-08-31 (×5): qty 2

## 2014-08-31 MED ORDER — ACETAMINOPHEN 325 MG PO TABS: 650.0000 mg | ORAL_TABLET | Freq: Four times a day (QID) | ORAL | Status: DC | PRN

## 2014-08-31 MED ORDER — ACETAMINOPHEN 500 MG PO TABS
1000.0000 mg | ORAL_TABLET | Freq: Once | ORAL | Status: AC
Start: 2014-08-31 — End: 2014-08-31
  Administered 2014-08-31: 1000 mg via ORAL
  Filled 2014-08-31: qty 2

## 2014-08-31 MED ORDER — PIPERACILLIN-TAZOBACTAM 3.375 G IVPB
3.3750 g | Freq: Once | INTRAVENOUS | Status: AC
  Administered 2014-08-31: 3.375 g via INTRAVENOUS
  Filled 2014-08-31: qty 50

## 2014-08-31 NOTE — Progress Notes (Signed)
ANTIBIOTIC CONSULT NOTE  Pharmacy Consult for Vancomycin & Zosyn Indication: cellulitis, osteomyelitis  No Known Allergies  Patient Measurements: Height: 5\' 10"  (177.8 cm) Weight: 165 lb (74.844 kg) IBW/kg (Calculated) : 73  Vital Signs: Temp: 101 F (38.3 C) (05/06 1005) Temp Source: Rectal (05/06 1005) BP: 103/61 mmHg (05/06 1200) Pulse Rate: 62 (05/06 1200) Intake/Output from previous day:   Intake/Output from this shift:    Labs:  Recent Labs  08/31/14 0820  WBC 16.4*  HGB 11.7*  PLT 306  CREATININE 1.08   Estimated Creatinine Clearance: 77.9 mL/min (by C-G formula based on Cr of 1.08). No results for input(s): VANCOTROUGH, VANCOPEAK, VANCORANDOM, GENTTROUGH, GENTPEAK, GENTRANDOM, TOBRATROUGH, TOBRAPEAK, TOBRARND, AMIKACINPEAK, AMIKACINTROU, AMIKACIN in the last 72 hours.   Microbiology: No results found for this or any previous visit (from the past 720 hour(s)).  Anti-infectives    Start     Dose/Rate Route Frequency Ordered Stop   08/31/14 0830  piperacillin-tazobactam (ZOSYN) IVPB 3.375 g     3.375 g 12.5 mL/hr over 240 Minutes Intravenous  Once 08/31/14 0824 08/31/14 1216   08/31/14 0830  vancomycin (VANCOCIN) IVPB 1000 mg/200 mL premix     1,000 mg 200 mL/hr over 60 Minutes Intravenous  Once 08/31/14 [id number] 08/31/14 1107      Assessment: 59 yo diabetic M with right foot pain & swelling.  He is febrile on admission (Tm 101F) with elevated WBC.  Lactic acid normal.   08/31/2014: foot xray + osteo at 1st & 2nd interphalangeal joint.  He completed course of Augmentin ~ 1 month ago and another antibiotic ~ 2 weeks ago that he cannot recall name.   Cx data pending.   Renal function at patient's baseline.   Goal of Therapy:  Vancomycin trough level 15-20 mcg/ml  Plan:  Zosyn 3.375gm IV Q8h to be infused over 4hrs Vancomycin 1000mg  IV q12h Check Vancomycin trough at steady state Monitor renal function and cx data   Elson ClanLilliston, Kelsey Edman  Michelle 08/31/2014,12:33 PM
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
## 2014-08-31 NOTE — Progress Notes (Signed)
UR chart review completed.  
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
## 2014-08-31 NOTE — ED Notes (Signed)
Updated lab to report that nurse accidentally clicked off blood cultures and they still need to be drawn.

## 2014-09-01 ENCOUNTER — Inpatient Hospital Stay (HOSPITAL_COMMUNITY): Payer: Medicaid Other

## 2014-09-01 LAB — CBC
HEMATOCRIT: 31.9 % — AB (ref 39.0–52.0)
Hemoglobin: 10.4 g/dL — ABNORMAL LOW (ref 13.0–17.0)
MCH: 31.6 pg (ref 26.0–34.0)
MCHC: 32.6 g/dL (ref 30.0–36.0)
MCV: 97 fL (ref 78.0–100.0)
Platelets: 229 10*3/uL (ref 150–400)
RBC: 3.29 MIL/uL — ABNORMAL LOW (ref 4.22–5.81)
RDW: 12.7 % (ref 11.5–15.5)
WBC: 14.5 10*3/uL — ABNORMAL HIGH (ref 4.0–10.5)

## 2014-09-01 LAB — BASIC METABOLIC PANEL
Anion gap: 6 (ref 5–15)
BUN: 14 mg/dL (ref 6–20)
CALCIUM: 7.7 mg/dL — AB (ref 8.9–10.3)
CO2: 23 mmol/L (ref 22–32)
CREATININE: 1.18 mg/dL (ref 0.61–1.24)
Chloride: 103 mmol/L (ref 101–111)
Glucose, Bld: 218 mg/dL — ABNORMAL HIGH (ref 70–99)
Potassium: 3.8 mmol/L (ref 3.5–5.1)
Sodium: 132 mmol/L — ABNORMAL LOW (ref 135–145)

## 2014-09-01 LAB — GLUCOSE, CAPILLARY
GLUCOSE-CAPILLARY: 73 mg/dL (ref 70–99)
GLUCOSE-CAPILLARY: 215 mg/dL — AB (ref 70–99)
Glucose-Capillary: 195 mg/dL — ABNORMAL HIGH (ref 70–99)
Glucose-Capillary: 158 mg/dL — ABNORMAL HIGH (ref 70–99)

## 2014-09-01 LAB — CLOSTRIDIUM DIFFICILE BY PCR: CDIFFPCR: POSITIVE — AB

## 2014-09-01 MED ORDER — INSULIN ASPART 100 UNIT/ML ~~LOC~~ SOLN
0.0000 [IU] | Freq: Three times a day (TID) | SUBCUTANEOUS | Status: DC
  Administered 2014-09-01: 2 [IU] via SUBCUTANEOUS
  Administered 2014-09-01: 3 [IU] via SUBCUTANEOUS
  Administered 2014-09-02: 1 [IU] via SUBCUTANEOUS
  Administered 2014-09-04: 3 [IU] via SUBCUTANEOUS
  Administered 2014-09-04: 5 [IU] via SUBCUTANEOUS
  Administered 2014-09-05: 1 [IU] via SUBCUTANEOUS
  Administered 2014-09-05: 2 [IU] via SUBCUTANEOUS

## 2014-09-01 MED ORDER — INSULIN DETEMIR 100 UNIT/ML ~~LOC~~ SOLN
10.0000 [IU] | Freq: Every day | SUBCUTANEOUS | Status: DC
  Administered 2014-09-01 – 2014-09-02 (×2): 10 [IU] via SUBCUTANEOUS
  Filled 2014-09-01 (×3): qty 0.1

## 2014-09-01 MED ORDER — OXYCODONE HCL 5 MG PO TABS
10.0000 mg | ORAL_TABLET | ORAL | Status: DC | PRN
  Administered 2014-09-01 – 2014-09-05 (×16): 10 mg via ORAL
  Filled 2014-09-01 (×16): qty 2

## 2014-09-01 MED ORDER — INSULIN ASPART 100 UNIT/ML ~~LOC~~ SOLN: 0.0000 [IU] | Freq: Every day | SUBCUTANEOUS | Status: DC

## 2014-09-01 MED ORDER — METRONIDAZOLE 500 MG PO TABS
500.0000 mg | ORAL_TABLET | Freq: Three times a day (TID) | ORAL | Status: DC
  Administered 2014-09-01 – 2014-09-04 (×8): 500 mg via ORAL
  Filled 2014-09-01 (×8): qty 1
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
## 2014-09-01 NOTE — Plan of Care (Signed)
Patient positive for CDIFF. Already in Enteric precautions - Dr informed.

## 2014-09-02 DIAGNOSIS — F1121 Opioid dependence, in remission: Secondary | ICD-10-CM

## 2014-09-02 LAB — CBC
HCT: 29.3 % — ABNORMAL LOW (ref 39.0–52.0)
Hemoglobin: 9.9 g/dL — ABNORMAL LOW (ref 13.0–17.0)
MCH: 32.1 pg (ref 26.0–34.0)
MCHC: 33.8 g/dL (ref 30.0–36.0)
MCV: 95.1 fL (ref 78.0–100.0)
Platelets: 231 10*3/uL (ref 150–400)
RBC: 3.08 MIL/uL — ABNORMAL LOW (ref 4.22–5.81)
RDW: 12.8 % (ref 11.5–15.5)
WBC: 14.6 10*3/uL — AB (ref 4.0–10.5)

## 2014-09-02 LAB — BASIC METABOLIC PANEL
ANION GAP: 6 (ref 5–15)
BUN: 14 mg/dL (ref 6–20)
CALCIUM: 7.8 mg/dL — AB (ref 8.9–10.3)
CO2: 25 mmol/L (ref 22–32)
CREATININE: 1.05 mg/dL (ref 0.61–1.24)
Chloride: 105 mmol/L (ref 101–111)
GFR calc Af Amer: >60 mL/min (ref >60)
GFR calc non Af Amer: >60 mL/min (ref >60)
Glucose, Bld: 45 mg/dL — ABNORMAL LOW (ref 70–99)
Potassium: 3.4 mmol/L — ABNORMAL LOW (ref 3.5–5.1)
Sodium: 136 mmol/L (ref 135–145)

## 2014-09-02 LAB — GLUCOSE, CAPILLARY
GLUCOSE-CAPILLARY: 150 mg/dL — AB (ref 70–99)
Glucose-Capillary: 56 mg/dL — ABNORMAL LOW (ref 70–99)
Glucose-Capillary: 78 mg/dL (ref 70–99)
Glucose-Capillary: 240 mg/dL — ABNORMAL HIGH (ref 70–99)

## 2014-09-02 LAB — URINE CULTURE
Colony Count: NO GROWTH
Culture: NO GROWTH

## 2014-09-02 LAB — TYPE AND SCREEN
ABO/RH(D): O POS
Antibody Screen: NEGATIVE

## 2014-09-02 MED ORDER — POTASSIUM CHLORIDE CRYS ER 20 MEQ PO TBCR
20.0000 meq | EXTENDED_RELEASE_TABLET | Freq: Two times a day (BID) | ORAL | Status: DC
  Administered 2014-09-02 – 2014-09-05 (×6): 20 meq via ORAL
  Filled 2014-09-02 (×5): qty 1

## 2014-09-02 MED ORDER — CHLORHEXIDINE GLUCONATE 4 % EX LIQD
1.0000 "application " | Freq: Once | CUTANEOUS | Status: AC
  Administered 2014-09-02: 1 via TOPICAL
  Filled 2014-09-02: qty 15

## 2014-09-02 MED ORDER — ENOXAPARIN SODIUM 40 MG/0.4ML ~~LOC~~ SOLN
40.0000 mg | SUBCUTANEOUS | Status: DC
  Administered 2014-09-02: 40 mg via SUBCUTANEOUS
  Filled 2014-09-02: qty 0.4

## 2014-09-02 MED ORDER — SACCHAROMYCES BOULARDII 250 MG PO CAPS
250.0000 mg | ORAL_CAPSULE | Freq: Two times a day (BID) | ORAL | Status: DC
  Administered 2014-09-02 – 2014-09-05 (×6): 250 mg via ORAL
  Filled 2014-09-02 (×6): qty 1

## 2014-09-02 MED ORDER — POTASSIUM CHLORIDE CRYS ER 20 MEQ PO TBCR
40.0000 meq | EXTENDED_RELEASE_TABLET | Freq: Once | ORAL | Status: DC
  Filled 2014-09-02: qty 2

## 2014-09-02 NOTE — Progress Notes (Signed)
Accucheck 56 patient alert and orient. Skin warm and dry. Snack provide until breakfast tray delivered.

## 2014-09-02 NOTE — Progress Notes (Signed)
We'll proceed with right great toe and second toe amputations tomorrow. The risks and benefits of the procedure were fully explained to the patient, who gave informed consent. Orders have been written.
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
## 2014-09-03 ENCOUNTER — Inpatient Hospital Stay (HOSPITAL_COMMUNITY): Payer: Medicaid Other | Admitting: Anesthesiology

## 2014-09-03 ENCOUNTER — Encounter (HOSPITAL_COMMUNITY)
Admission: EM | Disposition: A | Discharge status: Home / Self Care | Payer: Self-pay | Source: Home / Self Care | Attending: Internal Medicine

## 2014-09-03 ENCOUNTER — Encounter (HOSPITAL_COMMUNITY): Payer: Self-pay

## 2014-09-03 HISTORY — PX: AMPUTATION TOE: SHX6595

## 2014-09-03 LAB — GLUCOSE, CAPILLARY
GLUCOSE-CAPILLARY: 94 mg/dL (ref 70–99)
GLUCOSE-CAPILLARY: 133 mg/dL — AB (ref 70–99)
GLUCOSE-CAPILLARY: 54 mg/dL — AB (ref 70–99)
GLUCOSE-CAPILLARY: 69 mg/dL — AB (ref 70–99)
GLUCOSE-CAPILLARY: 295 mg/dL — AB (ref 70–99)
Glucose-Capillary: 192 mg/dL — ABNORMAL HIGH (ref 70–99)
Glucose-Capillary: 297 mg/dL — ABNORMAL HIGH (ref 70–99)
Glucose-Capillary: 56 mg/dL — ABNORMAL LOW (ref 70–99)
Glucose-Capillary: 92 mg/dL (ref 70–99)

## 2014-09-03 LAB — SURGICAL PCR SCREEN
MRSA, PCR: NEGATIVE
Staphylococcus aureus: NEGATIVE

## 2014-09-03 LAB — CBC
HEMATOCRIT: 31.8 % — AB (ref 39.0–52.0)
Hemoglobin: 10.6 g/dL — ABNORMAL LOW (ref 13.0–17.0)
MCH: 31.8 pg (ref 26.0–34.0)
MCHC: 33.3 g/dL (ref 30.0–36.0)
MCV: 95.5 fL (ref 78.0–100.0)
PLATELETS: 235 10*3/uL (ref 150–400)
RBC: 3.33 MIL/uL — ABNORMAL LOW (ref 4.22–5.81)
RDW: 12.7 % (ref 11.5–15.5)
WBC: 12.4 10*3/uL — AB (ref 4.0–10.5)

## 2014-09-03 LAB — WOUND CULTURE

## 2014-09-03 LAB — BASIC METABOLIC PANEL
ANION GAP: 5 (ref 5–15)
BUN: 13 mg/dL (ref 6–20)
CALCIUM: 8.4 mg/dL — AB (ref 8.9–10.3)
CO2: 27 mmol/L (ref 22–32)
CREATININE: 1.17 mg/dL (ref 0.61–1.24)
Chloride: 106 mmol/L (ref 101–111)
GFR calc Af Amer: >60 mL/min (ref >60)
GLUCOSE: 60 mg/dL — AB (ref 70–99)
Potassium: 4.1 mmol/L (ref 3.5–5.1)
Sodium: 138 mmol/L (ref 135–145)

## 2014-09-03 LAB — HEMOGLOBIN A1C
Hgb A1c MFr Bld: 12.7 % — ABNORMAL HIGH (ref 4.8–5.6)
Mean Plasma Glucose: 318 mg/dL

## 2014-09-03 SURGERY — AMPUTATION, TOE
Anesthesia: General | Site: Foot | Laterality: Right

## 2014-09-03 MED ORDER — DEXTROSE 50 % IV SOLN
INTRAVENOUS | Status: AC
  Administered 2014-09-03: 50 mL
  Filled 2014-09-03: qty 50

## 2014-09-03 MED ORDER — ONDANSETRON HCL 4 MG/2ML IJ SOLN
4.0000 mg | Freq: Once | INTRAMUSCULAR | Status: AC
  Administered 2014-09-03: 4 mg via INTRAVENOUS

## 2014-09-03 MED ORDER — INSULIN ASPART 100 UNIT/ML ~~LOC~~ SOLN
3.0000 [IU] | Freq: Once | SUBCUTANEOUS | Status: AC
  Administered 2014-09-03: 3 [IU] via SUBCUTANEOUS

## 2014-09-03 MED ORDER — POVIDONE-IODINE 10 % EX OINT
TOPICAL_OINTMENT | CUTANEOUS | Status: AC
  Filled 2014-09-03: qty 1

## 2014-09-03 MED ORDER — LACTATED RINGERS IV SOLN
INTRAVENOUS | Status: DC
  Administered 2014-09-03: 13:00:00 via INTRAVENOUS

## 2014-09-03 MED ORDER — LACTATED RINGERS IV SOLN
INTRAVENOUS | Status: DC | PRN
  Administered 2014-09-03 (×2): via INTRAVENOUS

## 2014-09-03 MED ORDER — DEXTROSE-NACL 5-0.9 % IV SOLN
INTRAVENOUS | Status: DC
  Administered 2014-09-03 – 2014-09-04 (×2): via INTRAVENOUS

## 2014-09-03 MED ORDER — EPHEDRINE SULFATE 50 MG/ML IJ SOLN
INTRAMUSCULAR | Status: DC | PRN
  Administered 2014-09-03 (×3): 10 mg via INTRAVENOUS

## 2014-09-03 MED ORDER — ONDANSETRON HCL 4 MG/2ML IJ SOLN: 4.0000 mg | Freq: Once | INTRAMUSCULAR | Status: DC | PRN

## 2014-09-03 MED ORDER — ENOXAPARIN SODIUM 40 MG/0.4ML ~~LOC~~ SOLN
40.0000 mg | SUBCUTANEOUS | Status: DC
  Administered 2014-09-04 – 2014-09-05 (×2): 40 mg via SUBCUTANEOUS
  Filled 2014-09-03 (×2): qty 0.4

## 2014-09-03 MED ORDER — KETOROLAC TROMETHAMINE 30 MG/ML IJ SOLN
30.0000 mg | Freq: Once | INTRAMUSCULAR | Status: AC
  Administered 2014-09-03: 30 mg via INTRAVENOUS

## 2014-09-03 MED ORDER — MIDAZOLAM HCL 2 MG/2ML IJ SOLN
1.0000 mg | INTRAMUSCULAR | Status: DC | PRN
Start: 2014-09-03 — End: 2014-09-03
  Administered 2014-09-03: 2 mg via INTRAVENOUS

## 2014-09-03 MED ORDER — KETOROLAC TROMETHAMINE 30 MG/ML IJ SOLN
INTRAMUSCULAR | Status: AC
Start: 2014-09-03 — End: 2014-09-03
  Filled 2014-09-03: qty 1

## 2014-09-03 MED ORDER — FENTANYL CITRATE (PF) 100 MCG/2ML IJ SOLN
INTRAMUSCULAR | Status: AC
  Filled 2014-09-03: qty 2

## 2014-09-03 MED ORDER — DEXTROSE-NACL 5-0.9 % IV SOLN
INTRAVENOUS | Status: DC
  Filled 2014-09-03 (×3): qty 1000

## 2014-09-03 MED ORDER — EPHEDRINE SULFATE 50 MG/ML IJ SOLN
INTRAMUSCULAR | Status: AC
  Filled 2014-09-03: qty 1

## 2014-09-03 MED ORDER — GLYCOPYRROLATE 0.2 MG/ML IJ SOLN
INTRAMUSCULAR | Status: DC | PRN
  Administered 2014-09-03: 0.2 mg via INTRAVENOUS

## 2014-09-03 MED ORDER — SUCCINYLCHOLINE CHLORIDE 20 MG/ML IJ SOLN
INTRAMUSCULAR | Status: AC
  Filled 2014-09-03: qty 1

## 2014-09-03 MED ORDER — FENTANYL CITRATE (PF) 100 MCG/2ML IJ SOLN: 25.0000 ug | INTRAMUSCULAR | Status: DC | PRN

## 2014-09-03 MED ORDER — ONDANSETRON HCL 4 MG/2ML IJ SOLN
INTRAMUSCULAR | Status: AC
  Filled 2014-09-03: qty 2

## 2014-09-03 MED ORDER — MIDAZOLAM HCL 2 MG/2ML IJ SOLN
INTRAMUSCULAR | Status: AC
  Filled 2014-09-03: qty 2

## 2014-09-03 MED ORDER — PROPOFOL 10 MG/ML IV BOLUS
INTRAVENOUS | Status: AC
  Filled 2014-09-03: qty 20

## 2014-09-03 MED ORDER — POVIDONE-IODINE 10 % OINT PACKET
TOPICAL_OINTMENT | CUTANEOUS | Status: DC | PRN
  Administered 2014-09-03: 1 via TOPICAL

## 2014-09-03 MED ORDER — LIDOCAINE HCL (PF) 1 % IJ SOLN
INTRAMUSCULAR | Status: AC
  Filled 2014-09-03: qty 5

## 2014-09-03 MED ORDER — FENTANYL CITRATE (PF) 100 MCG/2ML IJ SOLN
25.0000 ug | INTRAMUSCULAR | Status: AC
  Administered 2014-09-03 (×2): 25 ug via INTRAVENOUS

## 2014-09-03 MED ORDER — SODIUM CHLORIDE 0.9 % IJ SOLN
INTRAMUSCULAR | Status: AC
  Filled 2014-09-03: qty 10

## 2014-09-03 MED ORDER — 0.9 % SODIUM CHLORIDE (POUR BTL) OPTIME
TOPICAL | Status: DC | PRN
  Administered 2014-09-03: 1000 mL

## 2014-09-03 SURGICAL SUPPLY — 24 items
BANDAGE ELASTIC 4 VELCRO NS (GAUZE/BANDAGES/DRESSINGS) ×2 IMPLANT
BANDAGE GAUZE ELAST BULKY 4 IN (GAUZE/BANDAGES/DRESSINGS) ×2 IMPLANT
BLADE KNIFE PERSONA 15 (BLADE) ×2 IMPLANT
CLOTH BEACON ORANGE TIMEOUT ST (SAFETY) ×2 IMPLANT
COVER LIGHT HANDLE STERIS (MISCELLANEOUS) ×4 IMPLANT
GAUZE XEROFORM 5X9 LF (GAUZE/BANDAGES/DRESSINGS) ×2 IMPLANT
GLOVE BIOGEL PI IND STRL 7.0 (GLOVE) ×1 IMPLANT
GLOVE BIOGEL PI IND STRL 7.5 (GLOVE) ×1 IMPLANT
GLOVE BIOGEL PI INDICATOR 7.0 (GLOVE) ×1
GLOVE BIOGEL PI INDICATOR 7.5 (GLOVE) ×1
GLOVE INDICATOR 7.0 STRL GRN (GLOVE) ×2 IMPLANT
GOWN STRL REUS W/ TWL LRG LVL3 (GOWN DISPOSABLE) ×1 IMPLANT
GOWN STRL REUS W/TWL LRG LVL3 (GOWN DISPOSABLE) ×3 IMPLANT
NS IRRIG 1000ML POUR BTL (IV SOLUTION) ×2 IMPLANT
PACK BASIC LIMB (CUSTOM PROCEDURE TRAY) ×2 IMPLANT
SET BASIN LINEN APH (SET/KITS/TRAYS/PACK) ×2 IMPLANT
SPONGE GAUZE 4X4 12PLY (GAUZE/BANDAGES/DRESSINGS) ×2 IMPLANT
SPONGE LAP 18X18 X RAY DECT (DISPOSABLE) ×2 IMPLANT
SPONGE LAP 4X18 X RAY DECT (DISPOSABLE) ×2 IMPLANT
SUT BONE WAX W31G (SUTURE) ×2 IMPLANT
SUT ETHILON 3 0 FSL (SUTURE) ×2 IMPLANT
SUT PROLENE 2 0 FS (SUTURE) ×8 IMPLANT
SWAB CULTURE LIQ STUART DBL (MISCELLANEOUS) ×2 IMPLANT
TUBE ANAEROBIC PORT A CUL  W/M (MISCELLANEOUS) ×2 IMPLANT

## 2014-09-03 NOTE — Progress Notes (Signed)
Hypoglycemic Event  CBG: 69  Treatment: Dinner  Symptoms: Patient had no signs or complaints of hypoglycemia.  Follow-up CBG: Time:1759 CBG Result:192  Possible Reasons for Event: Patient has no idea what is causing hypoglycemic events at this time. Patient now has a diet and is not NPO.  Comments/MD notified: Dr. Doroteo BradfordMemon    James, Sharmon Leydenaylor Morgan  Remember to initiate Hypoglycemia Order Set & complete

## 2014-09-03 NOTE — Progress Notes (Signed)
Hypoglycemic Event  CBG: 54  Treatment: One AMP of D50.  Symptoms: Patient had no complaints of signs of hypoglycemic event.  Follow-up CBG: Time: OR RN will recheck patients CBG when he arrives to pre-op.   Possible Reasons for Event: Patient is unsure of why blood sugar is continuing to drop.  Comments/MD notified:Dr. Kerry HoughMemon changed patients IV fluids to add in D5.     Ubaldo GlassingJames, Charday Capetillo Morgan  Remember to initiate Hypoglycemia Order Set & complete

## 2014-09-03 NOTE — Progress Notes (Signed)
Received report Almira CoasterGina, RN in PACU. Patient arrived to the unit and was alert and oriented. VS were within normal limits. Patients dressing was clean, dry, and intact. Patient has no complaints at this time. Will continue to monitor patient at this time.

## 2014-09-03 NOTE — Transfer of Care (Signed)
Immediate Anesthesia Transfer of Care Note  Patient: Tyler Burgess  Procedure(s) Performed: Procedure(s) with comments: AMPUTATION RIGHT GREAT TOE AND SECOND TOE (Right) - right great and second toes  Patient Location: PACU  Anesthesia Type:General  Level of Consciousness: awake, alert , oriented and patient cooperative  Airway & Oxygen Therapy: Patient Spontanous Breathing and Patient connected to face mask oxygen  Post-op Assessment: Report given to RN and Post -op Vital signs reviewed and stable  Post vital signs: Reviewed and stable  Last Vitals:  Filed Vitals:   09/03/14 1243  BP:   Pulse:   Temp: 36.7 C  Resp:     Complications: No apparent anesthesia complications

## 2014-09-03 NOTE — Op Note (Signed)
Patient:  Tyler Burgess  DOB:  09/01/1955  MRN:  [id number]   Preop Diagnosis:  Gangrene, right great toe, second toe  Postop Diagnosis:  Same  Procedure:  Transmetatarsal amputation right great toe, amputation of right second toe  Surgeon:  Franky MachoMark Lanecia Sliva, M.D.  Anes:  Gen.  Indications:  Patient is a 59 year old white male multiple medical problems including uncontrolled diabetes mellitus who presents with gangrene of the right great and second toe. There is also evidence of osteomyelitis present. The patient now presents for an amputation of both the right great and second toes. The risks and benefits of the procedure including bleeding and infection were fully explained to the patient, who gave informed consent.  Procedure note:  The patient was placed the supine position. After general anesthesia was administered, the right foot was prepped and draped using the usual sterile technique with Betadine. Surgical site confirmation was performed.  An elliptical incision was made around the base of the right great and second toes. The dissection was taken down to the bone. A transmetatarsal amputation of the right great toe was performed. The second toe was amputated at the metatarsal head. Both were removed in continuity without difficulty. No purulent drainage was present. The toes were sent to pathology further examination. A bleeding was controlled using Bovie elect cautery. The wound was irrigated normal saline. The skin was reapproximated using 203 0 Prolene interrupted sutures. Xeroform was placed along the subcutaneous tissue. Betadine ointment and dry sterile dressing were applied.  All tape and needle counts were correct the end of the procedure. Patient was awakened and transferred to PACU in stable condition.  Complications:  None  EBL:  25 mL  Specimen:  Right great and second toes

## 2014-09-03 NOTE — Progress Notes (Signed)
ANTIBIOTIC CONSULT NOTE  Pharmacy Consult for Vancomycin & Zosyn Indication: osteomyelitis  No Known Allergies  Patient Measurements: Height: 5\' 10"  (177.8 cm) Weight: 189 lb (85.73 kg) IBW/kg (Calculated) : 73  Vital Signs: Temp: 98 F (36.7 C) (05/09 1243) Temp Source: Oral (05/09 1243) BP: 106/58 mmHg (05/09 0658) Pulse Rate: 87 (05/09 0658) Intake/Output from previous day: 05/08 0701 - 05/09 0700 In: 1140 [P.O.:840; IV Piggyback:300] Out: 1500 [Urine:1500] Intake/Output from this shift: Total I/O In: 1000 [I.V.:1000] Out: 25 [Blood:25]  Labs:  Recent Labs  09/01/14 1427 09/02/14 0626 09/03/14 0704  WBC 14.5* 14.6* 12.4*  HGB 10.4* 9.9* 10.6*  PLT 229 231 235  CREATININE 1.18 1.05 1.17   Estimated Creatinine Clearance: 71.9 mL/min (by C-G formula based on Cr of 1.17). No results for input(s): VANCOTROUGH, VANCOPEAK, VANCORANDOM, GENTTROUGH, GENTPEAK, GENTRANDOM, TOBRATROUGH, TOBRAPEAK, TOBRARND, AMIKACINPEAK, AMIKACINTROU, AMIKACIN in the last 72 hours.   Microbiology: Recent Results (from the past 720 hour(s))  Wound culture     Status: None   Collection Time: 08/31/14  8:05 AM  Result Value Ref Range Status   Specimen Description FOOT RIGHT  Final   Special Requests NONE  Final   Gram Stain   Final    RARE WBC PRESENT, PREDOMINANTLY PMN NO SQUAMOUS EPITHELIAL CELLS SEEN MODERATE GRAM POSITIVE COCCI IN PAIRS IN CLUSTERS IN CHAINS Performed at [facility]    Culture   Final    MODERATE STAPHYLOCOCCUS AUREUS Note: RIFAMPIN AND GENTAMICIN SHOULD NOT BE USED AS SINGLE DRUGS FOR TREATMENT OF STAPH INFECTIONS. Performed at [facility]    Report Status 09/03/2014 FINAL  Final   Organism ID, Bacteria STAPHYLOCOCCUS AUREUS  Final      Susceptibility   Staphylococcus aureus - MIC*    CLINDAMYCIN >=8 RESISTANT Resistant     ERYTHROMYCIN >=8 RESISTANT Resistant     GENTAMICIN <=0.5 SENSITIVE Sensitive     LEVOFLOXACIN 0.25 SENSITIVE  Sensitive     OXACILLIN <=0.25 SENSITIVE Sensitive     PENICILLIN <=0.03 SENSITIVE Sensitive     RIFAMPIN <=0.5 SENSITIVE Sensitive     TRIMETH/SULFA <=10 SENSITIVE Sensitive     VANCOMYCIN <=0.5 SENSITIVE Sensitive     TETRACYCLINE <=1 SENSITIVE Sensitive     MOXIFLOXACIN <=0.25 SENSITIVE Sensitive     * MODERATE STAPHYLOCOCCUS AUREUS  Culture, blood (routine x 2)     Status: None (Preliminary result)   Collection Time: 08/31/14  8:25 AM  Result Value Ref Range Status   Specimen Description LEFT ANTECUBITAL  Final   Special Requests BOTTLES DRAWN AEROBIC AND ANAEROBIC 6CC  Final   Culture NO GROWTH 3 DAYS  Final   Report Status PENDING  Incomplete  Culture, blood (routine x 2)     Status: None (Preliminary result)   Collection Time: 08/31/14  8:27 AM  Result Value Ref Range Status   Specimen Description BLOOD RIGHT ARM  Final   Special Requests BOTTLES DRAWN AEROBIC AND ANAEROBIC 6CC  Final   Culture NO GROWTH 3 DAYS  Final   Report Status PENDING  Incomplete  Urine culture     Status: None   Collection Time: 08/31/14  9:05 AM  Result Value Ref Range Status   Specimen Description URINE, RANDOM  Final   Special Requests NONE  Final   Colony Count NO GROWTH Performed at [facility]   Final   Culture NO GROWTH Performed at [facility]   Final   Report Status 09/02/2014 FINAL  Final  Clostridium Difficile by PCR     Status: Abnormal   Collection Time: 09/01/14  6:15 AM  Result Value Ref Range Status   C difficile by pcr POSITIVE (A) NEGATIVE Final    Comment: CRITICAL RESULT CALLED TO, READ BACK BY AND VERIFIED WITH: Alberteen SpindleWRIGHT, M AT [id number] ON 09/01/14 BY WOODS, M    Surgical pcr screen     Status: None   Collection Time: 09/03/14  6:33 AM  Result Value Ref Range Status   MRSA, PCR NEGATIVE NEGATIVE Final   Staphylococcus aureus NEGATIVE NEGATIVE Final    Comment:        The Xpert SA Assay (FDA approved for NASAL specimens in patients over 21 years of  age), is one component of a comprehensive surveillance program.  Test performance has been validated by [facility] for patients greater than or equal to 57 year old. It is not intended to diagnose infection nor to guide or monitor treatment.     Anti-infectives    Start     Dose/Rate Route Frequency Ordered Stop   09/01/14 1400  [MAR Hold]  metroNIDAZOLE (FLAGYL) tablet 500 mg     (MAR Hold since 09/03/14 1206)   500 mg Oral 3 times per day 09/01/14 1008 09/15/14 1359   08/31/14 2100  [MAR Hold]  vancomycin (VANCOCIN) IVPB 1000 mg/200 mL premix     (MAR Hold since 09/03/14 1206)   1,000 mg 200 mL/hr over 60 Minutes Intravenous Every 12 hours 08/31/14 1343     08/31/14 1600  [MAR Hold]  piperacillin-tazobactam (ZOSYN) IVPB 3.375 g     (MAR Hold since 09/03/14 1206)   3.375 g 12.5 mL/hr over 240 Minutes Intravenous Every 8 hours 08/31/14 1343     08/31/14 0830  piperacillin-tazobactam (ZOSYN) IVPB 3.375 g     3.375 g 12.5 mL/hr over 240 Minutes Intravenous  Once 08/31/14 0824 08/31/14 1216   08/31/14 0830  vancomycin (VANCOCIN) IVPB 1000 mg/200 mL premix     1,000 mg 200 mL/hr over 60 Minutes Intravenous  Once 08/31/14 0824 08/31/14 1107      Assessment: 59 yo diabetic M with right foot pain & swelling.   08/31/2014: foot xray + osteo at 1st & 2nd interphalangeal joint.  He completed course of Augmentin ~ 1 month ago and another antibiotic ~ 2 weeks ago that he cannot recall name.   Wound cx + MSSA, Blood cx NGTD.   Renal function remains stable at patient's baseline.  Patient also now +Cdiff on Flagyl.  He is undergoing amputation of toes today-->anticipate can d/c antibiotics post-op if infection removed.  Goal of Therapy:  Vancomycin trough level 15-20 mcg/ml  Plan:  Zosyn 3.375gm IV Q8h to be infused over 4hrs Vancomycin 1000mg  IV q12h Check Vancomycin trough at steady state (defer for now since anticipate d/c abx soon) Monitor renal function and cx data  Duration of  therapy per MD- recommend to minimize course of V/Z given that patient is +Cdiff.   Elson ClanLilliston, Jayah Balthazar Michelle 09/03/2014,1:59 PM

## 2014-09-03 NOTE — Clinical Social Work Note (Signed)
CSW received referral as pt is homeless. Pt is currently in surgery. Will assess tomorrow.  Derenda FennelKara Iyahna Obriant, [place] [phone]

## 2014-09-03 NOTE — Anesthesia Postprocedure Evaluation (Signed)
  Anesthesia Post-op Note  Patient: Tyler Burgess  Procedure(s) Performed: Procedure(s) with comments: AMPUTATION RIGHT GREAT TOE AND SECOND TOE (Right) - right great and second toes  Patient Location: PACU  Anesthesia Type:General  Level of Consciousness: awake, alert , oriented and patient cooperative  Airway and Oxygen Therapy: Patient Spontanous Breathing  Post-op Pain: moderate  Post-op Assessment: Post-op Vital signs reviewed, Patient's Cardiovascular Status Stable, Respiratory Function Stable, Patent Airway, No signs of Nausea or vomiting and Pain level controlled  Post-op Vital Signs: Reviewed and stable  Last Vitals:  Filed Vitals:   09/03/14 1416  BP:   Pulse:   Temp: 36.7 C  Resp:     Complications: No apparent anesthesia complications

## 2014-09-03 NOTE — Anesthesia Procedure Notes (Signed)
Procedure Name: LMA Insertion Date/Time: 09/03/2014 1:24 PM Performed by: Pernell DupreADAMS, AMY A Pre-anesthesia Checklist: Patient identified, Timeout performed, Emergency Drugs available, Suction available and Patient being monitored Patient Re-evaluated:Patient Re-evaluated prior to inductionOxygen Delivery Method: Circle system utilized Preoxygenation: Pre-oxygenation with 100% oxygen Intubation Type: IV induction Ventilation: Mask ventilation without difficulty LMA: LMA inserted LMA Size: 5.0 Number of attempts: 1 Placement Confirmation: positive ETCO2 and breath sounds checked- equal and bilateral Tube secured with: Tape Dental Injury: Teeth and Oropharynx as per pre-operative assessment

## 2014-09-03 NOTE — Anesthesia Preprocedure Evaluation (Signed)
Anesthesia Evaluation  Patient identified by MRN, date of birth, ID band Patient awake    Reviewed: Allergy & Precautions, NPO status , Patient's Chart, lab work & pertinent test results  Airway Mallampati: II  TM Distance: >3 FB     Dental  (+) Edentulous Upper, Edentulous Lower   Pulmonary Current Smoker,  breath sounds clear to auscultation        Cardiovascular negative cardio ROS  Rhythm:Regular Rate:Normal     Neuro/Psych PSYCHIATRIC DISORDERS Depression Bipolar Disorder  Neuromuscular disease    GI/Hepatic negative GI ROS, (+)     substance abuse  alcohol use,   Endo/Other  diabetes, Type 2, Oral Hypoglycemic Agents  Renal/GU      Musculoskeletal   Abdominal   Peds  Hematology   Anesthesia Other Findings   Reproductive/Obstetrics                             Anesthesia Physical Anesthesia Plan  ASA: III  Anesthesia Plan: General   Post-op Pain Management:    Induction: Intravenous  Airway Management Planned: LMA  Additional Equipment:   Intra-op Plan:   Post-operative Plan: Extubation in OR  Informed Consent: I have reviewed the patients History and Physical, chart, labs and discussed the procedure including the risks, benefits and alternatives for the proposed anesthesia with the patient or authorized representative who has indicated his/her understanding and acceptance.     Plan Discussed with:   Anesthesia Plan Comments:         Anesthesia Quick Evaluation

## 2014-09-03 NOTE — Care Management Note (Signed)
Case Management Note  Patient Details  Name: Tyler LoganBilly Burgess MRN: [id number] Date of Birth: 10/05/1955  Subjective/Objective:                  Pt admitted with osteomylitis. Pt is currently homeless and lives out of his truck. Pt stated that at discharge [place]   Action/Plan: CSW is aware of pt. Will need to follow for discharge planning needs. Pt having amputation today of toes.  Expected Discharge Date:                  Expected Discharge Plan:  Homeless Shelter  In-House Referral:  Clinical Social Work  Discharge planning Services  CM Consult  Post Acute Care Choice:    Choice offered to:     DME Arranged:    DME Agency:     HH Arranged:    HH Agency:     Status of Service:  In process, will continue to follow  Medicare Important Message Given:    Date Medicare IM Given:    Medicare IM give by:    Date Additional Medicare IM Given:    Additional Medicare Important Message give by:     If discussed at Long Length of Stay Meetings, dates discussed:    Additional Comments:  Cheryl FlashBlackwell, Rosilyn Coachman Crowder, RN 09/03/2014, 1:52 PM

## 2014-09-03 NOTE — Progress Notes (Signed)
Pts ace wrap saturated in bright red blood. Notified Dr. Lovell SheehanJenkins. Instructed to place 4x4s and new ace wrap and reinforce staying in bed with foot elevated.
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
## 2014-09-03 NOTE — Progress Notes (Signed)
Hypoglycemic Event  CBG: 56  Treatment: One AMP of D50  Symptoms: Patient had no signs or complaints that blood sugar was low.  Follow-up CBG: Time:0859 CBG Result:94  Possible Reasons for Event: Patient is unsure of why blood sugar dropped this AM.  Comments/MD notified: Dr. Doroteo BradfordMemon    James, Sharmon Leydenaylor Morgan  Remember to initiate Hypoglycemia Order Set & complete

## 2014-09-04 ENCOUNTER — Encounter (HOSPITAL_COMMUNITY): Payer: Self-pay | Admitting: General Surgery

## 2014-09-04 LAB — CBC
HCT: 28.3 % — ABNORMAL LOW (ref 39.0–52.0)
HEMOGLOBIN: 9.3 g/dL — AB (ref 13.0–17.0)
MCH: 31.3 pg (ref 26.0–34.0)
MCHC: 32.9 g/dL (ref 30.0–36.0)
MCV: 95.3 fL (ref 78.0–100.0)
Platelets: 197 10*3/uL (ref 150–400)
RBC: 2.97 MIL/uL — ABNORMAL LOW (ref 4.22–5.81)
RDW: 12.6 % (ref 11.5–15.5)
WBC: 8.6 10*3/uL (ref 4.0–10.5)

## 2014-09-04 LAB — BASIC METABOLIC PANEL
ANION GAP: 4 — AB (ref 5–15)
BUN: 12 mg/dL (ref 6–20)
CALCIUM: 7.8 mg/dL — AB (ref 8.9–10.3)
CO2: 26 mmol/L (ref 22–32)
Chloride: 106 mmol/L (ref 101–111)
Creatinine, Ser: 1.06 mg/dL (ref 0.61–1.24)
GFR calc non Af Amer: >60 mL/min (ref >60)
Glucose, Bld: 241 mg/dL — ABNORMAL HIGH (ref 70–99)
Potassium: 4 mmol/L (ref 3.5–5.1)
SODIUM: 136 mmol/L (ref 135–145)

## 2014-09-04 LAB — GLUCOSE, CAPILLARY
GLUCOSE-CAPILLARY: 277 mg/dL — AB (ref 70–99)
Glucose-Capillary: 225 mg/dL — ABNORMAL HIGH (ref 70–99)
Glucose-Capillary: 79 mg/dL (ref 70–99)
Glucose-Capillary: 161 mg/dL — ABNORMAL HIGH (ref 70–99)

## 2014-09-04 MED ORDER — DOXYCYCLINE HYCLATE 100 MG PO TABS
100.0000 mg | ORAL_TABLET | Freq: Two times a day (BID) | ORAL | Status: DC
  Administered 2014-09-04 – 2014-09-05 (×3): 100 mg via ORAL
  Filled 2014-09-04 (×3): qty 1

## 2014-09-04 MED ORDER — INSULIN GLARGINE 100 UNIT/ML ~~LOC~~ SOLN
5.0000 [IU] | Freq: Every day | SUBCUTANEOUS | Status: DC
  Administered 2014-09-04: 5 [IU] via SUBCUTANEOUS
  Filled 2014-09-04 (×2): qty 0.05

## 2014-09-04 MED ORDER — VANCOMYCIN 50 MG/ML ORAL SOLUTION
125.0000 mg | Freq: Four times a day (QID) | ORAL | Status: DC
  Administered 2014-09-04 – 2014-09-05 (×5): 125 mg via ORAL
  Filled 2014-09-04 (×13): qty 2.5

## 2014-09-04 MED ORDER — INSULIN ASPART 100 UNIT/ML ~~LOC~~ SOLN
3.0000 [IU] | Freq: Three times a day (TID) | SUBCUTANEOUS | Status: DC
  Administered 2014-09-05 (×2): 3 [IU] via SUBCUTANEOUS

## 2014-09-04 NOTE — Patient Instructions (Signed)
Bridge   Lie back, legs bent. Inhale, pressing hips up. Keeping ribs in, lengthen lower back. Exhale, rolling down along spine from top. Repeat 10 times. Do 3 sessions per day.  Copyright  VHI. All rights reserved.   Chair Push-Up   Lift buttocks off seat of chair by pushing down with arms. Repeat 10 times. Do 3 sessions per day.  http://gt2.exer.us/439   Copyright  VHI. All rights reserved.   ANKLE: Pumps   Point toes down, then up. 20 reps per set, 3 sets per day, 7 days per week   Copyright  VHI. All rights reserved.

## 2014-09-04 NOTE — Progress Notes (Addendum)
Inpatient Diabetes Program Recommendations  AACE/ADA: New Consensus Statement on Inpatient Glycemic Control (2013)  Target Ranges:  Prepandial:   less than 140 mg/dL      Peak postprandial:   less than 180 mg/dL (1-2 hours)      Critically ill patients:  140 - 180 mg/dL   Results for Tyler Burgess, Tyler Burgess (MRN [id number]) as of 09/04/2014 07:28  Ref. Range 09/03/2014 08:59 09/03/2014 11:56 09/03/2014 12:24 09/03/2014 14:22 09/03/2014 16:34 09/03/2014 17:59 09/03/2014 20:40 09/03/2014 23:42  Glucose-Capillary Latest Ref Range: 70-99 mg/dL 94 54 (L) 782133 (H) 92 69 (L) 192 (H) 295 (H) 297 (H)   Diabetes history: DM2 Outpatient Diabetes medications: Lantus 24 units QHS, Humalog 8 units BID (with lunch and supper) Current orders for Inpatient glycemic control: Novolog 0-9 units TID with meals  Inpatient Diabetes Program Recommendations Insulin - Basal: Fasting glucose 241 mg/dl this morning. Please consider ordering lower dose of Lantus than ordered originally. Please consider ordering Lantus 5 units Q24H starting now. Correction (SSI): Please consider ordering Novolog bedtime correction scale. Insulin-correction: Please consider ordering Novolog 3 units TID with meals for meal coverage.  09/04/14@3 :45-Spoke with patient about diabetes and home regimen for diabetes control. Patient reports that [facility] and currently he takes Lantus 24 units QHS and Humalog 8 units BID (with lunch and supper) as an outpatient for diabetes control.  Patient reports that he checks his glucose 4 times a day and his glucose is usually in the 70's mg/dl or less in the morning and it increases throughout the day up to 300's mg/dl. Patient states that he does not take any Humalog with breakfast because his glucose is already low. Patient reports that when he was last hospitalized he was receiving Lantus 20 units QHS and woke up with low blood sugars almost daily.  Discussed A1C results (12.7% on 08/31/14) and  explained how A1C correlates to actual glucose values. Discussed basic pathophysiology of DM, importance of checking CBGs and maintaining good CBG control to prevent long-term and short-term complications. Discussed impact of nutrition, exercise, stress, sickness, and medications on diabetes control.  Patient states that he is having pain in his foot despite pain medication and feels this is contributing to his glucose being elevated. Encouraged patient to let nursing staff and MD know if he was continuing to have pain despite taking pain medication. Inquired about last time insulin changes were [facility] and patient states that he does not even remember when any insulin changes had been made it had been so long. Encouraged patient to continue to check his glucose 4 times a day and to take his glucose readings with him to the doctor so that additional adjustments could be made with diabetes medications if necessary.  Patient verbalized understanding of information discussed and he states that he has no further questions at this time related to diabetes.   Thanks, Orlando PennerMarie Jameek Bruntz, RN, MSN, CCRN, CDE Diabetes Coordinator Inpatient Diabetes Program [phone] (Team Pager from 8am to 5pm) [phone] ([facility] office) [phone] [facility] office)
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
## 2014-09-04 NOTE — Progress Notes (Signed)
Pt removed ACE bandage, 4x4, and dressing. Reinforced with kurlex and ACE wrap.

## 2014-09-04 NOTE — Progress Notes (Signed)
1 Day Post-Op  Subjective: No incisional pain. Has been ambulating despite being told to stay in bed.  Objective: Vital signs in last 24 hours: Temp:  [97 F (36.1 C)-98.3 F (36.8 C)] 98.2 F (36.8 C) (05/10 0540) Pulse Rate:  [50-70] 54 (05/10 0540) Resp:  [12-20] 18 (05/10 0540) BP: (94-153)/(54-79) 128/77 mmHg (05/10 0540) SpO2:  [95 %-99 %] 97 % (05/10 0540) Weight:  [85.73 kg (189 lb)] 85.73 kg (189 lb) (05/09 1243) Last BM Date: 09/03/14  Intake/Output from previous day: 05/09 0701 - 05/10 0700 In: 2272.5 [P.O.:480; I.V.:1492.5; IV Piggyback:300] Out: 525 [Urine:500; Blood:25] Intake/Output this shift:    General appearance: alert, cooperative and no distress Extremities: Right foot bandage dry and intact after being replaced yesterday evening.  Lab Results:   Recent Labs  09/02/14 0626 09/03/14 0704  WBC 14.6* 12.4*  HGB 9.9* 10.6*  HCT 29.3* 31.8*  PLT 231 235   BMET  Recent Labs  09/02/14 0626 09/03/14 0704  NA 136 138  K 3.4* 4.1  CL 105 106  CO2 25 27  GLUCOSE 45* 60*  BUN 14 13  CREATININE 1.05 1.17  CALCIUM 7.8* 8.4*   PT/INR No results for input(s): LABPROT, INR in the last 72 hours.  Studies/Results: No results found.  Anti-infectives: Anti-infectives    Start     Dose/Rate Route Frequency Ordered Stop   09/01/14 1400  metroNIDAZOLE (FLAGYL) tablet 500 mg     500 mg Oral 3 times per day 09/01/14 1008 09/15/14 1359   08/31/14 2100  vancomycin (VANCOCIN) IVPB 1000 mg/200 mL premix     1,000 mg 200 mL/hr over 60 Minutes Intravenous Every 12 hours 08/31/14 1343     08/31/14 1600  piperacillin-tazobactam (ZOSYN) IVPB 3.375 g  Status:  Discontinued     3.375 g 12.5 mL/hr over 240 Minutes Intravenous Every 8 hours 08/31/14 1343 09/03/14 1657   08/31/14 0830  piperacillin-tazobactam (ZOSYN) IVPB 3.375 g     3.375 g 12.5 mL/hr over 240 Minutes Intravenous  Once 08/31/14 0824 08/31/14 1216   08/31/14 0830  vancomycin (VANCOCIN) IVPB 1000  mg/200 mL premix     1,000 mg 200 mL/hr over 60 Minutes Intravenous  Once 08/31/14 0824 08/31/14 1107      Assessment/Plan: s/p Procedure(s): AMPUTATION RIGHT GREAT TOE AND SECOND TOE Impression: Stable postoperatively. We'll get PT consult for nonweightbearing ambulation. We will take dressing down in a.m.  LOS: 4 days    Almeta Geisel A 09/04/2014

## 2014-09-04 NOTE — Care Management Note (Signed)
Case Management Note  Patient Details  Name: Tyler LoganBilly Strawder MRN: [id number] Date of Birth: 04/13/1956  Subjective/Objective:                    Action/Plan:   Expected Discharge Date:                  Expected Discharge Plan:  Homeless Shelter  In-House Referral:  Clinical Social Work  Discharge planning Services  CM Consult  Post Acute Care Choice:    Choice offered to:     DME Arranged:    DME Agency:     HH Arranged:    HH Agency:     Status of Service:  Completed, signed off  Medicare Important Message Given:    Date Medicare IM Given:    Medicare IM give by:    Date Additional Medicare IM Given:    Additional Medicare Important Message give by:     If discussed at Long Length of Stay Meetings, dates discussed:    Additional Comments: CM spoke with pt about discharge planning and any discharge needs. Pt will [place] homeless shelter [place] but is unable to recall name of shelter. Pt stated that it has been worked out and they will accept pt at discharge. Pt also stated that [facility] and they give him his insulin. Pt stated that [facility] and he will need to follow up with Dr. Lovell SheehanJenkins post op as well. PT to evaluate pt for any discharge needs. Will continue to follow. Arlyss QueenBlackwell, Nayeli Calvert [place]der, RN 09/04/2014, 10:48 AM

## 2014-09-04 NOTE — Anesthesia Postprocedure Evaluation (Signed)
  Anesthesia Post-op Note  Patient: Tyler Burgess  Procedure(s) Performed: Procedure(s) with comments: AMPUTATION RIGHT GREAT TOE AND SECOND TOE (Right) - right great and second toes  Patient Location: PACU  Anesthesia Type:General  Level of Consciousness: awake, alert  and oriented  Airway and Oxygen Therapy: Patient Spontanous Breathing  Post-op Pain: none  Post-op Assessment: Post-op Vital signs reviewed, Patient's Cardiovascular Status Stable, Respiratory Function Stable, Patent Airway, No signs of Nausea or vomiting and Adequate PO intake  Post-op Vital Signs: Reviewed and stable  Last Vitals:  Filed Vitals:   09/04/14 1011  BP: 101/58  Pulse: 60  Temp: 36.6 C  Resp: 18    Complications: No apparent anesthesia complications

## 2014-09-04 NOTE — Addendum Note (Signed)
Addendum  created 09/04/14 1057 by Moshe SalisburyKaren E Letonia Stead, CRNA   Modules edited: Notes Section   Notes Section:  File: [id number]
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
## 2014-09-05 DIAGNOSIS — A047 Enterocolitis due to Clostridium difficile: Secondary | ICD-10-CM

## 2014-09-05 DIAGNOSIS — A419 Sepsis, unspecified organism: Principal | ICD-10-CM

## 2014-09-05 DIAGNOSIS — E1140 Type 2 diabetes mellitus with diabetic neuropathy, unspecified: Secondary | ICD-10-CM

## 2014-09-05 DIAGNOSIS — M869 Osteomyelitis, unspecified: Secondary | ICD-10-CM

## 2014-09-05 DIAGNOSIS — A0472 Enterocolitis due to Clostridium difficile, not specified as recurrent: Secondary | ICD-10-CM

## 2014-09-05 LAB — BASIC METABOLIC PANEL
Anion gap: 3 — ABNORMAL LOW (ref 5–15)
BUN: 10 mg/dL (ref 6–20)
CALCIUM: 8.3 mg/dL — AB (ref 8.9–10.3)
CO2: 28 mmol/L (ref 22–32)
CREATININE: 1.19 mg/dL (ref 0.61–1.24)
Chloride: 108 mmol/L (ref 101–111)
GFR calc Af Amer: >60 mL/min (ref >60)
GFR calc non Af Amer: >60 mL/min (ref >60)
GLUCOSE: 144 mg/dL — AB (ref 70–99)
Potassium: 4.5 mmol/L (ref 3.5–5.1)
SODIUM: 139 mmol/L (ref 135–145)

## 2014-09-05 LAB — CULTURE, BLOOD (ROUTINE X 2)
CULTURE: NO GROWTH
Culture: NO GROWTH

## 2014-09-05 LAB — CBC
HCT: 31.4 % — ABNORMAL LOW (ref 39.0–52.0)
HEMOGLOBIN: 10.2 g/dL — AB (ref 13.0–17.0)
MCH: 31.4 pg (ref 26.0–34.0)
MCHC: 32.5 g/dL (ref 30.0–36.0)
MCV: 96.6 fL (ref 78.0–100.0)
PLATELETS: 236 10*3/uL (ref 150–400)
RBC: 3.25 MIL/uL — ABNORMAL LOW (ref 4.22–5.81)
RDW: 12.8 % (ref 11.5–15.5)
WBC: 8.3 10*3/uL (ref 4.0–10.5)

## 2014-09-05 LAB — GLUCOSE, CAPILLARY
GLUCOSE-CAPILLARY: 134 mg/dL — AB (ref 70–99)
Glucose-Capillary: 188 mg/dL — ABNORMAL HIGH (ref 70–99)
Glucose-Capillary: 68 mg/dL — ABNORMAL LOW (ref 70–99)
Glucose-Capillary: 91 mg/dL (ref 70–99)

## 2014-09-05 MED ORDER — VANCOMYCIN 50 MG/ML ORAL SOLUTION: ORAL | Status: DC

## 2014-09-05 MED ORDER — DOXYCYCLINE HYCLATE 100 MG PO TABS: 100.0000 mg | ORAL_TABLET | Freq: Two times a day (BID) | ORAL | Status: DC

## 2014-09-05 MED ORDER — SACCHAROMYCES BOULARDII 250 MG PO CAPS: 250.0000 mg | ORAL_CAPSULE | Freq: Two times a day (BID) | ORAL | Status: DC

## 2014-09-05 MED ORDER — OXYCODONE HCL 10 MG PO TABS: 10.0000 mg | ORAL_TABLET | Freq: Four times a day (QID) | ORAL | Status: DC | PRN

## 2014-09-05 MED ORDER — INSULIN GLARGINE 100 UNIT/ML ~~LOC~~ SOLN: 5.0000 [IU] | Freq: Every day | SUBCUTANEOUS | Status: DC

## 2014-09-05 MED ORDER — INSULIN ASPART 100 UNIT/ML ~~LOC~~ SOLN: 3.0000 [IU] | Freq: Three times a day (TID) | SUBCUTANEOUS | Status: DC

## 2014-09-05 NOTE — Progress Notes (Signed)
Right foot incision healing well. Okay for discharge from surgery standpoint. Have discussed antibiotic coverage with the hospitalist. Patient has been ordered and orthopedic sandal. I will see the patient in 2 weeks for follow-up.

## 2014-09-05 NOTE — Clinical Social Work Note (Signed)
CSW followed up with pt again this morning. He states that he did not call the homeless shelters yesterday, but will do so before leaving. He confirmed plan to stay in his truck if he cannot find an open shelter. Pt states getting to follow up appointments will not be a problem. CSW will sign off.  Derenda FennelKara Lacrisha Bielicki, LCSW [phone]

## 2014-09-05 NOTE — Progress Notes (Signed)
D/C instructions, paperwork and hard Rx given to patient prior to to d/c this shift. IV catheter removed from LEFT wrist/hand region, catheter tip intact, no s/s of infection noted. Drsg to RIGHT foot intact and wrapped in ACE bandage. Patient's brother at bedside to pick up patient.

## 2014-09-05 NOTE — Progress Notes (Signed)
Hypoglycemic Event  CBG: 68 Treatment: 15 GM carbohydrate snack  Symptoms: None  Follow-up CBG: Time:1802 CBG Result:91  Possible Reasons for Event: Unknown  Comments/MD notified:Yes    Tyler Burgess L  Remember to initiate Hypoglycemia Order Set & complete
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
## 2014-09-05 NOTE — Progress Notes (Signed)
Checked with patient several times throughout the shift regarding having a ride from hospital d/t being d/c today and patient stated "I'm still waiting on my brother to get here to pick me up."

## 2014-09-05 NOTE — Care Management Note (Signed)
Case Management Note  Patient Details  Name: Tyler LoganBilly Burgess MRN: [id number] Date of Birth: 11/18/1955  Subjective/Objective:                    Action/Plan:   Expected Discharge Date:                  Expected Discharge Plan:  Homeless Shelter  In-House Referral:  Clinical Social Work  Discharge planning Services  CM Consult  Post Acute Care Choice:  Durable Medical Equipment Choice offered to:  Patient  DME Arranged:  Government social research officerWheelchair manual DME Agency:     HH Arranged:    HH Agency:     Status of Service:  Completed, signed off  Medicare Important Message Given:    Date Medicare IM Given:    Medicare IM give by:    Date Additional Medicare IM Given:    Additional Medicare Important Message give by:     If discussed at Long Length of Stay Meetings, dates discussed:    Additional Comments: Pt discharged home today. Pt is [place] homeless shelters in [place] and if he cannot get into one he stated he will return to living in his truck. CSW has been working with pt as well. [facility]as [facility] has [facility] and will be delivered to pt room prior to discharge.  Pt given MATCH voucher for assistance with AB at discharge. Pt is aware that voucher will not cover any pain medications. Pts bedside RN to give extra dressing supplies for pt to take with him. Pt has follow up with DR. Lovell SheehanJenkins. [facility] and they will continue to provide pts insulin to him. No other CM needs noted. Pt and pts nurse aware of discharge arrangements. Arlyss QueenBlackwell, Danyle Boening Caneyrowder, RN 09/05/2014, 11:44 AM

## 2014-09-05 NOTE — Progress Notes (Signed)
We received a request from Dr. Lovell SheehanJenkins to provide the pt with a post op shoe.  I have called [employer] and have asked them to send one up.  His nurse will place it on his foot.

## 2014-09-12 LAB — HIV ANTIBODY (ROUTINE TESTING W REFLEX)

## 2014-09-15 ENCOUNTER — Other Ambulatory Visit (HOSPITAL_COMMUNITY): Payer: Self-pay

## 2014-09-15 ENCOUNTER — Encounter (HOSPITAL_COMMUNITY): Payer: Self-pay | Admitting: *Deleted

## 2014-09-15 ENCOUNTER — Inpatient Hospital Stay (HOSPITAL_COMMUNITY)
Admission: EM | Admit: 2014-09-15 | Discharge: 2014-09-26 | DRG: 857 | Disposition: A | Discharge status: Skilled Nursing Facility | Payer: Medicaid Other | Attending: Internal Medicine | Admitting: Internal Medicine

## 2014-09-15 ENCOUNTER — Ambulatory Visit (HOSPITAL_COMMUNITY)
Admission: RE | Admit: 2014-09-15 | Discharge: 2014-09-15 | Disposition: A | Discharge status: Home / Self Care | Payer: Medicaid Other | Attending: Psychiatry | Admitting: Psychiatry

## 2014-09-15 ENCOUNTER — Inpatient Hospital Stay (HOSPITAL_COMMUNITY): Payer: Medicaid Other

## 2014-09-15 DIAGNOSIS — F17210 Nicotine dependence, cigarettes, uncomplicated: Secondary | ICD-10-CM | POA: Insufficient documentation

## 2014-09-15 DIAGNOSIS — T814XXA Infection following a procedure, initial encounter: Principal | ICD-10-CM | POA: Diagnosis present

## 2014-09-15 DIAGNOSIS — E1165 Type 2 diabetes mellitus with hyperglycemia: Secondary | ICD-10-CM | POA: Diagnosis present

## 2014-09-15 DIAGNOSIS — E870 Hyperosmolality and hypernatremia: Secondary | ICD-10-CM | POA: Diagnosis present

## 2014-09-15 DIAGNOSIS — R45850 Homicidal ideations: Secondary | ICD-10-CM | POA: Diagnosis present

## 2014-09-15 DIAGNOSIS — Z833 Family history of diabetes mellitus: Secondary | ICD-10-CM

## 2014-09-15 DIAGNOSIS — Z9114 Patient's other noncompliance with medication regimen: Secondary | ICD-10-CM | POA: Diagnosis present

## 2014-09-15 DIAGNOSIS — F411 Generalized anxiety disorder: Secondary | ICD-10-CM | POA: Diagnosis present

## 2014-09-15 DIAGNOSIS — F329 Major depressive disorder, single episode, unspecified: Secondary | ICD-10-CM | POA: Diagnosis not present

## 2014-09-15 DIAGNOSIS — Z79891 Long term (current) use of opiate analgesic: Secondary | ICD-10-CM | POA: Diagnosis not present

## 2014-09-15 DIAGNOSIS — E1142 Type 2 diabetes mellitus with diabetic polyneuropathy: Secondary | ICD-10-CM | POA: Diagnosis present

## 2014-09-15 DIAGNOSIS — E119 Type 2 diabetes mellitus without complications: Secondary | ICD-10-CM | POA: Diagnosis not present

## 2014-09-15 DIAGNOSIS — E871 Hypo-osmolality and hyponatremia: Secondary | ICD-10-CM | POA: Diagnosis present

## 2014-09-15 DIAGNOSIS — R509 Fever, unspecified: Secondary | ICD-10-CM

## 2014-09-15 DIAGNOSIS — Z79899 Other long term (current) drug therapy: Secondary | ICD-10-CM

## 2014-09-15 DIAGNOSIS — Z794 Long term (current) use of insulin: Secondary | ICD-10-CM | POA: Diagnosis not present

## 2014-09-15 DIAGNOSIS — N183 Chronic kidney disease, stage 3 (moderate): Secondary | ICD-10-CM | POA: Diagnosis present

## 2014-09-15 DIAGNOSIS — M21331 Wrist drop, right wrist: Secondary | ICD-10-CM | POA: Diagnosis present

## 2014-09-15 DIAGNOSIS — R45851 Suicidal ideations: Secondary | ICD-10-CM | POA: Diagnosis present

## 2014-09-15 DIAGNOSIS — Z590 Homelessness: Secondary | ICD-10-CM

## 2014-09-15 DIAGNOSIS — R29810 Facial weakness: Secondary | ICD-10-CM | POA: Diagnosis not present

## 2014-09-15 DIAGNOSIS — F333 Major depressive disorder, recurrent, severe with psychotic symptoms: Secondary | ICD-10-CM | POA: Diagnosis present

## 2014-09-15 DIAGNOSIS — F332 Major depressive disorder, recurrent severe without psychotic features: Secondary | ICD-10-CM | POA: Diagnosis present

## 2014-09-15 DIAGNOSIS — R197 Diarrhea, unspecified: Secondary | ICD-10-CM | POA: Diagnosis not present

## 2014-09-15 DIAGNOSIS — D631 Anemia in chronic kidney disease: Secondary | ICD-10-CM | POA: Diagnosis present

## 2014-09-15 DIAGNOSIS — M869 Osteomyelitis, unspecified: Secondary | ICD-10-CM | POA: Diagnosis not present

## 2014-09-15 DIAGNOSIS — N179 Acute kidney failure, unspecified: Secondary | ICD-10-CM | POA: Diagnosis present

## 2014-09-15 DIAGNOSIS — R531 Weakness: Secondary | ICD-10-CM | POA: Diagnosis not present

## 2014-09-15 DIAGNOSIS — L03115 Cellulitis of right lower limb: Secondary | ICD-10-CM | POA: Diagnosis present

## 2014-09-15 DIAGNOSIS — E1310 Other specified diabetes mellitus with ketoacidosis without coma: Secondary | ICD-10-CM | POA: Diagnosis not present

## 2014-09-15 DIAGNOSIS — E0810 Diabetes mellitus due to underlying condition with ketoacidosis without coma: Secondary | ICD-10-CM | POA: Diagnosis not present

## 2014-09-15 DIAGNOSIS — I639 Cerebral infarction, unspecified: Secondary | ICD-10-CM

## 2014-09-15 DIAGNOSIS — E1110 Type 2 diabetes mellitus with ketoacidosis without coma: Secondary | ICD-10-CM | POA: Insufficient documentation

## 2014-09-15 DIAGNOSIS — M868X7 Other osteomyelitis, ankle and foot: Secondary | ICD-10-CM | POA: Diagnosis present

## 2014-09-15 DIAGNOSIS — M6289 Other specified disorders of muscle: Secondary | ICD-10-CM | POA: Diagnosis not present

## 2014-09-15 DIAGNOSIS — I129 Hypertensive chronic kidney disease with stage 1 through stage 4 chronic kidney disease, or unspecified chronic kidney disease: Secondary | ICD-10-CM | POA: Diagnosis present

## 2014-09-15 DIAGNOSIS — R441 Visual hallucinations: Secondary | ICD-10-CM | POA: Diagnosis present

## 2014-09-15 LAB — URINE MICROSCOPIC-ADD ON

## 2014-09-15 LAB — COMPREHENSIVE METABOLIC PANEL
ALBUMIN: 3.3 g/dL — AB (ref 3.5–5.0)
ALT: 51 U/L (ref 17–63)
ANION GAP: 16 — AB (ref 5–15)
AST: 96 U/L — AB (ref 15–41)
Alkaline Phosphatase: 212 U/L — ABNORMAL HIGH (ref 38–126)
BILIRUBIN TOTAL: 0.3 mg/dL (ref 0.3–1.2)
BUN: 14 mg/dL (ref 6–20)
CO2: 20 mmol/L — ABNORMAL LOW (ref 22–32)
CREATININE: 1.51 mg/dL — AB (ref 0.61–1.24)
Calcium: 7.9 mg/dL — ABNORMAL LOW (ref 8.9–10.3)
Chloride: 90 mmol/L — ABNORMAL LOW (ref 101–111)
GFR calc Af Amer: 57 mL/min — ABNORMAL LOW (ref >60)
GFR calc non Af Amer: 50 mL/min — ABNORMAL LOW (ref >60)
Glucose, Bld: 1016 mg/dL (ref 65–99)
Potassium: 4.2 mmol/L (ref 3.5–5.1)
Sodium: 126 mmol/L — ABNORMAL LOW (ref 135–145)
TOTAL PROTEIN: 7.2 g/dL (ref 6.5–8.1)

## 2014-09-15 LAB — RAPID URINE DRUG SCREEN, HOSP PERFORMED
Amphetamines: NOT DETECTED
BARBITURATES: NOT DETECTED
BENZODIAZEPINES: NOT DETECTED
Cocaine: NOT DETECTED
Opiates: NOT DETECTED
TETRAHYDROCANNABINOL: NOT DETECTED

## 2014-09-15 LAB — CBC
HEMATOCRIT: 37.2 % — AB (ref 39.0–52.0)
HEMATOCRIT: 27.8 % — AB (ref 39.0–52.0)
Hemoglobin: 11.3 g/dL — ABNORMAL LOW (ref 13.0–17.0)
Hemoglobin: 9.5 g/dL — ABNORMAL LOW (ref 13.0–17.0)
MCH: 31.2 pg (ref 26.0–34.0)
MCH: 31.5 pg (ref 26.0–34.0)
MCHC: 30.4 g/dL (ref 30.0–36.0)
MCHC: 34.2 g/dL (ref 30.0–36.0)
MCV: 102.8 fL — AB (ref 78.0–100.0)
MCV: 92.1 fL (ref 78.0–100.0)
Platelets: 257 10*3/uL (ref 150–400)
Platelets: 214 10*3/uL (ref 150–400)
RBC: 3.62 MIL/uL — ABNORMAL LOW (ref 4.22–5.81)
RBC: 3.02 MIL/uL — ABNORMAL LOW (ref 4.22–5.81)
RDW: 13.3 % (ref 11.5–15.5)
RDW: 12.9 % (ref 11.5–15.5)
WBC: 11.1 10*3/uL — ABNORMAL HIGH (ref 4.0–10.5)
WBC: 11.2 10*3/uL — AB (ref 4.0–10.5)

## 2014-09-15 LAB — URINALYSIS, ROUTINE W REFLEX MICROSCOPIC
Bilirubin Urine: NEGATIVE
HGB URINE DIPSTICK: NEGATIVE
Ketones, ur: NEGATIVE mg/dL
Leukocytes, UA: NEGATIVE
Nitrite: NEGATIVE
PROTEIN: NEGATIVE mg/dL
Specific Gravity, Urine: 1.029 (ref 1.005–1.030)
Urobilinogen, UA: 0.2 mg/dL (ref 0.0–1.0)
pH: 6 (ref 5.0–8.0)

## 2014-09-15 LAB — BASIC METABOLIC PANEL
ANION GAP: 7 (ref 5–15)
BUN: 13 mg/dL (ref 6–20)
CO2: 26 mmol/L (ref 22–32)
CREATININE: 0.99 mg/dL (ref 0.61–1.24)
Calcium: 7.6 mg/dL — ABNORMAL LOW (ref 8.9–10.3)
Chloride: 104 mmol/L (ref 101–111)
GFR calc non Af Amer: >60 mL/min (ref >60)
GLUCOSE: 161 mg/dL — AB (ref 65–99)
Potassium: 3.2 mmol/L — ABNORMAL LOW (ref 3.5–5.1)
SODIUM: 137 mmol/L (ref 135–145)

## 2014-09-15 LAB — CBG MONITORING, ED: Glucose-Capillary: >600 mg/dL (ref 65–99)

## 2014-09-15 LAB — ETHANOL: Alcohol, Ethyl (B): 57 mg/dL — ABNORMAL HIGH (ref <5)

## 2014-09-15 LAB — SALICYLATE LEVEL: Salicylate Lvl: <4 mg/dL (ref 2.8–30.0)

## 2014-09-15 LAB — ACETAMINOPHEN LEVEL: Acetaminophen (Tylenol), Serum: <10 ug/mL — ABNORMAL LOW (ref 10–30)

## 2014-09-15 LAB — GLUCOSE, CAPILLARY
GLUCOSE-CAPILLARY: 126 mg/dL — AB (ref 65–99)
GLUCOSE-CAPILLARY: 96 mg/dL (ref 65–99)
Glucose-Capillary: 294 mg/dL — ABNORMAL HIGH (ref 65–99)
Glucose-Capillary: 77 mg/dL (ref 65–99)

## 2014-09-15 LAB — TROPONIN I

## 2014-09-15 MED ORDER — SODIUM CHLORIDE 0.9 % IV SOLN
INTRAVENOUS | Status: DC
  Administered 2014-09-15: 5.4 [IU]/h via INTRAVENOUS
  Filled 2014-09-15: qty 2.5

## 2014-09-15 MED ORDER — INSULIN REGULAR BOLUS VIA INFUSION
0.0000 [IU] | Freq: Three times a day (TID) | INTRAVENOUS | Status: DC
  Filled 2014-09-15: qty 10

## 2014-09-15 MED ORDER — ONDANSETRON HCL 4 MG PO TABS: 4.0000 mg | ORAL_TABLET | Freq: Four times a day (QID) | ORAL | Status: DC | PRN

## 2014-09-15 MED ORDER — INSULIN GLARGINE 100 UNIT/ML ~~LOC~~ SOLN
10.0000 [IU] | Freq: Once | SUBCUTANEOUS | Status: AC
  Administered 2014-09-16: 10 [IU] via SUBCUTANEOUS
  Filled 2014-09-15: qty 0.1

## 2014-09-15 MED ORDER — ENOXAPARIN SODIUM 40 MG/0.4ML ~~LOC~~ SOLN
40.0000 mg | SUBCUTANEOUS | Status: DC
  Administered 2014-09-15 – 2014-09-25 (×11): 40 mg via SUBCUTANEOUS
  Filled 2014-09-15 (×11): qty 0.4

## 2014-09-15 MED ORDER — SODIUM CHLORIDE 0.9 % IV SOLN
1000.0000 mL | Freq: Once | INTRAVENOUS | Status: AC
  Administered 2014-09-15: 1000 mL via INTRAVENOUS

## 2014-09-15 MED ORDER — DEXTROSE 50 % IV SOLN
25.0000 mL | INTRAVENOUS | Status: DC | PRN
  Administered 2014-09-25: 25 mL via INTRAVENOUS
  Filled 2014-09-15: qty 50

## 2014-09-15 MED ORDER — ONDANSETRON HCL 4 MG/2ML IJ SOLN: 4.0000 mg | Freq: Four times a day (QID) | INTRAMUSCULAR | Status: DC | PRN

## 2014-09-15 MED ORDER — BENZTROPINE MESYLATE 0.5 MG PO TABS
0.5000 mg | ORAL_TABLET | Freq: Two times a day (BID) | ORAL | Status: DC
  Administered 2014-09-15 – 2014-09-26 (×22): 0.5 mg via ORAL
  Filled 2014-09-15 (×22): qty 1

## 2014-09-15 MED ORDER — SODIUM CHLORIDE 0.9 % IV SOLN: 1000.0000 mL | INTRAVENOUS | Status: DC

## 2014-09-15 MED ORDER — SODIUM CHLORIDE 0.9 % IV BOLUS (SEPSIS)
1000.0000 mL | Freq: Once | INTRAVENOUS | Status: AC
  Administered 2014-09-15: 1000 mL via INTRAVENOUS

## 2014-09-15 MED ORDER — VANCOMYCIN 50 MG/ML ORAL SOLUTION
125.0000 mg | Freq: Four times a day (QID) | ORAL | Status: DC
  Administered 2014-09-16 – 2014-09-22 (×25): 125 mg via ORAL
  Filled 2014-09-15 (×30): qty 2.5

## 2014-09-15 MED ORDER — ACETAMINOPHEN 325 MG PO TABS
650.0000 mg | ORAL_TABLET | Freq: Once | ORAL | Status: AC
  Administered 2014-09-15: 650 mg via ORAL
  Filled 2014-09-15: qty 2

## 2014-09-15 MED ORDER — DEXTROSE-NACL 5-0.45 % IV SOLN: INTRAVENOUS | Status: DC

## 2014-09-15 MED ORDER — INSULIN GLARGINE 100 UNIT/ML ~~LOC~~ SOLN
20.0000 [IU] | Freq: Every day | SUBCUTANEOUS | Status: DC
  Administered 2014-09-16 – 2014-09-21 (×6): 20 [IU] via SUBCUTANEOUS
  Filled 2014-09-15 (×6): qty 0.2

## 2014-09-15 MED ORDER — OXYCODONE HCL 5 MG PO TABS
10.0000 mg | ORAL_TABLET | Freq: Four times a day (QID) | ORAL | Status: DC | PRN
  Administered 2014-09-15 – 2014-09-16 (×4): 10 mg via ORAL
  Filled 2014-09-15 (×4): qty 2

## 2014-09-15 MED ORDER — SODIUM CHLORIDE 0.9 % IJ SOLN
3.0000 mL | Freq: Two times a day (BID) | INTRAMUSCULAR | Status: DC
  Administered 2014-09-16 – 2014-09-21 (×10): 3 mL via INTRAVENOUS

## 2014-09-15 MED ORDER — DOXYCYCLINE HYCLATE 100 MG PO TABS
100.0000 mg | ORAL_TABLET | Freq: Two times a day (BID) | ORAL | Status: DC
  Administered 2014-09-15 – 2014-09-16 (×2): 100 mg via ORAL
  Filled 2014-09-15 (×3): qty 1

## 2014-09-15 MED ORDER — SODIUM CHLORIDE 0.9 % IV SOLN
INTRAVENOUS | Status: DC
  Administered 2014-09-15 – 2014-09-16 (×2): via INTRAVENOUS

## 2014-09-15 MED ORDER — SODIUM CHLORIDE 0.9 % IV SOLN
INTRAVENOUS | Status: DC
  Filled 2014-09-15: qty 2.5

## 2014-09-15 MED ORDER — INSULIN ASPART 100 UNIT/ML ~~LOC~~ SOLN
0.0000 [IU] | SUBCUTANEOUS | Status: DC
  Administered 2014-09-16 (×2): 4 [IU] via SUBCUTANEOUS
  Administered 2014-09-16: 8 [IU] via SUBCUTANEOUS

## 2014-09-15 MED ORDER — SACCHAROMYCES BOULARDII 250 MG PO CAPS
250.0000 mg | ORAL_CAPSULE | Freq: Two times a day (BID) | ORAL | Status: DC
  Administered 2014-09-15 – 2014-09-26 (×22): 250 mg via ORAL
  Filled 2014-09-15 (×22): qty 1

## 2014-09-15 MED ORDER — ACETAMINOPHEN 325 MG PO TABS: 650.0000 mg | ORAL_TABLET | Freq: Four times a day (QID) | ORAL | Status: DC | PRN

## 2014-09-15 MED ORDER — ACETAMINOPHEN 650 MG RE SUPP: 650.0000 mg | Freq: Four times a day (QID) | RECTAL | Status: DC | PRN

## 2014-09-15 NOTE — ED Provider Notes (Signed)
CSN: [id number]     Arrival date & time 09/15/14  1554 History   First MD Initiated Contact with Patient 09/15/14 1647     Chief Complaint  Patient presents with  . Medical Clearance  . Suicidal  . Homicidal   Tyler Burgess is a 59 y.o. male with a history of diabetes and depression who presents to the ED complaining of suicidal and homicidal ideations. He tells me he has several people he wants to "take out before I go." He states he would shoot himself with a gun. He denies any access to gun currently. He reports homicidal ideations towards a Conservator, museum/gallery and a Technical sales engineer. He complains of being paranoid that people are watching him in the grocery store. He endorses visual hallucinations of Jesus who tells him that he is "the chosen one." The patient complains of 8 out of 10 right foot pain after he had amputation of toes #1, 2 and 3 on 09/03/2014. Patient had right toe amputations done by Dr. Franky Macho. He is due to have follow-up with him on 09/20/2014 for stitches removal. He reports his right lower leg and foot have been swollen since before his surgery to remove his toes.  He reports having insulin at home but does not keep it refrigerated because he has no means of refrigeration. He reports drinking vodka last night but has not had any alcohol to drink today. He denies any ingestion of illicit substances today. The patient denies fevers, chills, chest pain, shortness of breath, nausea, vomiting, abdominal pain, or rashes.   (Consider location/radiation/quality/duration/timing/severity/associated sxs/prior Treatment) HPI  Past Medical History  Diagnosis Date  . Diabetes mellitus without complication   . Depression    Past Surgical History  Procedure Laterality Date  . Right arm    . Cholecystectomy    . Amputation toe Right 09/03/2014    Procedure: AMPUTATION RIGHT GREAT TOE AND SECOND TOE;  Surgeon: Franky Macho Md, MD;  Location: [facility];  Service:  General;  Laterality: Right;  right great and second toes   Family History  Problem Relation Age of Onset  . Diabetes Mother   . Depression Father   . Depression Brother   . Diabetes Other   . Depression Other    History  Substance Use Topics  . Smoking status: Current Every Day Smoker -- 0.50 packs/day for 35 years    Types: Cigarettes  . Smokeless tobacco: Not on file  . Alcohol Use: 1.2 oz/week    2 Cans of beer per week     Comment: once per month    Review of Systems  Constitutional: Negative for fever and chills.  HENT: Negative for congestion and sore throat.   Eyes: Negative for pain and visual disturbance.  Respiratory: Negative for cough, shortness of breath and wheezing.   Cardiovascular: Positive for leg swelling. Negative for chest pain and palpitations.  Gastrointestinal: Negative for nausea, vomiting and abdominal pain.  Genitourinary: Negative for dysuria and difficulty urinating.  Musculoskeletal: Negative for back pain and neck pain.       Right foot pain.   Skin: Positive for wound. Negative for rash.  Neurological: Negative for weakness, light-headedness, numbness and headaches.  Psychiatric/Behavioral: Positive for suicidal ideas, hallucinations and dysphoric mood.      Allergies  Review of patient's allergies indicates no known allergies.  Home Medications   Prior to Admission medications   Medication Sig Start Date End Date Taking? Authorizing Provider  insulin aspart (NOVOLOG) 100 UNIT/ML injection Inject 3 Units into the skin 3 (three) times daily with meals. Patient taking differently: Inject 10 Units into the skin 3 (three) times daily with meals.  09/05/14  Yes Jeralyn Bennett, MD  insulin glargine (LANTUS) 100 UNIT/ML injection Inject 0.05 mLs (5 Units total) into the skin at bedtime. Patient taking differently: Inject 24 Units into the skin at bedtime.  09/05/14  Yes Jeralyn Bennett, MD  benztropine (COGENTIN) 0.5 MG tablet Take 1 tablet  (0.5 mg total) by mouth 2 (two) times daily. Patient not taking: Reported on 09/15/2014 06/19/14   Gerhard Munch, MD  doxycycline (VIBRA-TABS) 100 MG tablet Take 1 tablet (100 mg total) by mouth every 12 (twelve) hours. Patient not taking: Reported on 09/15/2014 09/05/14   Jeralyn Bennett, MD  hydrOXYzine (ATARAX/VISTARIL) 50 MG tablet Take 1 tablet (50 mg total) by mouth 3 (three) times daily. Patient not taking: Reported on 09/15/2014 06/19/14   Gerhard Munch, MD  Multiple Vitamin (MULTIVITAMIN WITH MINERALS) TABS tablet Take 1 tablet by mouth daily. Patient not taking: Reported on 09/15/2014 04/18/14   Thermon Leyland, NP  oxyCODONE 10 MG TABS Take 1 tablet (10 mg total) by mouth every 6 (six) hours as needed for moderate pain or severe pain. Patient not taking: Reported on 09/15/2014 09/05/14   Jeralyn Bennett, MD  saccharomyces boulardii (FLORASTOR) 250 MG capsule Take 1 capsule (250 mg total) by mouth 2 (two) times daily. Patient not taking: Reported on 09/15/2014 09/05/14   Jeralyn Bennett, MD  vancomycin (VANCOCIN) 50 mg/mL oral solution Take 125 mg PO QID for 14 days QTY sufficient for 14 days Patient not taking: Reported on 09/15/2014 09/05/14   Jeralyn Bennett, MD   BP 160/86 mmHg  Pulse 80  Temp(Src) 98.6 F (37 C) (Oral)  Resp 18  SpO2 100% Physical Exam  Constitutional: He is oriented to person, place, and time. He appears well-developed and well-nourished. No distress.  Nontoxic appearing.  HENT:  Head: Normocephalic and atraumatic.  Mouth/Throat: Oropharynx is clear and moist.  Eyes: Conjunctivae are normal. Pupils are equal, round, and reactive to light. Right eye exhibits no discharge. Left eye exhibits no discharge.  Neck: Neck supple.  Cardiovascular: Normal rate, regular rhythm, normal heart sounds and intact distal pulses.  Exam reveals no gallop and no friction rub.   No murmur heard. Bilateral radial, posterior tibialis and dorsalis pedis pulses are intact.  Right DP and  PT pulses are auscultated by doppler. HR is 96.   Pulmonary/Chest: Effort normal and breath sounds normal. No respiratory distress. He has no wheezes. He has no rales.  Abdominal: Soft. He exhibits no distension. There is no tenderness. There is no guarding.  Musculoskeletal: Normal range of motion. He exhibits edema and tenderness.  Right lower leg edema and larger than left leg. Right toes #1,2, 3 amputated with sutures still in place. Small amount of clear discharge noted with overlying erythema, no streaking erythema. Skin is soft and good capillary refill of toes #4 & 5.   Lymphadenopathy:    He has no cervical adenopathy.  Neurological: He is alert and oriented to person, place, and time. Coordination normal.  Skin: Skin is warm and dry. No rash noted. He is not diaphoretic. No pallor.  Psychiatric: His behavior is normal. His speech is not rapid and/or pressured, not delayed and not slurred. Thought content is paranoid. He exhibits a depressed mood. He expresses homicidal and suicidal ideation. He expresses suicidal plans and homicidal plans.  He is communicative.  Endorses SI and HI with plan to shoot himself and specific others. Endorses paranoia. Endorses thought insertion.   Nursing note and vitals reviewed.   ED Course  Procedures (including critical care time) Labs Review Labs Reviewed  ACETAMINOPHEN LEVEL - Abnormal; Notable for the following:    Acetaminophen (Tylenol), Serum <10 (*)    All other components within normal limits  CBC - Abnormal; Notable for the following:    WBC 11.1 (*)    RBC 3.62 (*)    Hemoglobin 11.3 (*)    HCT 37.2 (*)    MCV 102.8 (*)    All other components within normal limits  COMPREHENSIVE METABOLIC PANEL - Abnormal; Notable for the following:    Sodium 126 (*)    Chloride 90 (*)    CO2 20 (*)    Glucose, Bld 1016 (*)    Creatinine, Ser 1.51 (*)    Calcium 7.9 (*)    Albumin 3.3 (*)    AST 96 (*)    Alkaline Phosphatase 212 (*)    GFR  calc non Af Amer 50 (*)    GFR calc Af Amer 57 (*)    Anion gap 16 (*)    All other components within normal limits  ETHANOL - Abnormal; Notable for the following:    Alcohol, Ethyl (B) 57 (*)    All other components within normal limits  URINALYSIS, ROUTINE W REFLEX MICROSCOPIC - Abnormal; Notable for the following:    Glucose, UA >1000 (*)    All other components within normal limits  CBG MONITORING, ED - Abnormal; Notable for the following:    Glucose-Capillary >600 (*)    All other components within normal limits  CBG MONITORING, ED - Abnormal; Notable for the following:    Glucose-Capillary >600 (*)    All other components within normal limits  SALICYLATE LEVEL  URINE RAPID DRUG SCREEN (HOSP PERFORMED)  URINE MICROSCOPIC-ADD ON    Imaging Review No results found.   EKG Interpretation None    Please see EKG interpretation in muse.   Filed Vitals:   09/15/14 1803 09/15/14 1830 09/15/14 1845 09/15/14 1948  BP: 167/93 175/94 152/81 160/86  Pulse: 71 83 75 80  Temp:   98.4 F (36.9 C) 98.6 F (37 C)  TempSrc:    Oral  Resp: SpO2: 99% 100% 100% 100%     MDM   Meds given in ED:  Medications  insulin regular (NOVOLIN R,HUMULIN R) 250 Units in sodium chloride 0.9 % 250 mL (1 Units/mL) infusion (10.8 Units/hr Intravenous Rate/Dose Change 09/15/14 1849)  0.9 %  sodium chloride infusion (0 mLs Intravenous Stopped 09/15/14 1848)    Followed by  0.9 %  sodium chloride infusion (1,000 mLs Intravenous New Bag/Given 09/15/14 1745)    Followed by  0.9 %  sodium chloride infusion (0 mLs Intravenous Stopped 09/15/14 1913)    Followed by  0.9 %  sodium chloride infusion (not administered)  dextrose 5 %-0.45 % sodium chloride infusion (not administered)  sodium chloride 0.9 % bolus 1,000 mL (0 mLs Intravenous Stopped 09/15/14 1848)  acetaminophen (TYLENOL) tablet 650 mg (650 mg Oral Given 09/15/14 1827)    Current Discharge Medication List      Final diagnoses:   Diabetic ketoacidosis without coma associated with type 2 diabetes mellitus  Suicidal ideations  Homicidal ideations   This is a 59 y.o. male with a history of diabetes and depression who presents  to the ED complaining of suicidal and homicidal ideations. He tells me he has several people he wants to "take out before I go." He states he would shoot himself with a gun.  He also reports last using his insulin yesterday and does not refrigerate it.  On exam patient is afebrile and nontoxic appearing. The patient has amputated toes #1, 2, 3. There is overlying erythema and a small amount of clear discharge. His abdomen is soft and nontender to palpation. He is alert and oriented 3. His urine drug screen is negative. His urinalysis is negative for infection. Is a negative acetaminophen and salicylate level. His alcohol level is 57. His CMP indicated a blood sugar of 1016 with an anion gap of 16. Four liter fluid bolus initiated and glucose stabilizer. Will consult for admission. The patient is voluntary and in agreement with admission. He is cooperative with me. Dr. Beatrice Lecheroutovia accepted the patient for admission.   This patient was discussed with and evaluated by Dr. Freida BusmanAllen who agrees with assessment and plan.     Everlene FarrierWilliam Mahlet Jergens, PA-C 09/15/14 2000  Lorre NickAnthony Allen, MD 09/18/14 [phone]

## 2014-09-15 NOTE — ED Provider Notes (Addendum)
Medical screening examination/treatment/procedure(s) were conducted as a shared visit with non-physician practitioner(s) and myself.  I personally evaluated the patient during the encounter.   EKG Interpretation None          Rate: 68   Rhythm: normal sinus rhythm  QRS Axis: normal  Intervals: normal  ST/T Wave abnormalities: normal  Conduction Disutrbances:none  Narrative Interpretation:   Old EKG Reviewed: none available Lorre NickAnthony Charlean Carneal, MD 09/15/14 1826  Lorre NickAnthony Ruslan Mccabe, MD 09/15/14 [phone]

## 2014-09-15 NOTE — ED Notes (Signed)
Hospitalist at bedside 

## 2014-09-15 NOTE — ED Notes (Signed)
Bed: [id number] Expected date:  Expected time:  Means of arrival:  Comments: Triage 2, Lambert ModySharp

## 2014-09-15 NOTE — ED Notes (Signed)
[facility]us 3&4 [facility]

## 2014-09-15 NOTE — ED Notes (Signed)
Pt reports auditory/visual hallucinations of Jesus; but denies at present time. Flight of ideas with hallucinations per pt.

## 2014-09-15 NOTE — H&P (Signed)
PCP:  No PCP Per Patient    Referring provider Will PA   Chief Complaint: Suicidal ideation  HPI: Tyler Burgess is a 59 y.o. male   has a past medical history of Diabetes mellitus without complication and Depression.   Presented with hallucinations as well as suicidal or homicidal towards some people in [place]. As well as some people at [facility] and  behavioral health. Patient was supine on going ahead with this plan with his preoperative talk to moderate and he presented to behavioral health. Behavioral Health he was noted to have elevated blood glucose was sent to his [facility] Patient is homeless and lives on his stroke. Was known to say from going to dime taking them visually. Per records patient has extensive psychiatric history with suicide attempt since age of 9 recently got out of jail after too [place] to facility and was threatening the staff. Patient is homeless type II diabetic. Has insulin that not able to refrigerate. At some point his stated that he was actually out of his Lantus and NovoLog. On arrival to emerge department but glucose 1016 noted to have creatinine 1.5 PCO2 of 20 and anion gap of 16. Patient is being admitted to medicine service for mild DKA. Patient reports not able to afford his medications. States he just want to die to get it over with. He has been out of his medications about 3 months.  He reports having diarrhea for the past few months he has 3-4 BM a day no blood in stool. Her records it seems that he has been treated in the past for C. difficile colitis but was not able to take his medications Had a transmetatarsal amputation in [place] in the setting of osteomyelitis. Patient reports he burned his feet by accident and was not able to get  Medical attention.   Hospitalist was called for admission for mild DKA  Review of Systems:    Pertinent positives include: suicidal and homicidal ideations. Seeing Jesus, depression or  anxiety.  Constitutional:  No weight loss, night sweats, Fevers, chills, fatigue, weight loss  HEENT:  No headaches, Difficulty swallowing,Tooth/dental problems,Sore throat,  No sneezing, itching, ear ache, nasal congestion, post nasal drip,  Cardio-vascular:  No chest pain, Orthopnea, PND, anasarca, dizziness, palpitations.no Bilateral lower extremity swelling  GI:  No heartburn, indigestion, abdominal pain, nausea, vomiting, diarrhea, change in bowel habits, loss of appetite, melena, blood in stool, hematemesis Resp:  no shortness of breath at rest. No dyspnea on exertion, No excess mucus, no productive cough, No non-productive cough, No coughing up of blood.No change in color of mucus.No wheezing. Skin:  no rash or lesions. No jaundice GU:  no dysuria, change in color of urine, no urgency or frequency. No straining to urinate.  No flank pain.  Musculoskeletal:  No joint pain or no joint swelling. No decreased range of motion. No back pain.  Psych:  No change in mood or affect. No  No memory loss.  Neuro: no localizing neurological complaints, no tingling, no weakness, no double vision, no gait abnormality, no slurred speech, no confusion  Otherwise ROS are negative except for above, 10 systems were reviewed  Past Medical History: Past Medical History  Diagnosis Date  . Diabetes mellitus without complication   . Depression    Past Surgical History  Procedure Laterality Date  . Right arm    . Cholecystectomy    . Amputation toe Right 09/03/2014    Procedure: AMPUTATION RIGHT GREAT  TOE AND SECOND TOE;  Surgeon: Franky Macho Md, MD;  Location: [facility];  Service: General;  Laterality: Right;  right great and second toes     Medications: Prior to Admission medications   Medication Sig Start Date End Date Taking? Authorizing Provider  insulin aspart (NOVOLOG) 100 UNIT/ML injection Inject 3 Units into the skin 3 (three) times daily with meals. Patient taking differently: Inject  10 Units into the skin 3 (three) times daily with meals.  09/05/14  Yes Jeralyn Bennett, MD  insulin glargine (LANTUS) 100 UNIT/ML injection Inject 0.05 mLs (5 Units total) into the skin at bedtime. Patient taking differently: Inject 24 Units into the skin at bedtime.  09/05/14  Yes Jeralyn Bennett, MD  benztropine (COGENTIN) 0.5 MG tablet Take 1 tablet (0.5 mg total) by mouth 2 (two) times daily. Patient not taking: Reported on 09/15/2014 06/19/14   Gerhard Munch, MD  doxycycline (VIBRA-TABS) 100 MG tablet Take 1 tablet (100 mg total) by mouth every 12 (twelve) hours. Patient not taking: Reported on 09/15/2014 09/05/14   Jeralyn Bennett, MD  hydrOXYzine (ATARAX/VISTARIL) 50 MG tablet Take 1 tablet (50 mg total) by mouth 3 (three) times daily. Patient not taking: Reported on 09/15/2014 06/19/14   Gerhard Munch, MD  Multiple Vitamin (MULTIVITAMIN WITH MINERALS) TABS tablet Take 1 tablet by mouth daily. Patient not taking: Reported on 09/15/2014 04/18/14   Thermon Leyland, NP  oxyCODONE 10 MG TABS Take 1 tablet (10 mg total) by mouth every 6 (six) hours as needed for moderate pain or severe pain. Patient not taking: Reported on 09/15/2014 09/05/14   Jeralyn Bennett, MD  saccharomyces boulardii (FLORASTOR) 250 MG capsule Take 1 capsule (250 mg total) by mouth 2 (two) times daily. Patient not taking: Reported on 09/15/2014 09/05/14   Jeralyn Bennett, MD  vancomycin (VANCOCIN) 50 mg/mL oral solution Take 125 mg PO QID for 14 days QTY sufficient for 14 days Patient not taking: Reported on 09/15/2014 09/05/14   Jeralyn Bennett, MD    Allergies:  No Known Allergies  Social History:  Ambulatory   independently  But states needs a walker or cane  Homeless     reports that he has been smoking Cigarettes.  He has a 17.5 pack-year smoking history. He does not have any smokeless tobacco history on file. He reports that he drinks about 1.2 oz of alcohol per week. He reports that he does not use illicit drugs.     Family History: family history includes Depression in his brother, father, and other; Diabetes in his mother and other.    Physical Exam: Patient Vitals for the past 24 hrs:  BP Temp Temp src Pulse Resp SpO2  09/15/14 1830 175/94 mmHg - - 83 22 100 %  09/15/14 1803 167/93 mmHg - - 71 18 99 %  09/15/14 1626 153/79 mmHg 97.6 F (36.4 C) Oral 102 16 100 %    1. General:  in No Acute distress 2. Psychological: Alert and   Oriented 3. Head/ENT:    Dry Mucous Membranes                          Head Non traumatic, neck supple                            Poor Dentition 4. SKIN:   decreased Skin turgor,  Skin clean Dry and intact no rash 5. Heart: Regular rate and rhythm  no Murmur, Rub or gallop 6. Lungs: Clear to auscultation bilaterally, no wheezes or crackles   7. Abdomen: Soft, non-tender, Non distended 8. Lower extremities: no clubbing, cyanosis, some swelling of right foot serosanguineous drainage from the site of prior amputation no warmth noted 9. Neurologically Grossly intact, moving all 4 extremities equally 10. MSK: Normal range of motion  body mass index is unknown because there is no weight on file.   Labs on Admission:   Results for orders placed or performed during the hospital encounter of 09/15/14 (from the past 24 hour(s))  POC CBG, ED     Status: Abnormal   Collection Time: 09/15/14  4:42 PM  Result Value Ref Range   Glucose-Capillary >600 (HH) 65 - 99 mg/dL   Comment 1 Notify RN    Comment 2 Document in Chart   Acetaminophen level     Status: Abnormal   Collection Time: 09/15/14  4:47 PM  Result Value Ref Range   Acetaminophen (Tylenol), Serum <10 (L) 10 - 30 ug/mL  CBC     Status: Abnormal   Collection Time: 09/15/14  4:47 PM  Result Value Ref Range   WBC 11.1 (H) 4.0 - 10.5 K/uL   RBC 3.62 (L) 4.22 - 5.81 MIL/uL   Hemoglobin 11.3 (L) 13.0 - 17.0 g/dL   HCT 16.1 (L) 09.6 - 04.5 %   MCV 102.8 (H) 78.0 - 100.0 fL   MCH 31.2 26.0 - 34.0 pg   MCHC 30.4  30.0 - 36.0 g/dL   RDW 40.9 81.1 - 91.4 %   Platelets 257 150 - 400 K/uL  Comprehensive metabolic panel     Status: Abnormal   Collection Time: 09/15/14  4:47 PM  Result Value Ref Range   Sodium 126 (L) 135 - 145 mmol/L   Potassium 4.2 3.5 - 5.1 mmol/L   Chloride 90 (L) 101 - 111 mmol/L   CO2 20 (L) 22 - 32 mmol/L   Glucose, Bld 1016 (HH) 65 - 99 mg/dL   BUN 14 6 - 20 mg/dL   Creatinine, Ser 7.82 (H) 0.61 - 1.24 mg/dL   Calcium 7.9 (L) 8.9 - 10.3 mg/dL   Total Protein 7.2 6.5 - 8.1 g/dL   Albumin 3.3 (L) 3.5 - 5.0 g/dL   AST 96 (H) 15 - 41 U/L   ALT 51 17 - 63 U/L   Alkaline Phosphatase 212 (H) 38 - 126 U/L   Total Bilirubin 0.3 0.3 - 1.2 mg/dL   GFR calc non Af Amer 50 (L) >60 mL/min   GFR calc Af Amer 57 (L) >60 mL/min   Anion gap 16 (H) 5 - 15  Ethanol (ETOH)     Status: Abnormal   Collection Time: 09/15/14  4:47 PM  Result Value Ref Range   Alcohol, Ethyl (B) 57 (H) <5 mg/dL  Salicylate level     Status: None   Collection Time: 09/15/14  4:47 PM  Result Value Ref Range   Salicylate Lvl <4.0 2.8 - 30.0 mg/dL  Urine Drug Screen     Status: None   Collection Time: 09/15/14  4:50 PM  Result Value Ref Range   Opiates NONE DETECTED NONE DETECTED   Cocaine NONE DETECTED NONE DETECTED   Benzodiazepines NONE DETECTED NONE DETECTED   Amphetamines NONE DETECTED NONE DETECTED   Tetrahydrocannabinol NONE DETECTED NONE DETECTED   Barbiturates NONE DETECTED NONE DETECTED  Urinalysis, Routine w reflex microscopic     Status: Abnormal   Collection Time: 09/15/14  4:50 PM  Result Value Ref Range   Color, Urine YELLOW YELLOW   APPearance CLEAR CLEAR   Specific Gravity, Urine 1.029 1.005 - 1.030   pH 6.0 5.0 - 8.0   Glucose, UA >1000 (A) NEGATIVE mg/dL   Hgb urine dipstick NEGATIVE NEGATIVE   Bilirubin Urine NEGATIVE NEGATIVE   Ketones, ur NEGATIVE NEGATIVE mg/dL   Protein, ur NEGATIVE NEGATIVE mg/dL   Urobilinogen, UA 0.2 0.0 - 1.0 mg/dL   Nitrite NEGATIVE NEGATIVE    Leukocytes, UA NEGATIVE NEGATIVE  Urine microscopic-add on     Status: None   Collection Time: 09/15/14  4:50 PM  Result Value Ref Range   Squamous Epithelial / LPF RARE RARE   WBC, UA 0-2 <3 WBC/hpf   RBC / HPF 3-6 <3 RBC/hpf   Bacteria, UA RARE RARE   Urine-Other RARE YEAST   CBG monitoring, ED     Status: Abnormal   Collection Time: 09/15/14  6:32 PM  Result Value Ref Range   Glucose-Capillary >600 (HH) 65 - 99 mg/dL    UA glucose >4098  Lab Results  Component Value Date   HGBA1C 12.7* 08/31/2014    Estimated Creatinine Clearance: 55.7 mL/min (by C-G formula based on Cr of 1.51).  BNP (last 3 results) No results for input(s): PROBNP in the last 8760 hours.  Other results:  I have pearsonaly reviewed this: ECG REPORT  Rate: 77  Rhythm: Sinus rhythm ST&T Change: No ischemic changes QTc 468  There were no vitals filed for this visit.   Cultures:    Component Value Date/Time   SDES URINE, RANDOM 08/31/2014 0905   SPECREQUEST NONE 08/31/2014 0905   CULT NO GROWTH Performed at [facility]  08/31/2014 0905   REPTSTATUS 09/02/2014 FINAL 08/31/2014 0905     Radiological Exams on Admission: No results found.  Chart has been reviewed  Family  not at  Bedside    Assessment/Plan  59 year old gentleman with prior history of significant diabetes poorly controlled utilizing listhesis. As well as psychiatric disorder ventricular technicians for suicidal and homicidal ideation presents with homicidal ideation but it was found to have mild DKA. Had recent amputation of the right forefoot in the setting of osteomyelitis. He has not been compliant with antibiotics. Recent treatment for C. difficile also has not been compliant of recurrence of diarrhea  Present on Admission:  . DKA, type 2, not at goal  - continue glucose stabilizes frequent blood work, transition back to Lantus once gap is closed, rehydrate, social work consult was patient will need to have  helped with his medications  . Type 2 diabetes mellitus with peripheral neuropathy - poor compliance secondary to homeless should've social work consult ordered  . Osteomyelitis - she has not been compliant with his medications will obtain plain emergent foot to reevaluate for possible recurrent osteomyelitis. Restart doxycycline. Patient will likely benefit from orthopedics consult in the morning  . Hyponatremia - partially corrects likely explained by hyperglycemia and dehydration  Diarrhea suspect C. difficile will restart by mouth vancomycin   suicidal or homicidal ideations. Restart home medications psychiatric consult in the morning   Prophylaxis:   Lovenox   CODE STATUS:   DNR/DNI as per patient while suicidal has to be overturned to FULL CODE  Disposition:   likely will need placement for rehabilitation if able, will need social work consult  Other plan as per orders.  I have spent a total of 55 min on this admission  Tyler Burgess 09/15/2014, 6:43 PM  [facility]  Pager [phone]   after 2 AM please page floor coverage PA If 7AM-7PM, please contact the day team taking care of the patient  [email or web address]  Password [email or web address]

## 2014-09-15 NOTE — ED Notes (Signed)
3rd bolus complete to L arm. At present time, 4th bolus running to L AC and insulin running to L arm.

## 2014-09-15 NOTE — ED Notes (Signed)
[facility]. Pt reports SI, HI. Sts he is homicidal towards about 5 specific people, some in [place], [facility] and [facility], a Child psychotherapistsocial worker from a previous encounter, doesn't remember her name but would recognize her. Also HI towards his aunt; sts that everyone else lied to him and told him that they were going to help him because he is tired of being homeless and depressed but yet he still has been for over a year. Sts he has a plan to shoot them and then cut himself to kill himself. "If I'm going to die, I'm taking them with me." Sts he sought help for these thoughts because he is afraid if he goes through with killing them, he will get caught, go to prison and "not able to finish the job" which he clarifies to mean killing himself. Sts he has a Hx of suicide attempts since he was age 59, by means of cutting wrists, OD, carbon monoxide poisoning, and hanging. [facility], he recently got out of jail for taking a BB gun to [facility] [place], threatening people. She also stated that he reported to her that he is "the chosen one from God and God is telling him to do this." Pt alert and oriented in triage, conversive, originally not wanting to divulge his plans for harming others for fear of "being stopped like last time," but did eventually begin to speak of plans. Pt voluntary at this time. Pt denied any symptoms of hyper- or hypoglycemia. He reports he has been out of his Lantus and Novolog for a few days. CBG reading in triage "HI" above 600.

## 2014-09-15 NOTE — BH Assessment (Signed)
Assessment Note  Tyler Burgess is an 59 y.o. male that presented to [facility] this day as a walk-in.  Pt is voluntary and self-referred with his brother present.  Pt stated that he is both suicidal and homicidal.  He stated he was not going to reveal his plans.  He stated he was going to kill the social workers and nurses that lied to him in the hospital and in jail.  Pt was in jail for 30 days for bringing a BB gun into [facility] in [place], threatening staff.  Pt stated he has not been getting his right medications.  Pt stated he wants to "kill those people" and then kill himself.  He stated he sees Jesus every morning and he is the "chosen one" and "Jesus wants me to kill the people, too."  Pt stated he is tired of not having financial help, being homeless and living in his truck, and not being able to afford his medications.  Pt stated he does take his Insulin for Diabetes.  Pt stated he is "tired of being lied to" and that is why he had to get 2 of his toes cut off at the hospital recently.  Pt stated that he knows the rest of his body is going to fail, so he doesn't want to live anymore.  Pt is currently on probation.  He stated he did not follow up with outpatient services as recommended when last seen at [facility] 05/2014 because he "nobody will see me in [place] and I can't afford the medications."  Pt denies SA, but has hx of alcohol and drug abuse.  Pt stated he drinks 2 beers once per week.  Pt irritable, disheveled, with body odor, has irritable/depressed affect, appears anxious, has pressured speech, is oriented x 3, has coherent thought processes.  Inpatient treatment is recommended for the pt at this time, as he is a threat to himself and others.  Consulted with Serena Colonel, NP, who recommends pt be sent to [facility] for medical clearance and TTS to seek placement for the pt.  Updated ED and TTS staff.  Spoke with ED charge nurse and pt transported via [place] to [facility] for med clearance, as pt is voluntary.  Axis  I: 296.34 Major Depressive Disorder, Recurrent, Severe With Psychosis Axis II: Deferred Axis III:  Past Medical History  Diagnosis Date  . Diabetes mellitus without complication   . Depression    Axis IV: economic problems, housing problems, occupational problems, other psychosocial or environmental problems, problems related to legal system/crime and problems with primary support group Axis V: 21-30 behavior considerably influenced by delusions or hallucinations OR serious impairment in judgment, communication OR inability to function in almost all areas  Past Medical History:  Past Medical History  Diagnosis Date  . Diabetes mellitus without complication   . Depression     Past Surgical History  Procedure Laterality Date  . Right arm    . Cholecystectomy    . Amputation toe Right 09/03/2014    Procedure: AMPUTATION RIGHT GREAT TOE AND SECOND TOE;  Surgeon: Franky Macho Md, MD;  Location: [facility];  Service: General;  Laterality: Right;  right great and second toes    Family History:  Family History  Problem Relation Age of Onset  . Diabetes Mother   . Depression Father   . Depression Brother   . Diabetes Other   . Depression Other     Social History:  reports that he has been smoking Cigarettes.  He  has a 17.5 pack-year smoking history. He does not have any smokeless tobacco history on file. He reports that he drinks about 1.2 oz of alcohol per week. He reports that he does not use illicit drugs.  Additional Social History:  Alcohol / Drug Use Pain Medications: see med list Prescriptions: see med list Over the Counter: see med list History of alcohol / drug use?: Yes Longest period of sobriety (when/how long): unknown Negative Consequences of Use:  (na-pt denies) Withdrawal Symptoms:  (na-pt denies) Substance #1 Name of Substance 1: Alcohol 1 - Age of First Use: unknown 1 - Amount (size/oz): 2 beers 1 - Frequency: once per week 1 - Duration: ongoing 1 - Last Use /  Amount: 2 weeks ago  CIWA:   COWS:    Allergies: No Known Allergies  Home Medications:  (Not in a hospital admission)  OB/GYN Status:  No LMP for male patient.  General Assessment Data Location of Assessment: [facility] Assessment Services TTS Assessment: In system Is this a Tele or Face-to-Face Assessment?: Face-to-Face Is this an Initial Assessment or a Re-assessment for this encounter?: Initial Assessment Marital status: Single Maiden name:  (na) Is patient pregnant?: Other (Comment) (na) Pregnancy Status: Other (Comment) (na) Living Arrangements: Other (Comment) (Homeless) Can pt return to current living arrangement?: Yes Admission Status: Voluntary Is patient capable of signing voluntary admission?: Yes Referral Source: Self/Family/Friend Insurance type: Medicaid  Medical Screening Exam [facility] Walk-in ONLY) Medical Exam completed: No Reason for MSE not completed: Other: (pt sent to [facility] for med clearance)  Crisis Care Plan Living Arrangements: Other (Comment) (Homeless) Name of Psychiatrist: none Name of Therapist: none  Education Status Is patient currently in school?: No  Risk to self with the past 6 months Suicidal Ideation: Yes-Currently Present Has patient been a risk to self within the past 6 months prior to admission? : Yes Suicidal Intent: Yes-Currently Present Has patient had any suicidal intent within the past 6 months prior to admission? : Yes Is patient at risk for suicide?: Yes Suicidal Plan?: Yes-Currently Present Has patient had any suicidal plan within the past 6 months prior to admission? : Yes Specify Current Suicidal Plan: pt stated he was not going to tell his plan Access to Means:  (unknown) What has been your use of drugs/alcohol within the last 12 months?: pt reports using alcohol once per week Previous Attempts/Gestures: No How many times?: 0 (Has had SI in past) Other Self Harm Risks: na - pt denies Triggers for Past Attempts: None  known Intentional Self Injurious Behavior: None Family Suicide History: No Recent stressful life event(s): Financial Problems, Legal Issues, Recent negative physical changes, Other (Comment) (SI/HI with plans, off of Bipolar medications, homeless) Persecutory voices/beliefs?: No Depression: Yes Depression Symptoms: Despondent, Insomnia, Isolating, Fatigue, Loss of interest in usual pleasures, Feeling worthless/self pity, Feeling angry/irritable Substance abuse history and/or treatment for substance abuse?: No Suicide prevention information given to non-admitted patients: Not applicable  Risk to Others within the past 6 months Homicidal Ideation: Yes-Currently Present Does patient have any lifetime risk of violence toward others beyond the six months prior to admission? : Yes (comment) Thoughts of Harm to Others: Yes-Currently Present Comment - Thoughts of Harm to Others: stated he wanted to kill a Child psychotherapist and nurse in [place]  Current Homicidal Intent: Yes-Currently Present Current Homicidal Plan: Yes-Currently Present Describe Current Homicidal Plan: pt stated he would not tell his plan Access to Homicidal Means:  (unknown) Identified Victim: Child psychotherapist in [place], Engineer, civil (consulting) in jail  History of harm to others?: No Assessment of Violence: In past 6-12 months Violent Behavior Description: [facility] and [facility] staff [facility] Does patient have access to weapons?: No Criminal Charges Pending?: No Does patient have a court date: No Is patient on probation?: Yes  Psychosis Hallucinations: Auditory, Visual, With command (Reports Jesus talks to him) Delusions: Grandiose (He thinks he is the chosen one by [employer], but he has to kill)  Mental Status Report Appearance/Hygiene: Disheveled, Body odor, Poor hygiene Eye Contact: Good Motor Activity: Freedom of movement, Unremarkable Speech: Logical/coherent, Pressured Level of Consciousness: Alert, Irritable Mood:  Depressed, Irritable Affect: Depressed, Irritable Anxiety Level: Panic Attacks Panic attack frequency: "every time I go into a store" Most recent panic attack: 2 days ago Thought Processes: Coherent, Relevant Judgement: Impaired Orientation: Person, Place, Situation Obsessive Compulsive Thoughts/Behaviors: None  Cognitive Functioning Concentration: Decreased Memory: Recent Intact, Remote Intact IQ: Average Insight: Poor Impulse Control: Poor Appetite: Poor Weight Loss: 10 Weight Gain: 0 Sleep: No Change Total Hours of Sleep:  (reports not sleeping) Vegetative Symptoms: Not bathing, Decreased grooming  ADLScreening [facility] Assessment Services) Patient's cognitive ability adequate to safely complete daily activities?: Yes Patient able to express need for assistance with ADLs?: Yes Independently performs ADLs?: Yes (appropriate for developmental age)  Prior Inpatient Therapy Prior Inpatient Therapy: Yes Prior Therapy Dates: 2015 Prior Therapy Facilty/Provider(s): [facility] Reason for Treatment: HI  Prior Outpatient Therapy Prior Outpatient Therapy: Yes Prior Therapy Dates: 2015 [facility]ilty[facility] Reason for Treatment: med mgnt Does patient have an ACCT team?: No Does patient have Intensive In-House Services?  : No Does patient have [facility] services? : No Does patient have P4CC services?: No  ADL Screening (condition at time of admission) Patient's cognitive ability adequate to safely complete daily activities?: Yes Is the patient deaf or have difficulty hearing?: No Does the patient have difficulty seeing, even when wearing glasses/contacts?: No Does the patient have difficulty concentrating, remembering, or making decisions?: No Patient able to express need for assistance with ADLs?: Yes Does the patient have difficulty dressing or bathing?: No Independently performs ADLs?: Yes (appropriate for developmental age) Does the patient have  difficulty walking or climbing stairs?: No  Home Assistive Devices/Equipment Home Assistive Devices/Equipment: CBG Meter, Dentures (specify type), Eyeglasses    Abuse/Neglect Assessment (Assessment to be complete while patient is alone) Physical Abuse: Denies Verbal Abuse: Denies Sexual Abuse: Denies Exploitation of patient/patient's resources: Denies Self-Neglect: Denies Values / Beliefs Cultural Requests During Hospitalization: None Spiritual Requests During Hospitalization: None Consults Spiritual Care Consult Needed: No Social Work Consult Needed: No Merchant navy officerAdvance Directives (For Healthcare) Does patient have an advance directive?: No Would patient like information on creating an advanced directive?: No - patient declined information    Additional Information 1:1 In Past 12 Months?: No CIRT Risk: No Elopement Risk: No Does patient have medical clearance?: No     Disposition:  Disposition Initial Assessment Completed for this Encounter: Yes Disposition of Patient: Referred to, Inpatient treatment program Type of inpatient treatment program: Adult Patient referred to: Other (Comment) ([facility] per Serena ColonelAggie Nwoko, NP)  On Site Evaluation by:   Reviewed with Physician:    Casimer LaniusKristen Lenya Sterne, MS, [facility] Therapeutic Triage Specialist [facility]   09/15/2014 3:47 PM

## 2014-09-16 DIAGNOSIS — R45851 Suicidal ideations: Secondary | ICD-10-CM

## 2014-09-16 DIAGNOSIS — R45850 Homicidal ideations: Secondary | ICD-10-CM

## 2014-09-16 DIAGNOSIS — E0810 Diabetes mellitus due to underlying condition with ketoacidosis without coma: Secondary | ICD-10-CM

## 2014-09-16 DIAGNOSIS — F329 Major depressive disorder, single episode, unspecified: Secondary | ICD-10-CM

## 2014-09-16 LAB — BASIC METABOLIC PANEL
ANION GAP: 7 (ref 5–15)
Anion gap: 6 (ref 5–15)
BUN: 12 mg/dL (ref 6–20)
BUN: 11 mg/dL (ref 6–20)
CALCIUM: 7.5 mg/dL — AB (ref 8.9–10.3)
CALCIUM: 7.8 mg/dL — AB (ref 8.9–10.3)
CHLORIDE: 102 mmol/L (ref 101–111)
CO2: 25 mmol/L (ref 22–32)
CO2: 25 mmol/L (ref 22–32)
CREATININE: 0.86 mg/dL (ref 0.61–1.24)
Chloride: 105 mmol/L (ref 101–111)
Creatinine, Ser: 0.99 mg/dL (ref 0.61–1.24)
GFR calc Af Amer: >60 mL/min (ref >60)
GFR calc Af Amer: >60 mL/min (ref >60)
GFR calc non Af Amer: >60 mL/min (ref >60)
GLUCOSE: 144 mg/dL — AB (ref 65–99)
GLUCOSE: 253 mg/dL — AB (ref 65–99)
Potassium: 3.1 mmol/L — ABNORMAL LOW (ref 3.5–5.1)
Potassium: 3.5 mmol/L (ref 3.5–5.1)
Sodium: 137 mmol/L (ref 135–145)
Sodium: 133 mmol/L — ABNORMAL LOW (ref 135–145)

## 2014-09-16 LAB — PHOSPHORUS: PHOSPHORUS: 2.4 mg/dL — AB (ref 2.5–4.6)

## 2014-09-16 LAB — CBC
HCT: 28.1 % — ABNORMAL LOW (ref 39.0–52.0)
Hemoglobin: 9.7 g/dL — ABNORMAL LOW (ref 13.0–17.0)
MCH: 32.1 pg (ref 26.0–34.0)
MCHC: 34.5 g/dL (ref 30.0–36.0)
MCV: 93 fL (ref 78.0–100.0)
Platelets: 184 10*3/uL (ref 150–400)
RBC: 3.02 MIL/uL — AB (ref 4.22–5.81)
RDW: 12.9 % (ref 11.5–15.5)
WBC: 8.9 10*3/uL (ref 4.0–10.5)

## 2014-09-16 LAB — COMPREHENSIVE METABOLIC PANEL
ALT: 38 U/L (ref 17–63)
ANION GAP: 6 (ref 5–15)
AST: 39 U/L (ref 15–41)
Albumin: 2.9 g/dL — ABNORMAL LOW (ref 3.5–5.0)
Alkaline Phosphatase: 155 U/L — ABNORMAL HIGH (ref 38–126)
BUN: 11 mg/dL (ref 6–20)
CALCIUM: 7.7 mg/dL — AB (ref 8.9–10.3)
CO2: 26 mmol/L (ref 22–32)
Chloride: 102 mmol/L (ref 101–111)
Creatinine, Ser: 0.89 mg/dL (ref 0.61–1.24)
GFR calc Af Amer: >60 mL/min (ref >60)
GFR calc non Af Amer: >60 mL/min (ref >60)
GLUCOSE: 228 mg/dL — AB (ref 65–99)
Potassium: 3.5 mmol/L (ref 3.5–5.1)
Sodium: 134 mmol/L — ABNORMAL LOW (ref 135–145)
Total Bilirubin: 0.5 mg/dL (ref 0.3–1.2)
Total Protein: 6.2 g/dL — ABNORMAL LOW (ref 6.5–8.1)

## 2014-09-16 LAB — GLUCOSE, CAPILLARY
GLUCOSE-CAPILLARY: 186 mg/dL — AB (ref 65–99)
GLUCOSE-CAPILLARY: 254 mg/dL — AB (ref 65–99)
GLUCOSE-CAPILLARY: 109 mg/dL — AB (ref 65–99)
Glucose-Capillary: 174 mg/dL — ABNORMAL HIGH (ref 65–99)
Glucose-Capillary: 207 mg/dL — ABNORMAL HIGH (ref 65–99)
Glucose-Capillary: 183 mg/dL — ABNORMAL HIGH (ref 65–99)

## 2014-09-16 LAB — TROPONIN I

## 2014-09-16 LAB — TSH: TSH: 0.869 u[IU]/mL (ref 0.350–4.500)

## 2014-09-16 LAB — MAGNESIUM: MAGNESIUM: 1.5 mg/dL — AB (ref 1.7–2.4)

## 2014-09-16 MED ORDER — OXYCODONE HCL 5 MG PO TABS
10.0000 mg | ORAL_TABLET | ORAL | Status: AC | PRN
  Administered 2014-09-16 – 2014-09-17 (×3): 10 mg via ORAL
  Filled 2014-09-16 (×3): qty 2

## 2014-09-16 MED ORDER — SERTRALINE HCL 50 MG PO TABS
50.0000 mg | ORAL_TABLET | Freq: Every day | ORAL | Status: DC
  Administered 2014-09-16 – 2014-09-18 (×3): 50 mg via ORAL
  Filled 2014-09-16 (×3): qty 1

## 2014-09-16 MED ORDER — METRONIDAZOLE IN NACL 5-0.79 MG/ML-% IV SOLN
500.0000 mg | Freq: Three times a day (TID) | INTRAVENOUS | Status: DC
  Administered 2014-09-16 – 2014-09-22 (×18): 500 mg via INTRAVENOUS
  Filled 2014-09-16 (×18): qty 100

## 2014-09-16 MED ORDER — CEFEPIME HCL 1 G IJ SOLR
1.0000 g | Freq: Three times a day (TID) | INTRAMUSCULAR | Status: AC
  Administered 2014-09-16 – 2014-09-21 (×17): 1 g via INTRAVENOUS
  Filled 2014-09-16 (×17): qty 1

## 2014-09-16 MED ORDER — INSULIN ASPART 100 UNIT/ML ~~LOC~~ SOLN
0.0000 [IU] | Freq: Three times a day (TID) | SUBCUTANEOUS | Status: DC
  Administered 2014-09-16: 8 [IU] via SUBCUTANEOUS
  Administered 2014-09-17: 3 [IU] via SUBCUTANEOUS
  Administered 2014-09-17: 5 [IU] via SUBCUTANEOUS
  Administered 2014-09-17: 2 [IU] via SUBCUTANEOUS
  Administered 2014-09-18: 5 [IU] via SUBCUTANEOUS
  Administered 2014-09-18: 2 [IU] via SUBCUTANEOUS
  Administered 2014-09-19: 8 [IU] via SUBCUTANEOUS
  Administered 2014-09-19 – 2014-09-20 (×3): 3 [IU] via SUBCUTANEOUS

## 2014-09-16 MED ORDER — VANCOMYCIN HCL IN DEXTROSE 1-5 GM/200ML-% IV SOLN
1000.0000 mg | Freq: Two times a day (BID) | INTRAVENOUS | Status: DC
  Administered 2014-09-16 – 2014-09-20 (×10): 1000 mg via INTRAVENOUS
  Filled 2014-09-16 (×11): qty 200

## 2014-09-16 MED ORDER — TRAZODONE HCL 100 MG PO TABS
100.0000 mg | ORAL_TABLET | Freq: Every day | ORAL | Status: DC
  Administered 2014-09-16 – 2014-09-25 (×10): 100 mg via ORAL
  Filled 2014-09-16 (×10): qty 1

## 2014-09-16 MED ORDER — GABAPENTIN 100 MG PO CAPS
100.0000 mg | ORAL_CAPSULE | Freq: Three times a day (TID) | ORAL | Status: DC
  Administered 2014-09-16 – 2014-09-18 (×6): 100 mg via ORAL
  Filled 2014-09-16 (×8): qty 1

## 2014-09-16 MED ORDER — POTASSIUM CHLORIDE CRYS ER 20 MEQ PO TBCR
20.0000 meq | EXTENDED_RELEASE_TABLET | Freq: Once | ORAL | Status: AC
  Administered 2014-09-24: 20 meq via ORAL
  Filled 2014-09-16: qty 1

## 2014-09-16 MED ORDER — MAGNESIUM SULFATE 2 GM/50ML IV SOLN
2.0000 g | Freq: Once | INTRAVENOUS | Status: AC
  Administered 2014-09-16: 2 g via INTRAVENOUS
  Filled 2014-09-16: qty 50

## 2014-09-16 MED ORDER — POTASSIUM CHLORIDE CRYS ER 20 MEQ PO TBCR
40.0000 meq | EXTENDED_RELEASE_TABLET | Freq: Once | ORAL | Status: AC
  Administered 2014-09-16: 40 meq via ORAL
  Filled 2014-09-16: qty 2

## 2014-09-16 MED ORDER — ARIPIPRAZOLE 10 MG PO TABS
5.0000 mg | ORAL_TABLET | Freq: Two times a day (BID) | ORAL | Status: DC
  Administered 2014-09-16 – 2014-09-26 (×21): 5 mg via ORAL
  Filled 2014-09-16 (×21): qty 1

## 2014-09-16 MED ORDER — INSULIN ASPART 100 UNIT/ML ~~LOC~~ SOLN
0.0000 [IU] | Freq: Every day | SUBCUTANEOUS | Status: DC
  Administered 2014-09-18: 2 [IU] via SUBCUTANEOUS
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
## 2014-09-16 NOTE — Progress Notes (Signed)
This shift pts belongings inventoried and placed in storage including : clothing, cell phone, BULLETS, 2 pairs glasses, wallet, camera, garmen navigator, ipad cigarettes/ lighter.

## 2014-09-16 NOTE — Progress Notes (Signed)
Patient Demographics  Tyler Burgess, is a 59 y.o. male, DOB - December 20, 1955, ZOX:[id number]  Admit date - 09/15/2014   Admitting Physician Therisa Doyne, MD  Outpatient Primary MD for the patient is No PCP Per Patient  LOS - 1   Chief Complaint  Patient presents with  . Medical Clearance  . Suicidal  . Homicidal        Subjective:   Tyler Burgess today has, No headache, No chest pain, No abdominal pain - No Nausea, No new weakness tingling or numbness, No Cough - SOB. Currently not suicidal or homicidal. Some discomfort in the right foot.  Assessment & Plan    1.DKA in a DM 2 patient due to non compliance - counseled, check A1c, DKA resolved, place on Lantus + ISS.  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 Marlyne Beards, he subsequently did not take his antibiotics as he was supposed to. Now there is evidence of some pus coming out and possible cellulitis and infection. I have switched his antibiotics to vancomycin and cefepime as he is diabetic. Flagyl also added which will cover for his recent C. difficile. Dr. Lajoyce Corners has been consulted.   3. Suicidal and homicidal ideations. Currently resolved. Sitter at site, flight risk. Psych has been consulted with their input.   4. C. Difficile recent. On oral vancomycin continue, recheck C. Difficile.     Code Status: Full  Family Communication: None  Disposition Plan: [facility]   Consults  Dr Lajoyce Corners   Procedures     DVT Prophylaxis  Lovenox    Lab Results  Component Value Date   PLT 184 09/16/2014     Medications  Scheduled Meds: . benztropine  0.5 mg Oral BID  . enoxaparin (LOVENOX) injection  40 mg Subcutaneous Q24H  . insulin aspart  0-15 Units Subcutaneous TID WC  . insulin aspart  0-5 Units Subcutaneous QHS  . insulin glargine  20 Units Subcutaneous QHS  . metronidazole  500 mg Intravenous Q8H  . potassium chloride  20 mEq Oral Once  . saccharomyces boulardii  250 mg Oral BID  . sodium chloride  3 mL Intravenous Q12H  . vancomycin  125 mg Oral 4 times per day   Continuous Infusions:  PRN Meds:.acetaminophen **OR** [DISCONTINUED] acetaminophen, dextrose, ondansetron **OR** [DISCONTINUED] ondansetron (ZOFRAN) IV, oxyCODONE  Antibiotics     Anti-infectives    Start     Dose/Rate Route Frequency Ordered Stop   09/16/14 1230  metroNIDAZOLE (FLAGYL) IVPB 500 mg     500 mg 100 mL/hr over 60 Minutes Intravenous Every 8 hours 09/16/14 1219     09/16/14 0000  vancomycin (VANCOCIN) 50 mg/mL oral solution 125 mg     125 mg Oral 4 times per day 09/15/14 2101     09/15/14 2200  doxycycline (VIBRA-TABS) tablet 100 mg  Status:  Discontinued     100 mg Oral Every 12 hours 09/15/14 2101 09/16/14 1219        Objective:   Tyler Burgess  Vitals:   09/15/14 2351 09/16/14 0336 09/16/14 0622 09/16/14 0800  BP: 154/83 155/81 149/94   Pulse: 64 55 61   Temp: 99 F (37.2 C) 98.2 F (36.8 C) 98.2 F (36.8 C)   TempSrc: Oral Oral Oral   Resp: Height:     (1.778 m)  Weight:  84.86 kg (187 lb 1.3 oz)  74.844 kg (165 lb)  SpO2: 100% 99% 100%     Wt Readings from Last 3 Encounters:  09/16/14 74.844 kg (165 lb)  09/03/14 85.73 kg (189 lb)  07/24/14 74.844 kg (165 lb)     Intake/Output Summary (Last 24 hours) at 09/16/14 1219 Last data filed at 09/16/14 1203  Gross per 24 hour  Intake    480 ml  Output   1850 ml  Net  -1370 ml     Physical Exam  Awake Alert, Oriented X 3, No new F.N deficits, Normal affect [place].AT,PERRAL Supple Neck,No JVD, No cervical  lymphadenopathy appriciated.  Symmetrical Chest wall movement, Good air movement bilaterally, CTAB RRR,No Gallops,Rubs or new Murmurs, No Parasternal Heave +ve B.Sounds, Abd Soft, No tenderness, No organomegaly appriciated, No rebound - guarding or rigidity. No Cyanosis, Clubbing or edema, No new Rash or bruise  Right first and second baked 2 amputated, post op site still has sutures with some pus oozing out, minimal swelling and cellulitis.   Data Review   Micro Results No results found for this or any previous visit (from the past 240 hour(s)).  Radiology Reports    Dg Foot 2 Views Right  09/16/2014   CLINICAL DATA:  Osteomyelitis.  EXAM: RIGHT FOOT - 2 VIEW  COMPARISON:  08/31/2014  FINDINGS: Interval resection of the first and second toe. There is now cortical irregularity of the distal first metatarsal consistent with osteomyelitis. Soft tissue air noted in the surgical bed about the second digit. No additional site concerning for osteomyelitis.  IMPRESSION: Post resection of the first and second toes. 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   Electronically Signed   By: Rubye Oaks M.D.   On: 09/16/2014 02:48        CBC  Recent Labs Lab 09/15/14 1647 09/15/14 2120 09/16/14 0522  WBC 11.1* 11.2* 8.9  HGB 11.3* 9.5* 9.7*  HCT 37.2* 27.8* 28.1*  PLT 257 214 184  MCV 102.8* 92.1 93.0  MCH 31.2 31.5 32.1  MCHC 30.4 34.2 34.5  RDW 13.3 12.9 12.9    Chemistries   Recent Labs Lab 09/15/14 1647 09/15/14 2120 09/16/14 0105 09/16/14 0522 09/16/14 0934  NA 126* 137 137 134* 133*  K 4.2 3.2* 3.1* 3.5 3.5  CL 90* 104 105 102 102  CO2 20* GLUCOSE 1016* 161* 144* 228* 253*  BUN CREATININE 1.51* 0.99 0.99 0.89 0.86  CALCIUM 7.9* 7.6* 7.5* 7.7* 7.8*  MG  --   --   --  1.5*  --   AST 96*  --   --  39  --   ALT 51  --   --  38  --   ALKPHOS 212*  --   --  155*  --   BILITOT 0.3  --   --  0.5  --     ------------------------------------------------------------------------------------------------------------------ estimated creatinine clearance is 97.9 mL/min (by C-G formula based on Cr of 0.86). ------------------------------------------------------------------------------------------------------------------ No results for input(s): HGBA1C in the last 72 hours. ------------------------------------------------------------------------------------------------------------------ No results  for input(s): CHOL, HDL, LDLCALC, TRIG, CHOLHDL, LDLDIRECT in the last 72 hours. ------------------------------------------------------------------------------------------------------------------  Recent Labs  09/16/14 0522  TSH 0.869   ------------------------------------------------------------------------------------------------------------------ No results for input(s): VITAMINB12, FOLATE, FERRITIN, TIBC, IRON, RETICCTPCT in the last 72 hours.  Coagulation profile No results for input(s): INR, PROTIME in the last 168 hours.  No results for input(s): DDIMER in the last 72 hours.  Cardiac Enzymes  Recent Labs Lab 09/15/14 2120 09/16/14 0105 09/16/14 0934  TROPONINI <0.03 <0.03 <0.03   ------------------------------------------------------------------------------------------------------------------ Invalid input(s): POCBNP   Time Spent in minutes   35   Iseah Plouff K M.D on 09/16/2014 at 12:19 PM  Between 7am to 7pm - Pager - [phone]  After 7pm go to [email or web address] - password [email or web address]  [facility]   Office  [phone]

## 2014-09-16 NOTE — Evaluation (Signed)
Physical Therapy Evaluation Patient Details Name: Tyler Burgess MRN: [id number] DOB: July 26, 1955 Today's Date: 09/16/2014   History of Present Illness  59 yo male adm 09/15/14 with DKA, recent great and second toe/TMA at [facility], xrays reveal osteo and ortho consult pending; PMHx:  depression, DM, psych d/o with HI and SI  Clinical Impression  Will continue to follow for any needs and to address deficits below , awaiting ortho input (pt with osteomyelitis and recent amp R foot); Pt moves well at this time---encouraged use of RW to limit wt on RLE, may benefit from [facility] shoe if no further surgical intervention;     Follow Up Recommendations No PT follow up (pt is homeless)    Equipment Recommendations  None recommended by PT    Recommendations for Other Services       Precautions / Restrictions Precautions Precautions: Fall Restrictions Other Position/Activity Restrictions: no WBing orders specified this adm  (NWB per RN notes from 1wk ago)??      Mobility  Bed Mobility Overal bed mobility: Independent                Transfers Overall transfer level: Modified independent Equipment used: Rolling walker (2 wheeled)                Ambulation/Gait Ambulation/Gait assistance: Supervision Ambulation Distance (Feet): 110 Feet Assistive device: Rolling walker (2 wheeled)       General Gait Details: cues to lessen wt on RLE, place wt on heel  Stairs            Wheelchair Mobility    Modified Rankin (Stroke Patients Only)       Balance Overall balance assessment: No apparent balance deficits (not formally assessed)                                           Pertinent Vitals/Pain Pain Assessment: 0-10 Pain Score: 6  Pain Location: R foot Pain Descriptors / Indicators: Aching Pain Intervention(s): Limited activity within patient's tolerance;Monitored during session;Repositioned    Home Living Family/patient expects to be discharged to::  Shelter/Homeless Living Arrangements: Alone                    Prior Function                 Hand Dominance        Extremity/Trunk Assessment   Upper Extremity Assessment: Overall WFL for tasks assessed           Lower Extremity Assessment: RLE deficits/detail RLE Deficits / Details: recent TMA 1st and 2nd toes, incision with sutures distal foot, open and draining at time of PT eval, RN aware; strength and AROM grossly WFL       Communication   Communication: No difficulties  Cognition Arousal/Alertness: Awake/alert Behavior During Therapy: WFL for tasks assessed/performed Overall Cognitive Status: Within Functional Limits for tasks assessed                      General Comments      Exercises        Assessment/Plan    PT Assessment Patient needs continued PT services  PT Diagnosis Difficulty walking   PT Problem List Decreased strength;Decreased mobility;Decreased activity tolerance  PT Treatment Interventions Gait training;Functional mobility training;Therapeutic exercise;DME instruction   PT Goals (Current goals can be found in the Care Plan  section) Acute Rehab PT Goals Patient Stated Goal: none stated PT Goal Formulation: With patient Time For Goal Achievement: 09/23/14 Potential to Achieve Goals: Good    Frequency Min 2X/week   Barriers to discharge Other (comment) homeless    Co-evaluation               End of Session   Activity Tolerance: Patient tolerated treatment well;No increased pain Patient left: in bed;with call bell/phone within reach;with nursing/sitter in room Nurse Communication: Mobility status         Time: [id number] PT Time Calculation (min) (ACUTE ONLY): 12 min   Charges:   PT Evaluation $Initial PT Evaluation Tier I: 1 Procedure     PT G CodesDrucilla Chalet:        Alexzandrea Normington 09/16/2014, 1:37 PM

## 2014-09-16 NOTE — Progress Notes (Signed)
ANTIBIOTIC CONSULT NOTE - INITIAL  Pharmacy Consult for vancomycin/Cefepime Indication: Osteomyelitis  No Known Allergies  Patient Measurements: Height: 5\' 10"  (177.8 cm) (per pt) Weight: 165 lb (74.844 kg) (per pt) IBW/kg (Calculated) : 73 Adjusted Body Weight:   Vital Signs: Temp: 98.2 F (36.8 C) (05/22 0622) Temp Source: Oral (05/22 0622) BP: 149/94 mmHg (05/22 0622) Pulse Rate: 61 (05/22 0622) Intake/Output from previous day: 05/21 0701 - 05/22 0700 In: -  Out: 1250 [Urine:1250] Intake/Output from this shift: Total I/O In: 480 [P.O.:480] Out: 600 [Urine:600]  Labs:  Recent Labs  09/15/14 1647 09/15/14 2120 09/16/14 0105 09/16/14 0522 09/16/14 0934  WBC 11.1* 11.2*  --  8.9  --   HGB 11.3* 9.5*  --  9.7*  --   PLT 257 214  --  184  --   CREATININE 1.51* 0.99 0.99 0.89 0.86   Estimated Creatinine Clearance: 97.9 mL/min (by C-G formula based on Cr of 0.86). No results for input(s): VANCOTROUGH, VANCOPEAK, VANCORANDOM, GENTTROUGH, GENTPEAK, GENTRANDOM, TOBRATROUGH, TOBRAPEAK, TOBRARND, AMIKACINPEAK, AMIKACINTROU, AMIKACIN in the last 72 hours.   Microbiology: Recent Results (from the past 720 hour(s))  Wound culture     Status: None   Collection Time: 08/31/14  8:05 AM  Result Value Ref Range Status   Specimen Description FOOT RIGHT  Final   Special Requests NONE  Final   Gram Stain   Final    RARE WBC PRESENT, PREDOMINANTLY PMN NO SQUAMOUS EPITHELIAL CELLS SEEN MODERATE GRAM POSITIVE COCCI IN PAIRS IN CLUSTERS IN CHAINS Performed at [facility]    Culture   Final    MODERATE STAPHYLOCOCCUS AUREUS Note: RIFAMPIN AND GENTAMICIN SHOULD NOT BE USED AS SINGLE DRUGS FOR TREATMENT OF STAPH INFECTIONS. Performed at [facility]    Report Status 09/03/2014 FINAL  Final   Organism ID, Bacteria STAPHYLOCOCCUS AUREUS  Final      Susceptibility   Staphylococcus aureus - MIC*    CLINDAMYCIN >=8 RESISTANT Resistant     ERYTHROMYCIN >=8  RESISTANT Resistant     GENTAMICIN <=0.5 SENSITIVE Sensitive     LEVOFLOXACIN 0.25 SENSITIVE Sensitive     OXACILLIN <=0.25 SENSITIVE Sensitive     PENICILLIN <=0.03 SENSITIVE Sensitive     RIFAMPIN <=0.5 SENSITIVE Sensitive     TRIMETH/SULFA <=10 SENSITIVE Sensitive     VANCOMYCIN <=0.5 SENSITIVE Sensitive     TETRACYCLINE <=1 SENSITIVE Sensitive     MOXIFLOXACIN <=0.25 SENSITIVE Sensitive     * MODERATE STAPHYLOCOCCUS AUREUS  Culture, blood (routine x 2)     Status: None   Collection Time: 08/31/14  8:25 AM  Result Value Ref Range Status   Specimen Description LEFT ANTECUBITAL  Final   Special Requests BOTTLES DRAWN AEROBIC AND ANAEROBIC 6CC  Final   Culture NO GROWTH 5 DAYS  Final   Report Status 09/05/2014 FINAL  Final  Culture, blood (routine x 2)     Status: None   Collection Time: 08/31/14  8:27 AM  Result Value Ref Range Status   Specimen Description BLOOD RIGHT ARM  Final   Special Requests BOTTLES DRAWN AEROBIC AND ANAEROBIC 6CC  Final   Culture NO GROWTH 5 DAYS  Final   Report Status 09/05/2014 FINAL  Final  Urine culture     Status: None   Collection Time: 08/31/14  9:05 AM  Result Value Ref Range Status   Specimen Description URINE, RANDOM  Final   Special Requests NONE  Final   Colony Count NO GROWTH Performed  at [facility]   Final   Culture NO GROWTH Performed at [facility]   Final   Report Status 09/02/2014 FINAL  Final  Clostridium Difficile by PCR     Status: Abnormal   Collection Time: 09/01/14  6:15 AM  Result Value Ref Range Status   C difficile by pcr POSITIVE (A) NEGATIVE Final    Comment: CRITICAL RESULT CALLED TO, READ BACK BY AND VERIFIED WITH: Alberteen Spindle AT 1308 ON 09/01/14 BY WOODS, M    Surgical pcr screen     Status: None   Collection Time: 09/03/14  6:33 AM  Result Value Ref Range Status   MRSA, PCR NEGATIVE NEGATIVE Final   Staphylococcus aureus NEGATIVE NEGATIVE Final    Comment:        The Xpert SA Assay  (FDA approved for NASAL specimens in patients over 57 years of age), is one component of a comprehensive surveillance program.  Test performance has been validated by [facility] for patients greater than or equal to 57 year old. It is not intended to diagnose infection nor to guide or monitor treatment.     Medical History: Past Medical History  Diagnosis Date  . Diabetes mellitus without complication   . Depression    Assessment: 59 YOM presents with SI and HI.  He had recent amputation (5/9) of the R first and second toe d/t infection/osteomyelitis.  Physician notes pus coming from site of amputation with possible surrounding cellulitis.  He states he did not take is doxycycline post-operatively. Vancomycin PO and metronidazole IV for C. Difficile.  He was not taking his oral vancomycin as ordered prior to admission  5/22 >> vancomycin  5/22 >> Cefepime 5/22 >> metronidazole  5/22 >> PO vancomycin   08/31/14: R foot wound: MSSA  5/ C. Difficile PCR: ordered  WBC WNL SCr WNL for normalized CrCl = 70ml/min afebrile  Goal of Therapy:  Vancomycin trough level 15-20 mcg/ml  Plan:   Vancomycin  IV q12h  Check trough if remains on > 4 days  Monitor renal function (h/o DM)  Cefepime 1gm IV q8h  Note culture wounds from 5/6 with MSSA and consider narrowing based on these results  Juliette Alcide, PharmD, BCPS.   Pager: [phone] 09/16/2014,12:31 PM

## 2014-09-16 NOTE — Progress Notes (Signed)
Pt arrived to unit room 1509 via stretcher with SI sitter. VS taken and pt oriented to room. Pt  C/o 8/10 pain to R foot. Initial assessment completed. Will continue to monitor throughout shift.

## 2014-09-17 ENCOUNTER — Other Ambulatory Visit (HOSPITAL_COMMUNITY): Payer: Self-pay | Admitting: Orthopedic Surgery

## 2014-09-17 DIAGNOSIS — Z590 Homelessness: Secondary | ICD-10-CM

## 2014-09-17 LAB — CBC
HEMATOCRIT: 30.6 % — AB (ref 39.0–52.0)
Hemoglobin: 10.3 g/dL — ABNORMAL LOW (ref 13.0–17.0)
MCH: 31.8 pg (ref 26.0–34.0)
MCHC: 33.7 g/dL (ref 30.0–36.0)
MCV: 94.4 fL (ref 78.0–100.0)
Platelets: 188 10*3/uL (ref 150–400)
RBC: 3.24 MIL/uL — ABNORMAL LOW (ref 4.22–5.81)
RDW: 13 % (ref 11.5–15.5)
WBC: 9.7 10*3/uL (ref 4.0–10.5)

## 2014-09-17 LAB — BASIC METABOLIC PANEL
ANION GAP: 7 (ref 5–15)
BUN: 12 mg/dL (ref 6–20)
CHLORIDE: 104 mmol/L (ref 101–111)
CO2: 26 mmol/L (ref 22–32)
CREATININE: 0.91 mg/dL (ref 0.61–1.24)
Calcium: 8.3 mg/dL — ABNORMAL LOW (ref 8.9–10.3)
GFR calc Af Amer: >60 mL/min (ref >60)
Glucose, Bld: 131 mg/dL — ABNORMAL HIGH (ref 65–99)
Potassium: 4 mmol/L (ref 3.5–5.1)
Sodium: 137 mmol/L (ref 135–145)

## 2014-09-17 LAB — MAGNESIUM: MAGNESIUM: 1.6 mg/dL — AB (ref 1.7–2.4)

## 2014-09-17 LAB — GLUCOSE, CAPILLARY
GLUCOSE-CAPILLARY: 195 mg/dL — AB (ref 65–99)
Glucose-Capillary: 129 mg/dL — ABNORMAL HIGH (ref 65–99)
Glucose-Capillary: 236 mg/dL — ABNORMAL HIGH (ref 65–99)
Glucose-Capillary: 172 mg/dL — ABNORMAL HIGH (ref 65–99)

## 2014-09-17 LAB — HEMOGLOBIN A1C
HEMOGLOBIN A1C: 12.9 % — AB (ref 4.8–5.6)
Hgb A1c MFr Bld: 13 % — ABNORMAL HIGH (ref 4.8–5.6)
MEAN PLASMA GLUCOSE: 324 mg/dL
MEAN PLASMA GLUCOSE: 326 mg/dL

## 2014-09-17 MED ORDER — OXYCODONE HCL 5 MG PO TABS
10.0000 mg | ORAL_TABLET | ORAL | Status: DC | PRN
  Administered 2014-09-17 – 2014-09-18 (×5): 10 mg via ORAL
  Filled 2014-09-17 (×5): qty 2

## 2014-09-17 MED ORDER — POTASSIUM CHLORIDE CRYS ER 20 MEQ PO TBCR
40.0000 meq | EXTENDED_RELEASE_TABLET | Freq: Once | ORAL | Status: AC
  Administered 2014-09-17: 40 meq via ORAL
  Filled 2014-09-17: qty 2

## 2014-09-17 MED ORDER — OXYCODONE HCL 5 MG PO TABS
10.0000 mg | ORAL_TABLET | Freq: Four times a day (QID) | ORAL | Status: DC | PRN
  Administered 2014-09-17: 10 mg via ORAL
  Filled 2014-09-17: qty 2
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
## 2014-09-17 NOTE — Progress Notes (Signed)
Patient Demographics  Tyler Burgess, is a 59 y.o. male, DOB - 1956/01/20, AVW:[id number]  Admit date - 09/15/2014   Admitting Physician Therisa Doyne, MD  Outpatient Primary MD for the patient is No PCP Per Patient  LOS - 2   Chief Complaint  Patient presents with  . Medical Clearance  . Suicidal  . Homicidal        Subjective:   Tyler Burgess today has, No headache, No chest pain, No abdominal pain - No Nausea, No new weakness tingling or numbness, No Cough - SOB. Currently not suicidal or homicidal. Some discomfort in the right foot.  Assessment & Plan    1.DKA in a DM 2 patient due to non compliance - counseled, check A1c, DKA resolved, place on Lantus + ISS.  Lab Results  Component Value Date   HGBA1C 12.7* 08/31/2014   CBG (last 3)   Recent Labs  09/16/14 1608 09/16/14 2130 09/17/14 0709  GLUCAP 254* 109* 129*     2.Osetomylitis right foot - post recent amputation of right first and second bit toe at [facility] by Dr. Marlyne Beards, he subsequently did not take his antibiotics as he was supposed to. Now there is evidence of some pus coming out and possible cellulitis and infection. I have switched his antibiotics to vancomycin and cefepime as he is diabetic. Flagyl also added which will cover for his recent C. difficile. Dr. Lajoyce Corners has been consulted.   3. Suicidal and homicidal ideations. Currently resolved. Psych has been consulted, discussed the case with the psychiatrist likely remove sitter in the next 1-2 days.    4. C. Difficile recent. Stool is now solid, discontinue contact precaution, continue Flagyl for now should help with his diabetic foot infection and provide some C. difficile protection.     Code Status: Full  Family Communication: None  Disposition Plan: [facility]  versus rehabilitation   Consults  Dr Lajoyce Corners   Procedures  he is due for right transmetatarsal amputation later this week by Dr. Lajoyce Corners   DVT Prophylaxis  Lovenox    Lab Results  Component Value Date   PLT 188 09/17/2014    Medications  Scheduled Meds: . ARIPiprazole  5 mg Oral BID  . benztropine  0.5 mg Oral BID  . ceFEPime (MAXIPIME) IV  1 g Intravenous 3 times per day  . enoxaparin (LOVENOX) injection  40 mg Subcutaneous Q24H  . gabapentin  100 mg Oral TID  . insulin aspart  0-15 Units Subcutaneous TID WC  . insulin aspart  0-5 Units Subcutaneous QHS  . insulin glargine  20 Units Subcutaneous QHS  . metronidazole  500 mg Intravenous 3 times per day  . potassium chloride  20 mEq Oral Once  . saccharomyces boulardii  250 mg Oral BID  . sertraline  50 mg Oral Daily  . sodium chloride  3 mL Intravenous Q12H  . traZODone  100 mg Oral QHS  . vancomycin  125 mg Oral 4 times per day  . vancomycin  1,000 mg Intravenous BID   Continuous Infusions:  PRN Meds:.acetaminophen **OR** [DISCONTINUED] acetaminophen, dextrose, ondansetron **OR** [DISCONTINUED] ondansetron (ZOFRAN) IV, oxyCODONE  Antibiotics     Anti-infectives    Start     Dose/Rate Route Frequency Ordered Stop  09/16/14 1500  ceFEPIme (MAXIPIME) 1 g in dextrose 5 % 50 mL IVPB     1 g 100 mL/hr over 30 Minutes Intravenous 3 times per day 09/16/14 1300     09/16/14 1400  metroNIDAZOLE (FLAGYL) IVPB 500 mg     500 mg 100 mL/hr over 60 Minutes Intravenous 3 times per day 09/16/14 1219     09/16/14 1400  vancomycin (VANCOCIN) IVPB 1000 mg/200 mL premix     1,000 mg 200 mL/hr over 60 Minutes Intravenous 2 times daily 09/16/14 1300     09/16/14 0000  vancomycin (VANCOCIN) 50 mg/mL oral solution 125 mg     125 mg Oral 4 times per day 09/15/14 2101     09/15/14 2200  doxycycline (VIBRA-TABS) tablet 100 mg  Status:  Discontinued     100 mg Oral Every 12 hours 09/15/14 2101 09/16/14 1219        Objective:   Filed  Vitals:   09/16/14 0800 09/16/14 1420 09/16/14 2131 09/17/14 0548  BP:  147/80 169/91 139/83  Pulse:  51 56 56  Temp:  98 F (36.7 C) 98.2 F (36.8 C) 98.3 F (36.8 C)  TempSrc:  Oral Oral Oral  Resp:  18 16 16   Height: 5\' 10"  (1.778 m)     Weight: 74.844 kg (165 lb)     SpO2:  100% 99% 98%    Wt Readings from Last 3 Encounters:  09/16/14 74.844 kg (165 lb)  09/03/14 85.73 kg (189 lb)  07/24/14 74.844 kg (165 lb)     Intake/Output Summary (Last 24 hours) at 09/17/14 1116 Last data filed at 09/17/14 0817  Gross per 24 hour  Intake    960 ml  Output   3000 ml  Net  -2040 ml     Physical Exam  Awake Alert, Oriented X 3, No new F.N deficits, Normal affect [place].AT,PERRAL Supple Neck,No JVD, No cervical lymphadenopathy appriciated.  Symmetrical Chest wall movement, Good air movement bilaterally, CTAB RRR,No Gallops,Rubs or new Murmurs, No Parasternal Heave +ve B.Sounds, Abd Soft, No tenderness, No organomegaly appriciated, No rebound - guarding or rigidity. No Cyanosis, Clubbing or edema, No new Rash or bruise  Right first and second baked 2 amputated, post op site still has sutures with some pus oozing out, minimal swelling and cellulitis.   Data Review   Micro Results No results found for this or any previous visit (from the past 240 hour(s)).  Radiology Reports    Dg Foot 2 Views Right  09/16/2014   CLINICAL DATA:  Osteomyelitis.  EXAM: RIGHT FOOT - 2 VIEW  COMPARISON:  08/31/2014  FINDINGS: Interval resection of the first and second toe. There is now cortical irregularity of the distal first metatarsal consistent with osteomyelitis. Soft tissue air noted in the surgical bed about the second digit. No additional site concerning for osteomyelitis.  IMPRESSION: Post resection of the first and second toes. 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   Electronically Signed   By: Rubye OaksMelanie  Ehinger M.D.   On: 09/16/2014 02:48         CBC  Recent Labs Lab 09/15/14 1647 09/15/14 2120 09/16/14 0522 09/17/14 0523  WBC 11.1* 11.2* 8.9 9.7  HGB 11.3* 9.5* 9.7* 10.3*  HCT 37.2* 27.8* 28.1* 30.6*  PLT 257 214 184 188  MCV 102.8* 92.1 93.0 94.4  MCH 31.2 31.5 32.1 31.8  MCHC 30.4 34.2 34.5 33.7  RDW 13.3 12.9 12.9 13.0    Chemistries  Recent Labs Lab 09/15/14 1647 09/15/14 2120 09/16/14 0105 09/16/14 0522 09/16/14 0934 09/17/14 0523  NA 126* 137 137 134* 133* 137  K 4.2 3.2* 3.1* 3.5 3.5 4.0  CL 90* 104 105 102 102 104  CO2 20* GLUCOSE 1016* 161* 144* 228* 253* 131*  BUN CREATININE 1.51* 0.99 0.99 0.89 0.86 0.91  CALCIUM 7.9* 7.6* 7.5* 7.7* 7.8* 8.3*  MG  --   --   --  1.5*  --  1.6*  AST 96*  --   --  39  --   --   ALT 51  --   --  38  --   --   ALKPHOS 212*  --   --  155*  --   --   BILITOT 0.3  --   --  0.5  --   --    ------------------------------------------------------------------------------------------------------------------ estimated creatinine clearance is 92.5 mL/min (by C-G formula based on Cr of 0.91). ------------------------------------------------------------------------------------------------------------------ No results for input(s): HGBA1C in the last 72 hours. ------------------------------------------------------------------------------------------------------------------ No results for input(s): CHOL, HDL, LDLCALC, TRIG, CHOLHDL, LDLDIRECT in the last 72 hours. ------------------------------------------------------------------------------------------------------------------  Recent Labs  09/16/14 0522  TSH 0.869   ------------------------------------------------------------------------------------------------------------------ No results for input(s): VITAMINB12, FOLATE, FERRITIN, TIBC, IRON, RETICCTPCT in the last 72 hours.  Coagulation profile No results for input(s): INR, PROTIME in the last 168 hours.  No results for input(s):  DDIMER in the last 72 hours.  Cardiac Enzymes  Recent Labs Lab 09/15/14 2120 09/16/14 0105 09/16/14 0934  TROPONINI <0.03 <0.03 <0.03   ------------------------------------------------------------------------------------------------------------------ Invalid input(s): POCBNP   Time Spent in minutes   35   Marianela Mandrell K M.D on 09/17/2014 at 11:16 AM  Between 7am to 7pm - Pager - [phone]  After 7pm go to [email or web address] - password [facility]  [facility]   Office  [phone]
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
## 2014-09-18 LAB — GLUCOSE, CAPILLARY
GLUCOSE-CAPILLARY: 126 mg/dL — AB (ref 65–99)
GLUCOSE-CAPILLARY: 118 mg/dL — AB (ref 65–99)
Glucose-Capillary: 240 mg/dL — ABNORMAL HIGH (ref 65–99)
Glucose-Capillary: 240 mg/dL — ABNORMAL HIGH (ref 65–99)

## 2014-09-18 LAB — BASIC METABOLIC PANEL
Anion gap: 8 (ref 5–15)
BUN: 12 mg/dL (ref 6–20)
CALCIUM: 8.7 mg/dL — AB (ref 8.9–10.3)
CO2: 26 mmol/L (ref 22–32)
CREATININE: 1.02 mg/dL (ref 0.61–1.24)
Chloride: 102 mmol/L (ref 101–111)
GFR calc Af Amer: >60 mL/min (ref >60)
GFR calc non Af Amer: >60 mL/min (ref >60)
GLUCOSE: 151 mg/dL — AB (ref 65–99)
Potassium: 4.8 mmol/L (ref 3.5–5.1)
Sodium: 136 mmol/L (ref 135–145)

## 2014-09-18 LAB — CBC
HEMATOCRIT: 33.3 % — AB (ref 39.0–52.0)
HEMOGLOBIN: 10.7 g/dL — AB (ref 13.0–17.0)
MCH: 30.9 pg (ref 26.0–34.0)
MCHC: 32.1 g/dL (ref 30.0–36.0)
MCV: 96.2 fL (ref 78.0–100.0)
PLATELETS: 162 10*3/uL (ref 150–400)
RBC: 3.46 MIL/uL — AB (ref 4.22–5.81)
RDW: 13.4 % (ref 11.5–15.5)
WBC: 7.2 10*3/uL (ref 4.0–10.5)

## 2014-09-18 LAB — CREATININE, SERUM
CREATININE: 1.01 mg/dL (ref 0.61–1.24)
GFR calc Af Amer: >60 mL/min (ref >60)
GFR calc non Af Amer: >60 mL/min (ref >60)

## 2014-09-18 LAB — VANCOMYCIN, TROUGH: Vancomycin Tr: 20 ug/mL (ref 10.0–20.0)

## 2014-09-18 MED ORDER — OXYCODONE HCL 5 MG PO TABS
15.0000 mg | ORAL_TABLET | ORAL | Status: DC | PRN
  Administered 2014-09-18 – 2014-09-22 (×22): 15 mg via ORAL
  Filled 2014-09-18 (×23): qty 3

## 2014-09-18 MED ORDER — GABAPENTIN 100 MG PO CAPS
200.0000 mg | ORAL_CAPSULE | Freq: Three times a day (TID) | ORAL | Status: DC
  Administered 2014-09-18 – 2014-09-24 (×19): 200 mg via ORAL
  Filled 2014-09-18 (×21): qty 2

## 2014-09-18 MED ORDER — SERTRALINE HCL 100 MG PO TABS
100.0000 mg | ORAL_TABLET | Freq: Every day | ORAL | Status: DC
  Administered 2014-09-19 – 2014-09-26 (×8): 100 mg via ORAL
  Filled 2014-09-18 (×8): qty 1

## 2014-09-18 MED ORDER — MAGNESIUM SULFATE IN D5W 10-5 MG/ML-% IV SOLN
1.0000 g | Freq: Once | INTRAVENOUS | Status: AC
  Administered 2014-09-18: 1 g via INTRAVENOUS
  Filled 2014-09-18: qty 100

## 2014-09-18 NOTE — Progress Notes (Signed)
ANTIBIOTIC CONSULT NOTE - Follow-Up  Pharmacy Consult for Vancomycin/Cefepime Indication: Osteomyelitis  No Known Allergies  Patient Measurements: Height:  (177.8 cm) (per pt) Weight: 165 lb (74.844 kg) (per pt) IBW/kg (Calculated) : 73  Vital Signs: Temp: 98.5 F (36.9 C) (05/24 1246) Temp Source: Oral (05/24 1246) BP: 136/79 mmHg (05/24 1246) Pulse Rate: 69 (05/24 1246) Intake/Output from previous day: 05/23 0701 - 05/24 0700 In: 3380 [P.O.:1680; IV Piggyback:1700] Out: 2700 [Urine:2700] Intake/Output from this shift: Total I/O In: 480 [P.O.:480] Out: -   Labs:  Recent Labs  09/16/14 0522  09/17/14 0523 09/18/14 0526 09/18/14 0845  WBC 8.9  --  9.7 7.2  --   HGB 9.7*  --  10.3* 10.7*  --   PLT 184  --  188 162  --   CREATININE 0.89  < > 0.91 1.02 1.01  < > = values in this interval not displayed. Estimated Creatinine Clearance: 83.3 mL/min (by C-G formula based on Cr of 1.01).  Recent Labs  09/18/14 0845  VANCOTROUGH 20     Microbiology: Recent Results (from the past 720 hour(s))  Wound culture     Status: None   Collection Time: 08/31/14  8:05 AM  Result Value Ref Range Status   Specimen Description FOOT RIGHT  Final   Special Requests NONE  Final   Gram Stain   Final    RARE WBC PRESENT, PREDOMINANTLY PMN NO SQUAMOUS EPITHELIAL CELLS SEEN MODERATE GRAM POSITIVE COCCI IN PAIRS IN CLUSTERS IN CHAINS Performed at [facility]    Culture   Final    MODERATE STAPHYLOCOCCUS AUREUS Note: RIFAMPIN AND GENTAMICIN SHOULD NOT BE USED AS SINGLE DRUGS FOR TREATMENT OF STAPH INFECTIONS. Performed at [facility]    Report Status 09/03/2014 FINAL  Final   Organism ID, Bacteria STAPHYLOCOCCUS AUREUS  Final      Susceptibility   Staphylococcus aureus - MIC*    CLINDAMYCIN >=8 RESISTANT Resistant     ERYTHROMYCIN >=8 RESISTANT Resistant     GENTAMICIN <=0.5 SENSITIVE Sensitive     LEVOFLOXACIN 0.25 SENSITIVE Sensitive     OXACILLIN  <=0.25 SENSITIVE Sensitive     PENICILLIN <=0.03 SENSITIVE Sensitive     RIFAMPIN <=0.5 SENSITIVE Sensitive     TRIMETH/SULFA <=10 SENSITIVE Sensitive     VANCOMYCIN <=0.5 SENSITIVE Sensitive     TETRACYCLINE <=1 SENSITIVE Sensitive     MOXIFLOXACIN <=0.25 SENSITIVE Sensitive     * MODERATE STAPHYLOCOCCUS AUREUS  Culture, blood (routine x 2)     Status: None   Collection Time: 08/31/14  8:25 AM  Result Value Ref Range Status   Specimen Description LEFT ANTECUBITAL  Final   Special Requests BOTTLES DRAWN AEROBIC AND ANAEROBIC 6CC  Final   Culture NO GROWTH 5 DAYS  Final   Report Status 09/05/2014 FINAL  Final  Culture, blood (routine x 2)     Status: None   Collection Time: 08/31/14  8:27 AM  Result Value Ref Range Status   Specimen Description BLOOD RIGHT ARM  Final   Special Requests BOTTLES DRAWN AEROBIC AND ANAEROBIC 6CC  Final   Culture NO GROWTH 5 DAYS  Final   Report Status 09/05/2014 FINAL  Final  Urine culture     Status: None   Collection Time: 08/31/14  9:05 AM  Result Value Ref Range Status   Specimen Description URINE, RANDOM  Final   Special Requests NONE  Final   Colony Count NO GROWTH Performed at [facility]   Final   Culture NO GROWTH Performed at [facility]   Final   Report Status 09/02/2014 FINAL  Final  Clostridium Difficile by PCR     Status: Abnormal   Collection Time: 09/01/14  6:15 AM  Result Value Ref Range Status   C difficile by pcr POSITIVE (A) NEGATIVE Final    Comment: CRITICAL RESULT CALLED TO, READ BACK BY AND VERIFIED WITH: Alberteen SpindleWRIGHT, M AT [id number] ON 09/01/14 BY WOODS, M    Surgical pcr screen     Status: None   Collection Time: 09/03/14  6:33 AM  Result Value Ref Range Status   MRSA, PCR NEGATIVE NEGATIVE Final   Staphylococcus aureus NEGATIVE NEGATIVE Final    Comment:        The Xpert SA Assay (FDA approved for NASAL specimens in patients over 57 years of age), is one component of a comprehensive  surveillance program.  Test performance has been validated by [facility] for patients greater than or equal to 57 year old. It is not intended to diagnose infection nor to guide or monitor treatment.     Medical History: Past Medical History  Diagnosis Date  . Diabetes mellitus without complication   . Depression    Assessment: [age] YOM presents with SI and HI.  He had recent amputation (5/9) of the R first and second toe d/t infection/osteomyelitis.  Physician notes pus coming from site of amputation with possible surrounding cellulitis.  He states he did not take his doxycycline post-operatively. Vancomycin PO and metronidazole IV ordered for C. Difficile.  He was not taking his oral vancomycin as ordered prior to admission. Pharmacy consulted to dose IV Vancomycin and Cefepime for osteomyelitis. Patient is scheduled for transmetatarsal R foot amputation on 09/19/14.  5/22 >> IV Vancomycin >>  5/22 >> Cefepime >>  5/22 >> Metronidazole >>  5/22 >> PO vancomycin >>  08/31/14: R foot wound: MSSA  WBC WNL SCr WNL, CrCl ~ 83 mL/min Afebrile Vancomycin trough level = 20 mcg/mL  Goal of Therapy:  Vancomycin trough level 15-20 mcg/ml  Appropriate antibiotic dosing for renal function and indication Eradication of infection  Plan:   Continue Vancomycin 1000mg  IV q12h per therapeutic trough level.  Continue Cefepime 1gm IV q8h.  Monitor renal function, cultures, clinical course.  Note culture wounds from 5/6 with MSSA. Consider narrowing based on these results   Greer PickerelJigna Amando Ishikawa, PharmD, BCPS Pager: [phone] 09/18/2014 1:54 PM

## 2014-09-18 NOTE — Progress Notes (Signed)
OT Cancellation Note  Patient Details Name: Tyler Burgess MRN: [id number] DOB: 05/03/1955   Cancelled Treatment:    Reason Eval/Treat Not Completed: Other (comment).  RN is working on pain control.  Noted plan is for further sx this week.  Will check back.  Emeri Estill 09/18/2014, 11:08 AM  Marica OtterMaryellen Tari Lecount, OTR/L [phone] 09/18/2014

## 2014-09-18 NOTE — H&P (Signed)
Therisa DoyneAnastassia Doutova, MD Physician Signed Internal Medicine H&P 09/15/2014 6:43 PM    Expand All Collapse All     PCP: No PCP Per Patient    Referring provider Will PA   Chief Complaint: Suicidal ideation  HPI: Tyler LoganBilly Burgess is a 59 y.o. male   has a past medical history of Diabetes mellitus without complication and Depression.   Presented with hallucinations as well as suicidal or homicidal towards some people in [place]. As well as some people at [facility] and behavioral health. Patient was supine on going ahead with this plan with his preoperative talk to moderate and he presented to behavioral health. Behavioral Health he was noted to have elevated blood glucose was sent to his [facility] Patient is homeless and lives on his stroke. Was known to say from going to dime taking them visually. Per records patient has extensive psychiatric history with suicide attempt since age of 9 recently got out of jail after too [place] Gunn to facility and was threatening the staff. Patient is homeless type II diabetic. Has insulin that not able to refrigerate. At some point his stated that he was actually out of his Lantus and NovoLog. On arrival to emerge department but glucose 1016 noted to have creatinine 1.5 PCO2 of 20 and anion gap of 16. Patient is being admitted to medicine service for mild DKA. Patient reports not able to afford his medications. States he just want to die to get it over with. He has been out of his medications about 3 months.  He reports having diarrhea for the past few months he has 3-4 BM a day no blood in stool. Her records it seems that he has been treated in the past for C. difficile colitis but was not able to take his medications Had a transmetatarsal amputation in [place] in the setting of osteomyelitis. Patient reports he burned his feet by accident and was not able to get Medical attention.   Hospitalist was called for admission for mild DKA  Review of Systems:     Pertinent positives include: suicidal and homicidal ideations. Seeing Jesus, depression or anxiety.  Constitutional:  No weight loss, night sweats, Fevers, chills, fatigue, weight loss  HEENT:  No headaches, Difficulty swallowing,Tooth/dental problems,Sore throat,  No sneezing, itching, ear ache, nasal congestion, post nasal drip,  Cardio-vascular:  No chest pain, Orthopnea, PND, anasarca, dizziness, palpitations.no Bilateral lower extremity swelling  GI:  No heartburn, indigestion, abdominal pain, nausea, vomiting, diarrhea, change in bowel habits, loss of appetite, melena, blood in stool, hematemesis Resp:  no shortness of breath at rest. No dyspnea on exertion, No excess mucus, no productive cough, No non-productive cough, No coughing up of blood.No change in color of mucus.No wheezing. Skin:  no rash or lesions. No jaundice GU:  no dysuria, change in color of urine, no urgency or frequency. No straining to urinate.  No flank pain.  Musculoskeletal:  No joint pain or no joint swelling. No decreased range of motion. No back pain.  Psych:  No change in mood or affect. No No memory loss.  Neuro: no localizing neurological complaints, no tingling, no weakness, no double vision, no gait abnormality, no slurred speech, no confusion  Otherwise ROS are negative except for above, 10 systems were reviewed  Past Medical History: Past Medical History  Diagnosis Date  . Diabetes mellitus without complication   . Depression    Past Surgical History  Procedure Laterality Date  . Right arm    .  Cholecystectomy    . Amputation toe Right 09/03/2014    Procedure: AMPUTATION RIGHT GREAT TOE AND SECOND TOE; Surgeon: Franky Macho Md, MD; Location: [facility]; Service: General; Laterality: Right; right great and second toes     Medications: Prior to Admission medications   Medication Sig Start Date End Date Taking? Authorizing Provider   insulin aspart (NOVOLOG) 100 UNIT/ML injection Inject 3 Units into the skin 3 (three) times daily with meals. Patient taking differently: Inject 10 Units into the skin 3 (three) times daily with meals.  09/05/14  Yes Jeralyn Bennett, MD  insulin glargine (LANTUS) 100 UNIT/ML injection Inject 0.05 mLs (5 Units total) into the skin at bedtime. Patient taking differently: Inject 24 Units into the skin at bedtime.  09/05/14  Yes Jeralyn Bennett, MD  benztropine (COGENTIN) 0.5 MG tablet Take 1 tablet (0.5 mg total) by mouth 2 (two) times daily. Patient not taking: Reported on 09/15/2014 06/19/14   Gerhard Munch, MD  doxycycline (VIBRA-TABS) 100 MG tablet Take 1 tablet (100 mg total) by mouth every 12 (twelve) hours. Patient not taking: Reported on 09/15/2014 09/05/14   Jeralyn Bennett, MD  hydrOXYzine (ATARAX/VISTARIL) 50 MG tablet Take 1 tablet (50 mg total) by mouth 3 (three) times daily. Patient not taking: Reported on 09/15/2014 06/19/14   Gerhard Munch, MD  Multiple Vitamin (MULTIVITAMIN WITH MINERALS) TABS tablet Take 1 tablet by mouth daily. Patient not taking: Reported on 09/15/2014 04/18/14   Thermon Leyland, NP  oxyCODONE 10 MG TABS Take 1 tablet (10 mg total) by mouth every 6 (six) hours as needed for moderate pain or severe pain. Patient not taking: Reported on 09/15/2014 09/05/14   Jeralyn Bennett, MD  saccharomyces boulardii (FLORASTOR) 250 MG capsule Take 1 capsule (250 mg total) by mouth 2 (two) times daily. Patient not taking: Reported on 09/15/2014 09/05/14   Jeralyn Bennett, MD  vancomycin (VANCOCIN) 50 mg/mL oral solution Take 125 mg PO QID for 14 days QTY sufficient for 14 days Patient not taking: Reported on 09/15/2014 09/05/14   Jeralyn Bennett, MD    Allergies: No Known Allergies  Social History:  Ambulatory independently But states needs a walker or cane  Homeless    reports that he has been smoking Cigarettes. He has  a 17.5 pack-year smoking history. He does not have any smokeless tobacco history on file. He reports that he drinks about 1.2 oz of alcohol per week. He reports that he does not use illicit drugs.    Family History: family history includes Depression in his brother, father, and other; Diabetes in his mother and other.    Physical Exam: Patient Vitals for the past 24 hrs:  BP Temp Temp src Pulse Resp SpO2  09/15/14 1830 175/94 mmHg - - 83 22 100 %  09/15/14 1803 167/93 mmHg - - 71 18 99 %  09/15/14 1626 153/79 mmHg 97.6 F (36.4 C) Oral 102 16 100 %    1. General: in No Acute distress 2. Psychological: Alert and Oriented 3. Head/ENT: Dry Mucous Membranes  Head Non traumatic, neck supple  Poor Dentition 4. SKIN: decreased Skin turgor, Skin clean Dry and intact no rash 5. Heart: Regular rate and rhythm no Murmur, Rub or gallop 6. Lungs: Clear to auscultation bilaterally, no wheezes or crackles  7. Abdomen: Soft, non-tender, Non distended 8. Lower extremities: no clubbing, cyanosis, some swelling of right foot serosanguineous drainage from the site of prior amputation no warmth noted 9. Neurologically Grossly intact, moving all 4 extremities equally 10.  MSK: Normal range of motion  body mass index is unknown because there is no weight on file.   Labs on Admission:    Lab Results Last 24 Hours    Results for orders placed or performed during the hospital encounter of 09/15/14 (from the past 24 hour(s))  POC CBG, ED Status: Abnormal   Collection Time: 09/15/14 4:42 PM  Result Value Ref Range   Glucose-Capillary >600 (HH) 65 - 99 mg/dL   Comment 1 Notify RN    Comment 2 Document in Chart   Acetaminophen level Status: Abnormal   Collection Time: 09/15/14 4:47 PM  Result Value Ref Range   Acetaminophen (Tylenol), Serum <10 (L) 10 - 30 ug/mL  CBC  Status: Abnormal   Collection Time: 09/15/14 4:47 PM  Result Value Ref Range   WBC 11.1 (H) 4.0 - 10.5 K/uL   RBC 3.62 (L) 4.22 - 5.81 MIL/uL   Hemoglobin 11.3 (L) 13.0 - 17.0 g/dL   HCT 16.1 (L) 09.6 - 04.5 %   MCV 102.8 (H) 78.0 - 100.0 fL   MCH 31.2 26.0 - 34.0 pg   MCHC 30.4 30.0 - 36.0 g/dL   RDW 40.9 81.1 - 91.4 %   Platelets 257 150 - 400 K/uL  Comprehensive metabolic panel Status: Abnormal   Collection Time: 09/15/14 4:47 PM  Result Value Ref Range   Sodium 126 (L) 135 - 145 mmol/L   Potassium 4.2 3.5 - 5.1 mmol/L   Chloride 90 (L) 101 - 111 mmol/L   CO2 20 (L) 22 - 32 mmol/L   Glucose, Bld 1016 (HH) 65 - 99 mg/dL   BUN 14 6 - 20 mg/dL   Creatinine, Ser 7.82 (H) 0.61 - 1.24 mg/dL   Calcium 7.9 (L) 8.9 - 10.3 mg/dL   Total Protein 7.2 6.5 - 8.1 g/dL   Albumin 3.3 (L) 3.5 - 5.0 g/dL   AST 96 (H) 15 - 41 U/L   ALT 51 17 - 63 U/L   Alkaline Phosphatase 212 (H) 38 - 126 U/L   Total Bilirubin 0.3 0.3 - 1.2 mg/dL   GFR calc non Af Amer 50 (L) >60 mL/min   GFR calc Af Amer 57 (L) >60 mL/min   Anion gap 16 (H) 5 - 15  Ethanol (ETOH) Status: Abnormal   Collection Time: 09/15/14 4:47 PM  Result Value Ref Range   Alcohol, Ethyl (B) 57 (H) <5 mg/dL  Salicylate level Status: None   Collection Time: 09/15/14 4:47 PM  Result Value Ref Range   Salicylate Lvl <4.0 2.8 - 30.0 mg/dL  Urine Drug Screen Status: None   Collection Time: 09/15/14 4:50 PM  Result Value Ref Range   Opiates NONE DETECTED NONE DETECTED   Cocaine NONE DETECTED NONE DETECTED   Benzodiazepines NONE DETECTED NONE DETECTED   Amphetamines NONE DETECTED NONE DETECTED   Tetrahydrocannabinol NONE DETECTED NONE DETECTED   Barbiturates NONE DETECTED NONE DETECTED  Urinalysis, Routine w reflex microscopic Status: Abnormal    Collection Time: 09/15/14 4:50 PM  Result Value Ref Range   Color, Urine YELLOW YELLOW   APPearance CLEAR CLEAR   Specific Gravity, Urine 1.029 1.005 - 1.030   pH 6.0 5.0 - 8.0   Glucose, UA >1000 (A) NEGATIVE mg/dL   Hgb urine dipstick NEGATIVE NEGATIVE   Bilirubin Urine NEGATIVE NEGATIVE   Ketones, ur NEGATIVE NEGATIVE mg/dL   Protein, ur NEGATIVE NEGATIVE mg/dL   Urobilinogen, UA 0.2 0.0 - 1.0 mg/dL   Nitrite NEGATIVE NEGATIVE  Leukocytes, UA NEGATIVE NEGATIVE  Urine microscopic-add on Status: None   Collection Time: 09/15/14 4:50 PM  Result Value Ref Range   Squamous Epithelial / LPF RARE RARE   WBC, UA 0-2 <3 WBC/hpf   RBC / HPF 3-6 <3 RBC/hpf   Bacteria, UA RARE RARE   Urine-Other RARE YEAST   CBG monitoring, ED Status: Abnormal   Collection Time: 09/15/14 6:32 PM  Result Value Ref Range   Glucose-Capillary >600 (HH) 65 - 99 mg/dL      UA glucose >9562   Recent Labs    Lab Results  Component Value Date   HGBA1C 12.7* 08/31/2014      Estimated Creatinine Clearance: 55.7 mL/min (by C-G formula based on Cr of 1.51).  BNP (last 3 results)  Recent Labs (within last 365 days)    No results for input(s): PROBNP in the last 8760 hours.    Other results:  I have pearsonaly reviewed this: ECG REPORT  Rate: 77 Rhythm: Sinus rhythm ST&T Change: No ischemic changes QTc 468  There were no vitals filed for this visit.   Cultures:  Labs (Brief)       Component Value Date/Time   SDES URINE, RANDOM 08/31/2014 0905   SPECREQUEST NONE 08/31/2014 0905   CULT NO GROWTH Performed at [facility]  08/31/2014 0905   REPTSTATUS 09/02/2014 FINAL 08/31/2014 0905      Radiological Exams on Admission:  Imaging Results (Last 48 hours)    No results found.    Chart has been reviewed  Family not at  Bedside   Assessment/Plan  59 year old gentleman with prior history of significant diabetes poorly controlled utilizing listhesis. As well as psychiatric disorder ventricular technicians for suicidal and homicidal ideation presents with homicidal ideation but it was found to have mild DKA. Had recent amputation of the right forefoot in the setting of osteomyelitis. He has not been compliant with antibiotics. Recent treatment for C. difficile also has not been compliant of recurrence of diarrhea  Present on Admission:  . DKA, type 2, not at goal - continue glucose stabilizes frequent blood work, transition back to Lantus once gap is closed, rehydrate, social work consult was patient will need to have helped with his medications  . Type 2 diabetes mellitus with peripheral neuropathy - poor compliance secondary to homeless should've social work consult ordered  . Osteomyelitis - she has not been compliant with his medications will obtain plain emergent foot to reevaluate for possible recurrent osteomyelitis. Restart doxycycline. Patient will likely benefit from orthopedics consult in the morning  . Hyponatremia - partially corrects likely explained by hyperglycemia and dehydration  Diarrhea suspect C. difficile will restart by mouth vancomycin  suicidal or homicidal ideations. Restart home medications psychiatric consult in the morning   Prophylaxis: Lovenox   CODE STATUS: DNR/DNI as per patient while suicidal has to be overturned to FULL CODE  Disposition: likely will need placement for rehabilitation if able, will need social work consult    Other plan as per orders.  I have spent a total of 55 min on this admission  DOUTOVA,ANASTASSIA 09/15/2014, 6:43 PM  [facility]  Pager [phone]   after 2 AM please page floor coverage PA If 7AM-7PM, please contact the day team taking care of the patient  [email or web address]  Password [email or web address]        Routing  History     Date/Time From To Method   09/15/2014 8:04 PM Therisa Doyne, MD No Pcp Per Patient In Basket
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
## 2014-09-18 NOTE — Care Management Note (Signed)
Case Management Note  Patient Details  Name: Tyler Burgess MRN: [id number] Date of Birth: 09/17/1955  Subjective/Objective:      59 yo male admitted with osteomylitis              Action/Plan:  Patient stated that he had Medicaid in [place] and [place]. He moved to [place] to be with his brother who resides in [place]. He stated when he tried to reapply for Medicaid in [place] they told him that he is not eligible and would have to file for disability. Patient stated that since he has lived in [place] he has not had any follow up for medical care because he is unable to afford care and medications. He said he had insulin but stopped using it since it wasn't refrigerated. He stated that he does have a glucometer, test strips, lancets, and syringes, but does not have insulin. Discussed the [facility] - homeless day shelter- in which he can receive medical care and medications at no cost, and they can also assist with shelter, short-term, long-term housing. Patient stated that he has been living out of his truck. Will gather information regarding [facility] to provide for patient. He is hoping to go to a SNF upon discharge and CSW aware. Will continue to follow regarding discharge planning and resources.  Expected Discharge Date:  09/18/14               Expected Discharge Plan:  Home/Self Care  In-House Referral:  Clinical Social Work  Discharge planning Services  CM Consult, Indigent Health Clinic  Post Acute Care Choice:    Choice offered to:     DME Arranged:    DME Agency:     HH Arranged:    HH Agency:     Status of Service:  In process, will continue to follow  Medicare Important Message Given:  No Date Medicare IM Given:    Medicare IM give by:    Date Additional Medicare IM Given:    Additional Medicare Important Message give by:     If discussed at Long Length of Stay Meetings, dates discussed:    Additional Comments:  [name], [name] S, RN 09/18/2014, 2:24 PM

## 2014-09-18 NOTE — Progress Notes (Signed)
Patient Demographics  Tyler Burgess, is a 59 y.o. male, DOB - 28-Jun-1955, BJY:[id number]  Admit date - 09/15/2014   Admitting Physician Therisa Doyne, MD  Outpatient Primary MD for the patient is No PCP Per Patient  LOS - 3   Chief Complaint  Patient presents with  . Medical Clearance  . Suicidal  . Homicidal        Subjective:   Tyler Burgess today has, No headache, No chest pain, No abdominal pain - No Nausea, No new weakness tingling or numbness, No Cough - SOB. Currently not suicidal or homicidal but at times he does get sad. Some discomfort in the right foot.  Assessment & Plan    1.DKA in a DM 2 patient due to non compliance - counseled, check A1c, DKA resolved, place on Lantus + ISS.  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 Marlyne Beards, he subsequently did not take his antibiotics as he was supposed to. Now there is evidence of some pus coming out and possible cellulitis and infection. I have switched his antibiotics to vancomycin and cefepime as he is diabetic. Flagyl also added which will cover for his recent C. difficile. Dr. Lajoyce Corners has been consulted.  He is likely for transmetatarsal right foot amputation later this week per Dr. Benjaman Lobe. He will be low to moderate risk candidate for adverse cardiopulmonary outcome. Preoperative EKG negative. He is otherwise active at home can walk half a mile to a mile without any issues. No known cardiac history.   3. Suicidal and homicidal ideations. Currently resolved. Psych has been consulted, discussed the case with  the psychiatrist likely remove sitter in the next 1-2 days.    4. C. Difficile recent. Stool is now solid, discontinue contact precaution, continue Flagyl for now should help with his diabetic foot infection and provide some C. difficile protection.     Code Status: Full  Family Communication: None  Disposition Plan: [facility] versus rehabilitation   Consults  Dr Lajoyce Corners   Procedures  he is due for right transmetatarsal amputation later this week by Dr. Lajoyce Corners   DVT Prophylaxis  Lovenox    Lab Results  Component Value Date   PLT 162 09/18/2014    Medications  Scheduled Meds: . ARIPiprazole  5 mg Oral BID  . benztropine  0.5 mg Oral BID  . ceFEPime (MAXIPIME) IV  1 g Intravenous 3 times per day  . enoxaparin (LOVENOX) injection  40 mg Subcutaneous Q24H  . gabapentin  100 mg Oral TID  . insulin aspart  0-15 Units Subcutaneous TID WC  . insulin aspart  0-5 Units Subcutaneous QHS  . insulin glargine  20 Units Subcutaneous QHS  . metronidazole  500 mg Intravenous 3 times per day  . potassium chloride  20 mEq Oral Once  . saccharomyces boulardii  250 mg Oral BID  . sertraline  50 mg Oral Daily  . sodium chloride  3 mL Intravenous Q12H  . traZODone  100 mg Oral  QHS  . vancomycin  125 mg Oral 4 times per day  . vancomycin  1,000 mg Intravenous BID   Continuous Infusions:  PRN Meds:.acetaminophen **OR** [DISCONTINUED] acetaminophen, dextrose, ondansetron **OR** [DISCONTINUED] ondansetron (ZOFRAN) IV, oxyCODONE  Antibiotics     Anti-infectives    Start     Dose/Rate Route Frequency Ordered Stop   09/16/14 1500  ceFEPIme (MAXIPIME) 1 g in dextrose 5 % 50 mL IVPB     1 g 100 mL/hr over 30 Minutes Intravenous 3 times per day 09/16/14 1300     09/16/14 1400  metroNIDAZOLE (FLAGYL) IVPB 500 mg     500 mg 100 mL/hr over 60 Minutes Intravenous 3 times per day 09/16/14 1219     09/16/14 1400  vancomycin (VANCOCIN) IVPB 1000 mg/200 mL premix     1,000 mg 200 mL/hr over 60 Minutes  Intravenous 2 times daily 09/16/14 1300     09/16/14 0000  vancomycin (VANCOCIN) 50 mg/mL oral solution 125 mg     125 mg Oral 4 times per day 09/15/14 2101     09/15/14 2200  doxycycline (VIBRA-TABS) tablet 100 mg  Status:  Discontinued     100 mg Oral Every 12 hours 09/15/14 2101 09/16/14 1219        Objective:   Filed Vitals:   09/17/14 1819 09/17/14 2122 09/18/14 0550 09/18/14 0956  BP: 148/80 159/91 172/88 106/73  Pulse: 66 59 58 65  Temp: 98.8 F (37.1 C) 98.5 F (36.9 C) 98.3 F (36.8 C) 98.4 F (36.9 C)  TempSrc: Oral Oral Oral Oral  Resp: Height:      Weight:      SpO2: 96% 100% 99% 98%    Wt Readings from Last 3 Encounters:  09/16/14 74.844 kg (165 lb)  09/03/14 85.73 kg (189 lb)  07/24/14 74.844 kg (165 lb)     Intake/Output Summary (Last 24 hours) at 09/18/14 1107 Last data filed at 09/18/14 0911  Gross per 24 hour  Intake   3140 ml  Output   2700 ml  Net    440 ml     Physical Exam  Awake Alert, Oriented X 3, No new F.N deficits, Normal affect [place].AT,PERRAL Supple Neck,No JVD, No cervical lymphadenopathy appriciated.  Symmetrical Chest wall movement, Good air movement bilaterally, CTAB RRR,No Gallops,Rubs or new Murmurs, No Parasternal Heave +ve B.Sounds, Abd Soft, No tenderness, No organomegaly appriciated, No rebound - guarding or rigidity. No Cyanosis, Clubbing or edema, No new Rash or bruise  Right first and second baked 2 amputated, post op site still has sutures with some pus oozing out, minimal swelling and cellulitis.   Data Review   Micro Results No results found for this or any previous visit (from the past 240 hour(s)).  Radiology Reports    Dg Foot 2 Views Right  09/16/2014   CLINICAL DATA:  Osteomyelitis.  EXAM: RIGHT FOOT - 2 VIEW  COMPARISON:  08/31/2014  FINDINGS: Interval resection of the first and second toe. There is now cortical irregularity of the distal first metatarsal consistent with osteomyelitis. Soft  tissue air noted in the surgical bed about the second digit. No additional site concerning for osteomyelitis.  IMPRESSION: Post resection of the first and second toes. 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   Electronically Signed   By: Rubye Oaks M.D.   On: 09/16/2014 02:48        CBC  Recent Labs Lab 09/15/14 1647 09/15/14 2120  09/16/14 0522 09/17/14 0523 09/18/14 0526  WBC 11.1* 11.2* 8.9 9.7 7.2  HGB 11.3* 9.5* 9.7* 10.3* 10.7*  HCT 37.2* 27.8* 28.1* 30.6* 33.3*  PLT 257 214 184 188 162  MCV 102.8* 92.1 93.0 94.4 96.2  MCH 31.2 31.5 32.1 31.8 30.9  MCHC 30.4 34.2 34.5 33.7 32.1  RDW 13.3 12.9 12.9 13.0 13.4    Chemistries   Recent Labs Lab 09/15/14 1647  09/16/14 0105 09/16/14 0522 09/16/14 0934 09/17/14 0523 09/18/14 0526 09/18/14 0845  NA 126*  < > 137 134* 133* 137 136  --   K 4.2  < > 3.1* 3.5 3.5 4.0 4.8  --   CL 90*  < > 105 102 102 104 102  --   CO2 20*  < > 25 26 25 26 26   --   GLUCOSE 1016*  < > 144* 228* 253* 131* 151*  --   BUN 14  < > 12 11 11 12 12   --   CREATININE 1.51*  < > 0.99 0.89 0.86 0.91 1.02 1.01  CALCIUM 7.9*  < > 7.5* 7.7* 7.8* 8.3* 8.7*  --   MG  --   --   --  1.5*  --  1.6*  --   --   AST 96*  --   --  39  --   --   --   --   ALT 51  --   --  38  --   --   --   --   ALKPHOS 212*  --   --  155*  --   --   --   --   BILITOT 0.3  --   --  0.5  --   --   --   --   < > = values in this interval not displayed. ------------------------------------------------------------------------------------------------------------------ estimated creatinine clearance is 83.3 mL/min (by C-G formula based on Cr of 1.01). ------------------------------------------------------------------------------------------------------------------  Recent Labs  09/16/14 0522 09/16/14 1350  HGBA1C 12.9* 13.0*   ------------------------------------------------------------------------------------------------------------------ No  results for input(s): CHOL, HDL, LDLCALC, TRIG, CHOLHDL, LDLDIRECT in the last 72 hours. ------------------------------------------------------------------------------------------------------------------  Recent Labs  09/16/14 0522  TSH 0.869   ------------------------------------------------------------------------------------------------------------------ No results for input(s): VITAMINB12, FOLATE, FERRITIN, TIBC, IRON, RETICCTPCT in the last 72 hours.  Coagulation profile No results for input(s): INR, PROTIME in the last 168 hours.  No results for input(s): DDIMER in the last 72 hours.  Cardiac Enzymes  Recent Labs Lab 09/15/14 2120 09/16/14 0105 09/16/14 0934  TROPONINI <0.03 <0.03 <0.03   ------------------------------------------------------------------------------------------------------------------ Invalid input(s): POCBNP   Time Spent in minutes   35   Yuliana Vandrunen K M.D on 09/18/2014 at 11:07 AM  Between 7am to 7pm - Pager - [phone]  After 7pm go to [email or web address] - password [facility]  [facility]   Office  [phone]

## 2014-09-18 NOTE — Progress Notes (Signed)
Inpatient Diabetes Program Recommendations  AACE/ADA: New Consensus Statement on Inpatient Glycemic Control (2013)  Target Ranges:  Prepandial:   less than 140 mg/dL      Peak postprandial:   less than 180 mg/dL (1-2 hours)      Critically ill patients:  140 - 180 mg/dL   If patient is discharged on the novolin 70/30 (70% NPH and 30% Regular) which may be more affordable for pt, calculations according to the lantus and novolog used prior would convert to 16 units novolin (or Novolog 70/30 mix if obtainable) bid-16 units in the am and 16 units ac supper.- for total of 10 units NPH and 6 units regular twice a day.This regimen may be more obtainable and easier to use than the lantus and meal coverage. Inpatient Diabetes Program Recommendations Insulin - Basal: Pt's total daily dose of insulin according to what was prescribed prior to hospitalization (but unable to afford) was 24 units lantus and a total of 9 units novolog per day (3 units meal coverage tidwc) Total daily dose insulin for both basal and meal coverage is 33 units per day. If using 70/30 pt would need 16 units bid..  Correction (SSI): xxxxxxxxxxxxx Also noted MD today documented that pt has no PCP per what pt states.  In [place] there are 2 free clinics where pt could be followed: [facility] Have requested care management consult to assess pt's meeting criteria to attend the clinics or to seek other options for healh care follow-up.  Thank you Lenor CoffinAnn Carols Clemence, RN, MSN, CDE  Diabetes Inpatient Program Office: [phone] Pager: [phone] 8:00 am to 5:00 pm

## 2014-09-18 NOTE — Progress Notes (Signed)
PT Cancellation Note  Patient Details Name: Ferrel LoganBilly Helsley MRN: [id number] DOB: 11/16/1955   Cancelled Treatment:    Reason Eval/Treat Not Completed: Other (comment)  Cancel PT until after surgery later this wk--pt may amb with sitter/nursing staff;   Drucilla ChaletWILLIAMS,Midas Daughety 09/18/2014, 9:25 AM
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
## 2014-09-19 ENCOUNTER — Encounter (HOSPITAL_COMMUNITY)
Admission: EM | Disposition: A | Discharge status: Skilled Nursing Facility | Payer: Self-pay | Source: Home / Self Care | Attending: Internal Medicine

## 2014-09-19 ENCOUNTER — Other Ambulatory Visit (HOSPITAL_COMMUNITY): Payer: Self-pay | Admitting: Orthopedic Surgery

## 2014-09-19 ENCOUNTER — Encounter (HOSPITAL_COMMUNITY): Payer: Self-pay | Admitting: Anesthesiology

## 2014-09-19 DIAGNOSIS — F333 Major depressive disorder, recurrent, severe with psychotic symptoms: Secondary | ICD-10-CM

## 2014-09-19 LAB — BASIC METABOLIC PANEL
Anion gap: 6 (ref 5–15)
BUN: 12 mg/dL (ref 6–20)
CO2: 29 mmol/L (ref 22–32)
Calcium: 8.6 mg/dL — ABNORMAL LOW (ref 8.9–10.3)
Chloride: 101 mmol/L (ref 101–111)
Creatinine, Ser: 1 mg/dL (ref 0.61–1.24)
GFR calc Af Amer: >60 mL/min (ref >60)
GFR calc non Af Amer: >60 mL/min (ref >60)
GLUCOSE: 180 mg/dL — AB (ref 65–99)
POTASSIUM: 3.9 mmol/L (ref 3.5–5.1)
SODIUM: 136 mmol/L (ref 135–145)

## 2014-09-19 LAB — GLUCOSE, CAPILLARY
GLUCOSE-CAPILLARY: 237 mg/dL — AB (ref 65–99)
GLUCOSE-CAPILLARY: 274 mg/dL — AB (ref 65–99)
Glucose-Capillary: 151 mg/dL — ABNORMAL HIGH (ref 65–99)
Glucose-Capillary: 177 mg/dL — ABNORMAL HIGH (ref 65–99)

## 2014-09-19 LAB — MAGNESIUM: Magnesium: 1.8 mg/dL (ref 1.7–2.4)

## 2014-09-19 SURGERY — AMPUTATION, FOOT, PARTIAL
Anesthesia: General | Laterality: Right

## 2014-09-19 MED ORDER — ONDANSETRON HCL 4 MG/2ML IJ SOLN
INTRAMUSCULAR | Status: AC
  Filled 2014-09-19: qty 2

## 2014-09-19 MED ORDER — MIDAZOLAM HCL 2 MG/2ML IJ SOLN
INTRAMUSCULAR | Status: AC
  Filled 2014-09-19: qty 2

## 2014-09-19 MED ORDER — LIDOCAINE HCL (CARDIAC) 20 MG/ML IV SOLN
INTRAVENOUS | Status: AC
  Filled 2014-09-19: qty 5

## 2014-09-19 MED ORDER — MAGNESIUM SULFATE IN D5W 10-5 MG/ML-% IV SOLN
1.0000 g | Freq: Once | INTRAVENOUS | Status: AC
  Administered 2014-09-19: 1 g via INTRAVENOUS
  Filled 2014-09-19: qty 100

## 2014-09-19 MED ORDER — FENTANYL CITRATE (PF) 100 MCG/2ML IJ SOLN
INTRAMUSCULAR | Status: AC
  Filled 2014-09-19: qty 2

## 2014-09-19 MED ORDER — PROPOFOL 10 MG/ML IV BOLUS
INTRAVENOUS | Status: AC
  Filled 2014-09-19: qty 20

## 2014-09-19 NOTE — Interval H&P Note (Signed)
History and Physical Interval Note:  09/19/2014 7:05 AM  Tyler Burgess  has presented today for surgery, with the diagnosis of Osteomyelitis Right Foot  The various methods of treatment have been discussed with the patient and family. After consideration of risks, benefits and other options for treatment, the patient has consented to  Procedure(s): Right Transmetatarsal Amputation (Right) as a surgical intervention .  The patient's history has been reviewed, patient examined, no change in status, stable for surgery.  I have reviewed the patient's chart and labs.  Questions were answered to the patient's satisfaction.     Corrin Hingle V
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
## 2014-09-19 NOTE — Progress Notes (Signed)
Patient Demographics  Tyler Burgess, is a 59 y.o. male, DOB - 05-Aug-1955, ZOX:[id number]  Admit date - 09/15/2014   Admitting Physician Therisa Doyne, MD  Outpatient Primary MD for the patient is No PCP Per Patient  LOS - 4   Chief Complaint  Patient presents with  . Medical Clearance  . Suicidal  . Homicidal        Subjective:   Tyler Burgess today has, No headache, No chest pain, No abdominal pain - No Nausea, No new weakness tingling or numbness, No Cough - SOB. Currently not suicidal or homicidal but at times he does get sad. Some discomfort in the right foot.  Assessment & Plan    1.DKA in a DM 2 patient due to non compliance - counseled, check A1c, DKA resolved, place on Lantus + ISS.  Lab Results  Component Value Date   HGBA1C 13.0* 09/16/2014   CBG (last 3)   Recent Labs  09/18/14 2114 09/19/14 0733 09/19/14 1129  GLUCAP 240* 151* 237*     2.Osetomylitis right foot - post recent amputation of right first and second bit toe at [facility] by Dr. Marlyne Beards, he subsequently did not take his antibiotics as he was supposed to. Now there is evidence of some pus coming out and possible cellulitis and infection. I have switched his antibiotics to vancomycin and cefepime as he is diabetic. Flagyl also added which will cover for his recent C. difficile. Dr. Lajoyce Corners has been consulted.  Due for right transmetatarsal foot amputation later this week. He will be a moderate risk candidate for adverse cardiopulmonary outcome during the perioperative period. Preoperative EKG unremarkable, no history of CAD, he can walk multiple blocks up to a mile without any chest discomfort or shortness of breath. No history of bleeding complication with previous surgery.   3. Suicidal and homicidal ideations.  Currently resolved. Psych has been consulted, discussed the case with the psychiatrist sitter was removed by the psychiatrist on 09/19/2014 without any problems. He is currently not suicidal.    4. C. Difficile recent. Stool is now solid, discontinue contact precaution, continue Flagyl for now should help with his diabetic foot infection and provide some C. difficile protection.     Code Status: Full  Family Communication: None  Disposition Plan: [facility] versus rehabilitation   Consults  Dr Lajoyce Corners   Procedures  he is due for right transmetatarsal amputation later this week by Dr. Lajoyce Corners   DVT Prophylaxis  Lovenox    Lab Results  Component Value Date   PLT 162 09/18/2014    Medications  Scheduled Meds: . ARIPiprazole  5 mg Oral BID  . benztropine  0.5 mg Oral BID  . ceFEPime (MAXIPIME) IV  1 g Intravenous 3 times per day  . enoxaparin (LOVENOX) injection  40 mg Subcutaneous Q24H  . gabapentin  200 mg Oral TID  . insulin aspart  0-15 Units Subcutaneous TID WC  . insulin aspart  0-5 Units Subcutaneous QHS  . insulin glargine  20 Units Subcutaneous QHS  . metronidazole  500 mg Intravenous 3 times per day  . potassium chloride  20 mEq Oral Once  . saccharomyces boulardii  250 mg Oral BID  . sertraline  100 mg Oral Daily  .  sodium chloride  3 mL Intravenous Q12H  . traZODone  100 mg Oral QHS  . vancomycin  125 mg Oral 4 times per day  . vancomycin  1,000 mg Intravenous BID   Continuous Infusions:  PRN Meds:.acetaminophen **OR** [DISCONTINUED] acetaminophen, dextrose, ondansetron **OR** [DISCONTINUED] ondansetron (ZOFRAN) IV, oxyCODONE  Antibiotics     Anti-infectives    Start     Dose/Rate Route Frequency Ordered Stop   09/16/14 1500  ceFEPIme (MAXIPIME) 1 g in dextrose 5 % 50 mL IVPB     1 g 100 mL/hr over 30 Minutes Intravenous 3 times per day 09/16/14 1300     09/16/14 1400  metroNIDAZOLE (FLAGYL) IVPB 500 mg     500 mg 100 mL/hr over 60 Minutes Intravenous 3 times  per day 09/16/14 1219     09/16/14 1400  vancomycin (VANCOCIN) IVPB 1000 mg/200 mL premix     1,000 mg 200 mL/hr over 60 Minutes Intravenous 2 times daily 09/16/14 1300     09/16/14 0000  vancomycin (VANCOCIN) 50 mg/mL oral solution 125 mg     125 mg Oral 4 times per day 09/15/14 2101     09/15/14 2200  doxycycline (VIBRA-TABS) tablet 100 mg  Status:  Discontinued     100 mg Oral Every 12 hours 09/15/14 2101 09/16/14 1219        Objective:   Filed Vitals:   09/18/14 1755 09/18/14 2118 09/19/14 0227 09/19/14 0800  BP: 123/76 132/84 114/72 125/89  Pulse: 66 59 69   Temp: 98.4 F (36.9 C) 98.4 F (36.9 C)  97.6 F (36.4 C)  TempSrc: Oral Oral  Oral  Resp: 18 18 18 17   Height:      Weight:      SpO2: 100% 100% 100% 100%    Wt Readings from Last 3 Encounters:  09/16/14 74.844 kg (165 lb)  09/03/14 85.73 kg (189 lb)  07/24/14 74.844 kg (165 lb)     Intake/Output Summary (Last 24 hours) at 09/19/14 1151 Last data filed at 09/19/14 0934  Gross per 24 hour  Intake   1580 ml  Output   3150 ml  Net  -1570 ml     Physical Exam  Awake Alert, Oriented X 3, No new F.N deficits, Normal affect [place].AT,PERRAL Supple Neck,No JVD, No cervical lymphadenopathy appriciated.  Symmetrical Chest wall movement, Good air movement bilaterally, CTAB RRR,No Gallops,Rubs or new Murmurs, No Parasternal Heave +ve B.Sounds, Abd Soft, No tenderness, No organomegaly appriciated, No rebound - guarding or rigidity. No Cyanosis, Clubbing or edema, No new Rash or bruise  Right first and second baked 2 amputated, post op site still has sutures with some pus oozing out, minimal swelling and cellulitis.   Data Review   Micro Results No results found for this or any previous visit (from the past 240 hour(s)).  Radiology Reports    Dg Foot 2 Views Right  09/16/2014   CLINICAL DATA:  Osteomyelitis.  EXAM: RIGHT FOOT - 2 VIEW  COMPARISON:  08/31/2014  FINDINGS: Interval resection of the first and  second toe. There is now cortical irregularity of the distal first metatarsal consistent with osteomyelitis. Soft tissue air noted in the surgical bed about the second digit. No additional site concerning for osteomyelitis.  IMPRESSION: Post resection of the first and second toes. 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   Electronically Signed   By: Rubye OaksMelanie  Ehinger M.D.   On: 09/16/2014 02:48  CBC  Recent Labs Lab 09/15/14 1647 09/15/14 2120 09/16/14 0522 09/17/14 0523 09/18/14 0526  WBC 11.1* 11.2* 8.9 9.7 7.2  HGB 11.3* 9.5* 9.7* 10.3* 10.7*  HCT 37.2* 27.8* 28.1* 30.6* 33.3*  PLT 257 214 184 188 162  MCV 102.8* 92.1 93.0 94.4 96.2  MCH 31.2 31.5 32.1 31.8 30.9  MCHC 30.4 34.2 34.5 33.7 32.1  RDW 13.3 12.9 12.9 13.0 13.4    Chemistries   Recent Labs Lab 09/15/14 1647  09/16/14 0522 09/16/14 0934 09/17/14 0523 09/18/14 0526 09/18/14 0845 09/19/14 0525  NA 126*  < > 134* 133* 137 136  --  136  K 4.2  < > 3.5 3.5 4.0 4.8  --  3.9  CL 90*  < > 102 102 104 102  --  101  CO2 20*  < > --  29  GLUCOSE 1016*  < > 228* 253* 131* 151*  --  180*  BUN 14  < > --  12  CREATININE 1.51*  < > 0.89 0.86 0.91 1.02 1.01 1.00  CALCIUM 7.9*  < > 7.7* 7.8* 8.3* 8.7*  --  8.6*  MG  --   --  1.5*  --  1.6*  --   --  1.8  AST 96*  --  39  --   --   --   --   --   ALT 51  --  38  --   --   --   --   --   ALKPHOS 212*  --  155*  --   --   --   --   --   BILITOT 0.3  --  0.5  --   --   --   --   --   < > = values in this interval not displayed. ------------------------------------------------------------------------------------------------------------------ estimated creatinine clearance is 84.2 mL/min (by C-G formula based on Cr of 1). ------------------------------------------------------------------------------------------------------------------  Recent Labs  09/16/14 1350  HGBA1C 13.0*    ------------------------------------------------------------------------------------------------------------------ No results for input(s): CHOL, HDL, LDLCALC, TRIG, CHOLHDL, LDLDIRECT in the last 72 hours. ------------------------------------------------------------------------------------------------------------------ No results for input(s): TSH, T4TOTAL, T3FREE, THYROIDAB in the last 72 hours.  Invalid input(s): FREET3 ------------------------------------------------------------------------------------------------------------------ No results for input(s): VITAMINB12, FOLATE, FERRITIN, TIBC, IRON, RETICCTPCT in the last 72 hours.  Coagulation profile No results for input(s): INR, PROTIME in the last 168 hours.  No results for input(s): DDIMER in the last 72 hours.  Cardiac Enzymes  Recent Labs Lab 09/15/14 2120 09/16/14 0105 09/16/14 0934  TROPONINI <0.03 <0.03 <0.03   ------------------------------------------------------------------------------------------------------------------ Invalid input(s): POCBNP   Time Spent in minutes   35   Dorthy Hustead K M.D on 09/19/2014 at 11:51 AM  Between 7am to 7pm - Pager - [phone]  After 7pm go to [email or web address] - password [facility]  [facility]   Office  [phone]

## 2014-09-19 NOTE — Progress Notes (Signed)
Inpatient Diabetes Program Recommendations  AACE/ADA: New Consensus Statement on Inpatient Glycemic Control (2013)  Target Ranges:  Prepandial:   less than 140 mg/dL      Peak postprandial:   less than 180 mg/dL (1-2 hours)      Critically ill patients:  140 - 180 mg/dL   Results for Tyler Burgess, Tyler Burgess (MRN [id number]) as of 09/19/2014 12:48  Ref. Range 09/18/2014 07:26 09/18/2014 11:23 09/18/2014 16:48 09/18/2014 21:14 09/19/2014 07:33 09/19/2014 11:29  Glucose-Capillary Latest Ref Range: 65-99 mg/dL 782126 (H) 956118 (H) 213240 (H) 240 (H) 151 (H) 237 (H)   Fasting glucose levels are fairly well controlled, indicating that the lantus dose is adequate at this time to meet basal needs, however The cbg's prior to lunch and dinner and at HS rise into the mid-upper 200's. Please consider adding Novolog meal coverage of 4 units tidwc. Recommendations from 08/19/14 address using 70/30 dosing while here. If not using 70/30 pt needs the meal coverage in addition to the basal and correction scales presently ordered. Inpatient Diabetes Program Recommendations Insulin - Basal: Pt's total daily dose of insulin according to what was prescribed prior to hospitalization (but unable to afford) was 24 units lantus and a total of 9 units novolog per day (3 units meal coverage tidwc) Total daily dose insulin for both basal and meal coverage is 33 units per day. If using 70/30 pt would need 16 units bid..  Correction (SSI): xxxxxxxxxxxxx Insulin - Meal Coverage: If not ordering the 70/30, please add some meal coverge of 4 units tidwc in addition to the correction scales already ordered.  Thank you Lenor CoffinAnn Robertt Buda, RN, MSN, CDE  Diabetes Inpatient Program Office: [phone] Pager: [phone] 8:00 am to 5:00 pm

## 2014-09-19 NOTE — Progress Notes (Signed)
Patient ID: Tyler Burgess, male   DOB: 05/25/1955, 59 y.o.   MRN: [id number] Patient ate lunch today. Surgery canceled for this evening. We'll plan for surgery tomorrow at 5 PM. Discussed with the patient the importance of not eating or drinking after midnight tonight. 

## 2014-09-19 NOTE — Anesthesia Preprocedure Evaluation (Deleted)
Anesthesia Evaluation    Airway        Dental   Pulmonary Current Smoker,          Cardiovascular     Neuro/Psych Depression Bipolar Disorder Diabetic peripheral neuropathy    GI/Hepatic (+)     substance abuse  alcohol use,   Endo/Other  diabetes, Insulin Dependent  Renal/GU      Musculoskeletal   Abdominal   Peds  Hematology   Anesthesia Other Findings   Reproductive/Obstetrics                            Anesthesia Physical Anesthesia Plan Anesthesia Quick Evaluation

## 2014-09-19 NOTE — Progress Notes (Signed)
OT Cancellation Note  Patient Details Name: Jonavan Adames MRN: [name] DOB: 07/20/1955   Cancelled Treatment:    Reason Eval/Treat Not Completed: Other (comment) Mote pt for surgery today. Will check back for post op orders/eval.  Lennox LaityStone, Shravan Salahuddin Stafford  [phone] 09/19/2014, 8:30 AM
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
## 2014-09-20 ENCOUNTER — Inpatient Hospital Stay (HOSPITAL_COMMUNITY): Payer: Medicaid Other | Admitting: Anesthesiology

## 2014-09-20 ENCOUNTER — Encounter (HOSPITAL_COMMUNITY)
Admission: EM | Disposition: A | Discharge status: Skilled Nursing Facility | Payer: Self-pay | Source: Home / Self Care | Attending: Internal Medicine

## 2014-09-20 ENCOUNTER — Encounter (HOSPITAL_COMMUNITY): Payer: Self-pay | Admitting: Certified Registered Nurse Anesthetist

## 2014-09-20 HISTORY — PX: AMPUTATION: SHX166

## 2014-09-20 LAB — GLUCOSE, CAPILLARY
GLUCOSE-CAPILLARY: 190 mg/dL — AB (ref 65–99)
GLUCOSE-CAPILLARY: 84 mg/dL (ref 65–99)
GLUCOSE-CAPILLARY: 199 mg/dL — AB (ref 65–99)
Glucose-Capillary: 113 mg/dL — ABNORMAL HIGH (ref 65–99)
Glucose-Capillary: 338 mg/dL — ABNORMAL HIGH (ref 65–99)

## 2014-09-20 LAB — SURGICAL PCR SCREEN
MRSA, PCR: NEGATIVE
Staphylococcus aureus: NEGATIVE

## 2014-09-20 SURGERY — AMPUTATION, FOOT, RAY
Anesthesia: Monitor Anesthesia Care | Site: Foot | Laterality: Right

## 2014-09-20 MED ORDER — ACETAMINOPHEN 650 MG RE SUPP: 650.0000 mg | Freq: Four times a day (QID) | RECTAL | Status: DC | PRN

## 2014-09-20 MED ORDER — METOCLOPRAMIDE HCL 5 MG/ML IJ SOLN: 5.0000 mg | Freq: Three times a day (TID) | INTRAMUSCULAR | Status: DC | PRN

## 2014-09-20 MED ORDER — LACTATED RINGERS IV SOLN
INTRAVENOUS | Status: DC | PRN
  Administered 2014-09-20: 16:00:00 via INTRAVENOUS

## 2014-09-20 MED ORDER — ONDANSETRON HCL 4 MG/2ML IJ SOLN
INTRAMUSCULAR | Status: AC
  Filled 2014-09-20: qty 2

## 2014-09-20 MED ORDER — ONDANSETRON HCL 4 MG/2ML IJ SOLN: 4.0000 mg | Freq: Four times a day (QID) | INTRAMUSCULAR | Status: DC | PRN

## 2014-09-20 MED ORDER — BUPIVACAINE-EPINEPHRINE (PF) 0.5% -1:200000 IJ SOLN
INTRAMUSCULAR | Status: DC | PRN
  Administered 2014-09-20: 30 mL via PERINEURAL

## 2014-09-20 MED ORDER — LIDOCAINE HCL (CARDIAC) 20 MG/ML IV SOLN
INTRAVENOUS | Status: AC
  Filled 2014-09-20: qty 5

## 2014-09-20 MED ORDER — INSULIN ASPART 100 UNIT/ML ~~LOC~~ SOLN
0.0000 [IU] | SUBCUTANEOUS | Status: DC
  Administered 2014-09-20: 3 [IU] via SUBCUTANEOUS
  Administered 2014-09-21: 11 [IU] via SUBCUTANEOUS

## 2014-09-20 MED ORDER — LIDOCAINE HCL (CARDIAC) 20 MG/ML IV SOLN
INTRAVENOUS | Status: DC | PRN
  Administered 2014-09-20: 100 mg via INTRAVENOUS

## 2014-09-20 MED ORDER — ONDANSETRON HCL 4 MG/2ML IJ SOLN
INTRAMUSCULAR | Status: DC | PRN
Start: 2014-09-20 — End: 2014-09-20
  Administered 2014-09-20: 4 mg via INTRAVENOUS

## 2014-09-20 MED ORDER — FENTANYL CITRATE (PF) 100 MCG/2ML IJ SOLN
INTRAMUSCULAR | Status: AC
  Filled 2014-09-20: qty 2

## 2014-09-20 MED ORDER — BUPIVACAINE HCL (PF) 0.5 % IJ SOLN
INTRAMUSCULAR | Status: AC
  Filled 2014-09-20: qty 30

## 2014-09-20 MED ORDER — DEXTROSE-NACL 5-0.45 % IV SOLN
INTRAVENOUS | Status: AC
  Administered 2014-09-20: 10:00:00 via INTRAVENOUS

## 2014-09-20 MED ORDER — ONDANSETRON HCL 4 MG PO TABS: 4.0000 mg | ORAL_TABLET | Freq: Four times a day (QID) | ORAL | Status: DC | PRN

## 2014-09-20 MED ORDER — HYDROMORPHONE HCL 1 MG/ML IJ SOLN: 0.2500 mg | INTRAMUSCULAR | Status: DC | PRN

## 2014-09-20 MED ORDER — CHLORHEXIDINE GLUCONATE 4 % EX LIQD
60.0000 mL | Freq: Once | CUTANEOUS | Status: AC
  Administered 2014-09-20: 4 via TOPICAL
  Filled 2014-09-20: qty 60

## 2014-09-20 MED ORDER — MIDAZOLAM HCL 2 MG/2ML IJ SOLN
INTRAMUSCULAR | Status: AC
  Filled 2014-09-20: qty 2

## 2014-09-20 MED ORDER — METOCLOPRAMIDE HCL 10 MG PO TABS: 5.0000 mg | ORAL_TABLET | Freq: Three times a day (TID) | ORAL | Status: DC | PRN

## 2014-09-20 MED ORDER — 0.9 % SODIUM CHLORIDE (POUR BTL) OPTIME
TOPICAL | Status: DC | PRN
  Administered 2014-09-20: 1000 mL

## 2014-09-20 MED ORDER — HYDRALAZINE HCL 20 MG/ML IJ SOLN: 10.0000 mg | Freq: Four times a day (QID) | INTRAMUSCULAR | Status: DC | PRN

## 2014-09-20 MED ORDER — HYDROMORPHONE HCL 1 MG/ML IJ SOLN
1.0000 mg | INTRAMUSCULAR | Status: DC | PRN
  Administered 2014-09-21 – 2014-09-22 (×2): 1 mg via INTRAVENOUS
  Filled 2014-09-20 (×2): qty 1

## 2014-09-20 MED ORDER — PROPOFOL INFUSION 10 MG/ML OPTIME
INTRAVENOUS | Status: DC | PRN
  Administered 2014-09-20: 100 ug/kg/min via INTRAVENOUS

## 2014-09-20 MED ORDER — METHOCARBAMOL 500 MG PO TABS: 500.0000 mg | ORAL_TABLET | Freq: Four times a day (QID) | ORAL | Status: DC | PRN

## 2014-09-20 MED ORDER — SODIUM CHLORIDE 0.9 % IV SOLN
INTRAVENOUS | Status: DC
  Administered 2014-09-20 – 2014-09-25 (×3): via INTRAVENOUS

## 2014-09-20 MED ORDER — SODIUM CHLORIDE 0.45 % IV SOLN
INTRAVENOUS | Status: DC
  Administered 2014-09-20: 08:00:00 via INTRAVENOUS

## 2014-09-20 MED ORDER — EPHEDRINE SULFATE 50 MG/ML IJ SOLN
INTRAMUSCULAR | Status: DC | PRN
  Administered 2014-09-20 (×2): 5 mg via INTRAVENOUS

## 2014-09-20 MED ORDER — ACETAMINOPHEN 325 MG PO TABS
650.0000 mg | ORAL_TABLET | Freq: Four times a day (QID) | ORAL | Status: DC | PRN
  Administered 2014-09-24: 650 mg via ORAL
  Filled 2014-09-20 (×2): qty 2

## 2014-09-20 MED ORDER — PROPOFOL 10 MG/ML IV BOLUS
INTRAVENOUS | Status: AC
  Filled 2014-09-20: qty 20

## 2014-09-20 MED ORDER — MIDAZOLAM HCL 5 MG/5ML IJ SOLN
INTRAMUSCULAR | Status: DC | PRN
  Administered 2014-09-20 (×2): 1 mg via INTRAVENOUS

## 2014-09-20 MED ORDER — BUPIVACAINE-EPINEPHRINE (PF) 0.5% -1:200000 IJ SOLN
INTRAMUSCULAR | Status: AC
  Filled 2014-09-20: qty 30

## 2014-09-20 MED ORDER — BUPIVACAINE HCL (PF) 0.5 % IJ SOLN
INTRAMUSCULAR | Status: DC | PRN
  Administered 2014-09-20: 10 mL

## 2014-09-20 MED ORDER — METHOCARBAMOL 1000 MG/10ML IJ SOLN: 500.0000 mg | Freq: Four times a day (QID) | INTRAVENOUS | Status: DC | PRN

## 2014-09-20 SURGICAL SUPPLY — 37 items
BAG ZIPLOCK 12X15 (MISCELLANEOUS) ×2 IMPLANT
BANDAGE ESMARK 6X9 LF (GAUZE/BANDAGES/DRESSINGS) IMPLANT
BLADE SAW SAG 73X25 THK (BLADE) ×1
BLADE SAW SGTL 73X25 THK (BLADE) ×1 IMPLANT
BNDG COHESIVE 3X5 TAN STRL LF (GAUZE/BANDAGES/DRESSINGS) IMPLANT
BNDG COHESIVE 4X5 TAN STRL (GAUZE/BANDAGES/DRESSINGS) ×2 IMPLANT
BNDG COHESIVE 6X5 TAN STRL LF (GAUZE/BANDAGES/DRESSINGS) IMPLANT
BNDG ESMARK 6X9 LF (GAUZE/BANDAGES/DRESSINGS)
BNDG GAUZE ELAST 4 BULKY (GAUZE/BANDAGES/DRESSINGS) ×2 IMPLANT
CUFF TOURN SGL QUICK 34 (TOURNIQUET CUFF) ×1
CUFF TRNQT CYL 34X4X40X1 (TOURNIQUET CUFF) ×1 IMPLANT
DRAPE SHEET LG 3/4 BI-LAMINATE (DRAPES) IMPLANT
DRAPE SURG 17X11 SM STRL (DRAPES) IMPLANT
DRAPE U-SHAPE 47X51 STRL (DRAPES) ×2 IMPLANT
DRSG ADAPTIC 3X8 NADH LF (GAUZE/BANDAGES/DRESSINGS) IMPLANT
DRSG EMULSION OIL 3X16 NADH (GAUZE/BANDAGES/DRESSINGS) ×2 IMPLANT
DURAPREP 26ML APPLICATOR (WOUND CARE) ×2 IMPLANT
ELECT REM PT RETURN 9FT ADLT (ELECTROSURGICAL) ×2
ELECTRODE REM PT RTRN 9FT ADLT (ELECTROSURGICAL) ×1 IMPLANT
GAUZE SPONGE 4X4 12PLY STRL (GAUZE/BANDAGES/DRESSINGS) ×2 IMPLANT
GLOVE BIOGEL PI IND STRL 8.5 (GLOVE) ×1 IMPLANT
GLOVE BIOGEL PI INDICATOR 8.5 (GLOVE) ×1
GLOVE SURG ORTHO 9.0 STRL STRW (GLOVE) ×2 IMPLANT
GOWN STRL REUS W/ TWL XL LVL3 (GOWN DISPOSABLE) ×1 IMPLANT
GOWN STRL REUS W/TWL XL LVL3 (GOWN DISPOSABLE) ×1
KIT BASIN OR (CUSTOM PROCEDURE TRAY) ×2 IMPLANT
MANIFOLD NEPTUNE II (INSTRUMENTS) ×2 IMPLANT
NS IRRIG 1000ML POUR BTL (IV SOLUTION) ×2 IMPLANT
PACK TOTAL JOINT (CUSTOM PROCEDURE TRAY) IMPLANT
PAD ABD 8X10 STRL (GAUZE/BANDAGES/DRESSINGS) ×2 IMPLANT
PAD CAST 4YDX4 CTTN HI CHSV (CAST SUPPLIES) IMPLANT
PADDING CAST COTTON 4X4 STRL (CAST SUPPLIES)
POSITIONER SURGICAL ARM (MISCELLANEOUS) ×4 IMPLANT
STOCKINETTE 8 INCH (MISCELLANEOUS) IMPLANT
SUCTION FRAZIER TIP 10 FR DISP (SUCTIONS) IMPLANT
SUT ETHILON 2 0 PSLX (SUTURE) ×4 IMPLANT
WATER STERILE IRR 1500ML POUR (IV SOLUTION) IMPLANT

## 2014-09-20 NOTE — Anesthesia Procedure Notes (Addendum)
Anesthesia Regional Block:  Popliteal block  Pre-Anesthetic Checklist: ,, timeout performed, Correct Patient, Correct Site, Correct Laterality, Correct Procedure, Correct Position, site marked, Risks and benefits discussed, pre-op evaluation, post-op pain management  Laterality: Right  Prep: Maximum Sterile Barrier Precautions used and chloraprep       Needles:  Injection technique: Single-shot  Needle Type: Echogenic Stimulator Needle     Needle Length: 9cm 9 cm Needle Gauge: 21 and 21 G    Additional Needles:  Procedures: ultrasound guided (picture in chart) and nerve stimulator Popliteal block  Nerve Stimulator or Paresthesia:  Response: Peroneal,  Response: Tibial,   Additional Responses:   Narrative:  Start time: 09/20/2014 4:35 PM End time: 09/20/2014 4:48 PM Injection made incrementally with aspirations every 5 mL. Anesthesiologist: Gaynelle AduFITZGERALD, Lorielle Boehning  Additional Notes: 2% Lidocaine skin wheel. Saphenous block with 10cc of 0.5% Bupivicaine plain.

## 2014-09-20 NOTE — Progress Notes (Signed)
CSW met with patient who is sitting up in bed; pleasant and in good spirits- he is awaiting surgery and at this time he is anxious to get it over. "I'm a little worried and I don't want anymore surgeries after this". Patient is hopeful and anticipating [facility] stay after surgery- "I need to get some help". He plans to dc to a local shelter once released and "I want to get linked with housing and other things- I can't live in my truck any longer".  CSW has made referral to [facility] and will await further medical clearance post operatively.  Eduard Clos, MSW, [place]

## 2014-09-20 NOTE — Transfer of Care (Signed)
Immediate Anesthesia Transfer of Care Note  Patient: Tyler Burgess  Procedure(s) Performed: Procedure(s): RIGHT TRANSMETATARSAL AMPUTATION (Right)  Patient Location: PACU  Anesthesia Type:MAC and Regional  Level of Consciousness:  sedated, patient cooperative and responds to stimulation  Airway & Oxygen Therapy:Patient Spontanous Breathing and Patient connected to face mask oxgen  Post-op Assessment:  Report given to PACU RN and Post -op Vital signs reviewed and stable  Post vital signs:  Reviewed and stable  Last Vitals:  Filed Vitals:   09/20/14 1318  BP: 127/80  Pulse: 57  Temp: 36.9 C  Resp: 20    Complications: No apparent anesthesia complications

## 2014-09-20 NOTE — Op Note (Signed)
09/15/2014 - 09/20/2014  6:01 PM  PATIENT:  Tyler LoganBilly Burgess    PRE-OPERATIVE DIAGNOSIS:  Osteomyelitis Right Foot  POST-OPERATIVE DIAGNOSIS:  Same  PROCEDURE:  RIGHT TRANSMETATARSAL AMPUTATION  SURGEON:  Nadara MustardUDA,Laiken Nohr V, MD  PHYSICIAN ASSISTANT:None ANESTHESIA:   General  PREOPERATIVE INDICATIONS:  Tyler LoganBilly Burgess is a  59 y.o. male with a diagnosis of Osteomyelitis Right Foot who failed conservative measures and elected for surgical management.    The risks benefits and alternatives were discussed with the patient preoperatively including but not limited to the risks of infection, bleeding, nerve injury, cardiopulmonary complications, the need for revision surgery, among others, and the patient was willing to proceed.  OPERATIVE IMPLANTS: none   OPERATIVE FINDINGS: calcified vessels  OPERATIVE PROCEDURE: Patient was brought to the operating room after undergoing an ankle block. After adequate levels of anesthesia were obtained patient's right lower extremity was prepped using DuraPrep draped into a sterile field. A timeout was called. A fishmouth incision was made through the forefoot. This was carried down to the metatarsals. A transmetatarsal amputation was performed. Electrocautery was used for hemostasis. The incision was closed using 2-0 nylon. Wound was covered with a sterile compressive dressing. Patient was taken to the PACU in stable condition. There was no signs of abscess or deep infection at the surgical site.

## 2014-09-20 NOTE — H&P (View-Only) (Signed)
Patient ID: Tyler LoganBilly Burgess, male   DOB: 07/24/1955, 59 y.o.   MRN: [id number] Patient ate lunch today. Surgery canceled for this evening. We'll plan for surgery tomorrow at 5 PM. Discussed with the patient the importance of not eating or drinking after midnight tonight.
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
## 2014-09-20 NOTE — Anesthesia Postprocedure Evaluation (Signed)
  Anesthesia Post-op Note  Patient: Tyler Burgess  Procedure(s) Performed: Procedure(s): RIGHT TRANSMETATARSAL AMPUTATION (Right)  Patient Location: PACU  Anesthesia Type: MAC w/ block  Level of Consciousness: awake and alert   Airway and Oxygen Therapy: Patient Spontanous Breathing  Post-op Pain: none  Post-op Assessment: Post-op Vital signs reviewed, Patient's Cardiovascular Status Stable and Respiratory Function Stable  Post-op Vital Signs: Reviewed  Filed Vitals:   09/20/14 1809  BP: 127/68  Pulse: 60  Temp: 36.5 C  Resp: 13    Complications: No apparent anesthesia complications

## 2014-09-20 NOTE — Hospital Discharge Follow-Up (Signed)
Transitional Care Clinic:   This RN met with patient at bedside to provide information and discuss services available at the [facility]. Informed patient that Transitional Care Clinic (located at [facility]) can provide close post-discharge follow-up, onsite pharmacy and pharmacy resources, Financial services, and Social Work. Patient is homeless, uninsured, and does not have a PCP. Patient interested in applying for disability but indicates he was denied because more medical information needed.  Thoroughly discussed importance of close discharge follow-up and establishing care with PCP.  Patient indicates he has been told he may discharge to [facility] and is agreeable to this plan.  Patient indicates if he does not go to [facility] upon discharge he would consider post-discharge follow-up at the [facility] and staying in a shelter while recovering.  Also discussed that if he does transfer to [facility] he would be able to establish care with PCP at [facility] after discharge and utilize pharmacy, Charity fundraiser, and Social Work. Patient verbalized understanding. Following patient's progress and discharge planning closely.

## 2014-09-20 NOTE — Anesthesia Preprocedure Evaluation (Addendum)
Anesthesia Evaluation  Patient identified by MRN, date of birth, ID band Patient awake    Reviewed: Allergy & Precautions, H&P , NPO status , Patient's Chart, lab work & pertinent test results  Airway Mallampati: I  TM Distance: >3 FB Neck ROM: Full    Dental no notable dental hx. (+) Upper Dentures, Edentulous Lower, Dental Advisory Given   Pulmonary Current Smoker,  breath sounds clear to auscultation  Pulmonary exam normal       Cardiovascular negative cardio ROS  Rhythm:Regular Rate:Normal     Neuro/Psych Depression Bipolar Disorder negative neurological ROS     GI/Hepatic negative GI ROS, Neg liver ROS,   Endo/Other  diabetes, Type 1, Insulin Dependent  Renal/GU negative Renal ROS  negative genitourinary   Musculoskeletal   Abdominal   Peds  Hematology negative hematology ROS (+)   Anesthesia Other Findings   Reproductive/Obstetrics negative OB ROS                            Anesthesia Physical Anesthesia Plan  ASA: III  Anesthesia Plan: MAC and Regional   Post-op Pain Management:    Induction: Intravenous  Airway Management Planned: Simple Face Mask  Additional Equipment:   Intra-op Plan:   Post-operative Plan:   Informed Consent: I have reviewed the patients History and Physical, chart, labs and discussed the procedure including the risks, benefits and alternatives for the proposed anesthesia with the patient or authorized representative who has indicated his/her understanding and acceptance.   Dental advisory given  Plan Discussed with: CRNA  Anesthesia Plan Comments:         Anesthesia Quick Evaluation

## 2014-09-20 NOTE — Progress Notes (Signed)
Patient Demographics  Tyler Burgess, is a 59 y.o. male, DOB - 1955/06/13, ZOX:[id number]  Admit date - 09/15/2014   Admitting Physician Therisa Doyne, MD  Outpatient Primary MD for the patient is No PCP Per Patient  LOS - 5   Chief Complaint  Patient presents with  . Medical Clearance  . Suicidal  . Homicidal        Subjective:   Tyler Burgess today has, No headache, No chest pain, No abdominal pain - No Nausea, No new weakness tingling or numbness, No Cough - SOB. Currently not suicidal or homicidal but at times he does get sad. Today no pain in the right foot.  Assessment & Plan    1.DKA in a DM 2 patient due to non compliance - counseled, check A1c, DKA resolved, place on Lantus + ISS.   For now every 4 hours Accu-Cheks while he is nothing by mouth for surgery with low-dose D5W to avoid hypoglycemia.  Lab Results  Component Value Date   HGBA1C 13.0* 09/16/2014   CBG (last 3)   Recent Labs  09/19/14 1718 09/19/14 2153 09/20/14 0720  GLUCAP 274* 177* 190*     2.Osetomylitis right foot - post recent amputation of right first and second bit toe at [facility] by Dr. Marlyne Beards, he subsequently did not take his antibiotics as he was supposed to. Now there is evidence of some pus coming out and possible cellulitis and infection. I have switched his antibiotics to vancomycin and cefepime as he is diabetic. Flagyl also added which will cover for his recent C. difficile. Dr. Lajoyce Corners has been consulted.  Due for right transmetatarsal foot amputation later on 09/20/2014. He will be a moderate risk candidate for adverse cardiopulmonary outcome during the perioperative period. Preoperative EKG unremarkable, no history of CAD, he can walk multiple blocks up to a mile without any chest discomfort or  shortness of breath. No history of bleeding complication with previous surgery.   3. Suicidal and homicidal ideations. Currently resolved. Psych has been consulted, discussed the case with the psychiatrist sitter was removed by the psychiatrist on 09/19/2014 without any problems. He is currently not suicidal.    4. C. Difficile recent. Stool is now solid, discontinue contact precaution, continue Flagyl for now should help with his diabetic foot infection and provide some C. difficile protection.     Code Status: Full  Family Communication: None  Disposition Plan: [facility] versus rehabilitation   Consults  Dr Lajoyce Corners   Procedures  he is due for right transmetatarsal amputation on 09/20/2014 by Dr. Lajoyce Corners   DVT Prophylaxis  Lovenox    Lab Results  Component Value Date   PLT 162 09/18/2014    Medications  Scheduled Meds: . ARIPiprazole  5 mg Oral BID  . benztropine  0.5 mg Oral BID  . ceFEPime (MAXIPIME) IV  1 g Intravenous 3 times per day  . enoxaparin (LOVENOX) injection  40 mg Subcutaneous Q24H  . gabapentin  200 mg Oral TID  . insulin aspart  0-15 Units Subcutaneous Q4H  . insulin glargine  20 Units Subcutaneous QHS  . metronidazole  500 mg Intravenous 3 times per day  . potassium chloride  20 mEq Oral Once  . saccharomyces boulardii  250 mg  Oral BID  . sertraline  100 mg Oral Daily  . sodium chloride  3 mL Intravenous Q12H  . traZODone  100 mg Oral QHS  . vancomycin  125 mg Oral 4 times per day  . vancomycin  1,000 mg Intravenous BID   Continuous Infusions: . dextrose 5 % and 0.45% NaCl     PRN Meds:.acetaminophen **OR** [DISCONTINUED] acetaminophen, dextrose, ondansetron **OR** [DISCONTINUED] ondansetron (ZOFRAN) IV, oxyCODONE  Antibiotics     Anti-infectives    Start     Dose/Rate Route Frequency Ordered Stop   09/16/14 1500  ceFEPIme (MAXIPIME) 1 g in dextrose 5 % 50 mL IVPB     1 g 100 mL/hr over 30 Minutes Intravenous 3 times per day 09/16/14 1300      09/16/14 1400  metroNIDAZOLE (FLAGYL) IVPB 500 mg     500 mg 100 mL/hr over 60 Minutes Intravenous 3 times per day 09/16/14 1219     09/16/14 1400  vancomycin (VANCOCIN) IVPB 1000 mg/200 mL premix     1,000 mg 200 mL/hr over 60 Minutes Intravenous 2 times daily 09/16/14 1300     09/16/14 0000  vancomycin (VANCOCIN) 50 mg/mL oral solution 125 mg     125 mg Oral 4 times per day 09/15/14 2101     09/15/14 2200  doxycycline (VIBRA-TABS) tablet 100 mg  Status:  Discontinued     100 mg Oral Every 12 hours 09/15/14 2101 09/16/14 1219        Objective:   Filed Vitals:   09/19/14 1630 09/19/14 2108 09/20/14 0223 09/20/14 0517  BP: 115/72 120/79 151/82 108/64  Pulse: 55 69 55 62  Temp: 98.2 F (36.8 C) 98.6 F (37 C) 98.8 F (37.1 C) 98.3 F (36.8 C)  TempSrc: Oral Oral Oral Oral  Resp: 18 16 18 18   Height:      Weight:      SpO2: 98% 98% 100% 99%    Wt Readings from Last 3 Encounters:  09/16/14 74.844 kg (165 lb)  09/03/14 85.73 kg (189 lb)  07/24/14 74.844 kg (165 lb)     Intake/Output Summary (Last 24 hours) at 09/20/14 0938 Last data filed at 09/20/14 0834  Gross per 24 hour  Intake   1080 ml  Output   2350 ml  Net  -1270 ml     Physical Exam  Awake Alert, Oriented X 3, No new F.N deficits, Normal affect [place].AT,PERRAL Supple Neck,No JVD, No cervical lymphadenopathy appriciated.  Symmetrical Chest wall movement, Good air movement bilaterally, CTAB RRR,No Gallops,Rubs or new Murmurs, No Parasternal Heave +ve B.Sounds, Abd Soft, No tenderness, No organomegaly appriciated, No rebound - guarding or rigidity. No Cyanosis, Clubbing or edema, No new Rash or bruise  Right first and second baked 2 amputated, post op site still has sutures with some pus oozing out, minimal swelling and cellulitis.   Data Review   Micro Results Recent Results (from the past 240 hour(s))  Surgical pcr screen     Status: None   Collection Time: 09/20/14  4:43 AM  Result Value Ref Range  Status   MRSA, PCR NEGATIVE NEGATIVE Final   Staphylococcus aureus NEGATIVE NEGATIVE Final    Comment:        The Xpert SA Assay (FDA approved for NASAL specimens in patients over 57 years of age), is one component of a comprehensive surveillance program.  Test performance has been validated by [facility] for patients greater than or equal to [address]102 year old. It is not  intended to diagnose infection nor to guide or monitor treatment.     Radiology Reports    Dg Foot 2 Views Right  09/16/2014   CLINICAL DATA:  Osteomyelitis.  EXAM: RIGHT FOOT - 2 VIEW  COMPARISON:  08/31/2014  FINDINGS: Interval resection of the first and second toe. There is now cortical irregularity of the distal first metatarsal consistent with osteomyelitis. Soft tissue air noted in the surgical bed about the second digit. No additional site concerning for osteomyelitis.  IMPRESSION: Post resection of the first and second toes. 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   Electronically Signed   By: Rubye Oaks M.D.   On: 09/16/2014 02:48        CBC  Recent Labs Lab 09/15/14 1647 09/15/14 2120 09/16/14 0522 09/17/14 0523 09/18/14 0526  WBC 11.1* 11.2* 8.9 9.7 7.2  HGB 11.3* 9.5* 9.7* 10.3* 10.7*  HCT 37.2* 27.8* 28.1* 30.6* 33.3*  PLT 257 214 184 188 162  MCV 102.8* 92.1 93.0 94.4 96.2  MCH 31.2 31.5 32.1 31.8 30.9  MCHC 30.4 34.2 34.5 33.7 32.1  RDW 13.3 12.9 12.9 13.0 13.4    Chemistries   Recent Labs Lab 09/15/14 1647  09/16/14 0522 09/16/14 0934 09/17/14 0523 09/18/14 0526 09/18/14 0845 09/19/14 0525  NA 126*  < > 134* 133* 137 136  --  136  K 4.2  < > 3.5 3.5 4.0 4.8  --  3.9  CL 90*  < > 102 102 104 102  --  101  CO2 20*  < > --  29  GLUCOSE 1016*  < > 228* 253* 131* 151*  --  180*  BUN 14  < > --  12  CREATININE 1.51*  < > 0.89 0.86 0.91 1.02 1.01 1.00  CALCIUM 7.9*  < > 7.7* 7.8* 8.3* 8.7*  --  8.6*  MG  --   --  1.5*   --  1.6*  --   --  1.8  AST 96*  --  39  --   --   --   --   --   ALT 51  --  38  --   --   --   --   --   ALKPHOS 212*  --  155*  --   --   --   --   --   BILITOT 0.3  --  0.5  --   --   --   --   --   < > = values in this interval not displayed. ------------------------------------------------------------------------------------------------------------------ estimated creatinine clearance is 84.2 mL/min (by C-G formula based on Cr of 1). ------------------------------------------------------------------------------------------------------------------ No results for input(s): HGBA1C in the last 72 hours. ------------------------------------------------------------------------------------------------------------------ No results for input(s): CHOL, HDL, LDLCALC, TRIG, CHOLHDL, LDLDIRECT in the last 72 hours. ------------------------------------------------------------------------------------------------------------------ No results for input(s): TSH, T4TOTAL, T3FREE, THYROIDAB in the last 72 hours.  Invalid input(s): FREET3 ------------------------------------------------------------------------------------------------------------------ No results for input(s): VITAMINB12, FOLATE, FERRITIN, TIBC, IRON, RETICCTPCT in the last 72 hours.  Coagulation profile No results for input(s): INR, PROTIME in the last 168 hours.  No results for input(s): DDIMER in the last 72 hours.  Cardiac Enzymes  Recent Labs Lab 09/15/14 2120 09/16/14 0105 09/16/14 0934  TROPONINI <0.03 <0.03 <0.03   ------------------------------------------------------------------------------------------------------------------ Invalid input(s): POCBNP   Time Spent in minutes   35   SINGH,PRASHANT K M.D on 09/20/2014 at 9:38 AM  Between 7am to 7pm -  Pager - [phone]  After 7pm go to [email or web address] - password [facility]  [facility]   Office  [phone]

## 2014-09-20 NOTE — Progress Notes (Signed)
Spiritual care consulted with psych SW and worked with pt to complete advance directive.    Notarized by chaplain.  Pt given original and two copies.  Copy placed in paper chart and copy given to medical records to be scanned into EPIC.   Belva CromeStalnaker, Nomar Broad Wayne MDiv

## 2014-09-20 NOTE — Progress Notes (Signed)
Patient is 59 year old homeless person who lives in his truck. He reports that over the course of two years his son, his wife, his best friend and his brother in law, all the members of his major emotional support system, died. He states he is afraid to be in any type relationship because of fear of losing that person. His loneliness is a contributing factor to is psycho-social issues. He admits that after all of the deaths over a relatively short time, he dropped out of life and became homeless. 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 He admits to suicidal thoughts, most often after long periods without positive human contact. He states he will [facility] for suicidal counseling after the surgery on his foot.   IMPORTANT: Mr Lambert ModySharp has a MPOA that needs to be witnessed and notarized prior to his surgery. The chaplain is prepared to assist in this matter, but is not a notary. Please page the chaplain if the chaplain can be of assistance.  Grief counsel and active listening assisted in reducing his feelings of isolation and hopelessness. He is aware of the availability of Chaplains to provide spiritual and emotional care and support.  Spiritual Care recommended through [facility]uf[facility].  Benjie Karvonenharles D. Aariv Medlock, DMin Chaplain

## 2014-09-20 NOTE — Progress Notes (Signed)
PT Cancellation Note  Patient Details Name: Tyler Burgess MRN: [id number] DOB: 10/02/1955   Cancelled Treatment:     PT deferred this date, pt scheduled for surgery this date.  Will follow.   Duff Pozzi 09/20/2014, 4:24 PM

## 2014-09-20 NOTE — Interval H&P Note (Signed)
History and Physical Interval Note:  09/20/2014 6:49 AM  Tyler Burgess  has presented today for surgery, with the diagnosis of Osteomyelitis Right Foot  The various methods of treatment have been discussed with the patient and family. After consideration of risks, benefits and other options for treatment, the patient has consented to  Procedure(s): RIGHT TRANSMETATARSAL AMPUTATION (Right) as a surgical intervention .  The patient's history has been reviewed, patient examined, no change in status, stable for surgery.  I have reviewed the patient's chart and labs.  Questions were answered to the patient's satisfaction.     Izumi Mixon V

## 2014-09-20 NOTE — Care Management Note (Signed)
Case Management Note  Patient Details  Name: Tyler LoganBilly Burgess MRN: [id number] Date of Birth: 05/09/1955 .  Subjective/Objective:       59 yo male with osteomylitis             Action/Plan: Patient stated that the psych MD stated that [facility] from [facility] medical facility. Discussed other options with patient, if necessary at time of discharge. Patient stated that at some point, either from the hospital or [facility] he would then like to go to a shelter, but he feels that [facility]. Discussed and provided patient with information for the [facility] and [facility] for access to medical care and medications after discharge. Patient is appreciative of information provided. CM will continue to follow up for dc needs.  Expected Discharge Date:  09/18/14               Expected Discharge Plan:  Home/Self Care  In-House Referral:  Clinical Social Work  Discharge planning Services  CM Consult, Indigent Health Clinic  Post Acute Care Choice:    Choice offered to:     DME Arranged:    DME Agency:     HH Arranged:    HH Agency:     Status of Service:  In process, will continue to follow  Medicare Important Message Given:  No Date Medicare IM Given:    Medicare IM give by:    Date Additional Medicare IM Given:    Additional Medicare Important Message give by:     If discussed at Long Length of Stay Meetings, dates discussed:    Additional Comments:  [name], Payden Bonus S, RN 09/20/2014, 12:00 PM

## 2014-09-20 NOTE — Progress Notes (Signed)
OT Cancellation Note  Patient Details Name: Tyler Burgess MRN: [id number] DOB: 09/30/1955   Cancelled Treatment:    Reason Eval/Treat Not Completed: Medical issues which prohibited therapy.  Noted, pt for sx today. Will check back tomorrow.  Tyler Burgess 09/20/2014, 7:23 AM  Tyler Burgess, OTR/L [phone] 09/20/2014

## 2014-09-21 ENCOUNTER — Encounter (HOSPITAL_COMMUNITY): Payer: Self-pay | Admitting: Orthopedic Surgery

## 2014-09-21 LAB — BASIC METABOLIC PANEL WITH GFR
Anion gap: 7 (ref 5–15)
BUN: 14 mg/dL (ref 6–20)
CO2: 30 mmol/L (ref 22–32)
Calcium: 8.3 mg/dL — ABNORMAL LOW (ref 8.9–10.3)
Chloride: 100 mmol/L — ABNORMAL LOW (ref 101–111)
Creatinine, Ser: 1.2 mg/dL (ref 0.61–1.24)
GFR calc Af Amer: >60 mL/min
GFR calc non Af Amer: >60 mL/min
Glucose, Bld: 153 mg/dL — ABNORMAL HIGH (ref 65–99)
Potassium: 4.3 mmol/L (ref 3.5–5.1)
Sodium: 137 mmol/L (ref 135–145)

## 2014-09-21 LAB — CBC
HCT: 30.5 % — ABNORMAL LOW (ref 39.0–52.0)
Hemoglobin: 9.5 g/dL — ABNORMAL LOW (ref 13.0–17.0)
MCH: 30.9 pg (ref 26.0–34.0)
MCHC: 31.1 g/dL (ref 30.0–36.0)
MCV: 99.3 fL (ref 78.0–100.0)
Platelets: 169 10*3/uL (ref 150–400)
RBC: 3.07 MIL/uL — ABNORMAL LOW (ref 4.22–5.81)
RDW: 14 % (ref 11.5–15.5)
WBC: 10.4 10*3/uL (ref 4.0–10.5)

## 2014-09-21 LAB — GLUCOSE, CAPILLARY
GLUCOSE-CAPILLARY: 176 mg/dL — AB (ref 65–99)
Glucose-Capillary: 82 mg/dL (ref 65–99)
Glucose-Capillary: 66 mg/dL (ref 65–99)
Glucose-Capillary: 61 mg/dL — ABNORMAL LOW (ref 65–99)
Glucose-Capillary: 112 mg/dL — ABNORMAL HIGH (ref 65–99)
Glucose-Capillary: 118 mg/dL — ABNORMAL HIGH (ref 65–99)
Glucose-Capillary: 184 mg/dL — ABNORMAL HIGH (ref 65–99)
Glucose-Capillary: 289 mg/dL — ABNORMAL HIGH (ref 65–99)

## 2014-09-21 LAB — MAGNESIUM: MAGNESIUM: 1.6 mg/dL — AB (ref 1.7–2.4)

## 2014-09-21 MED ORDER — INSULIN ASPART 100 UNIT/ML ~~LOC~~ SOLN
0.0000 [IU] | Freq: Three times a day (TID) | SUBCUTANEOUS | Status: DC
  Administered 2014-09-21 (×2): 3 [IU] via SUBCUTANEOUS

## 2014-09-21 MED ORDER — VANCOMYCIN HCL IN DEXTROSE 750-5 MG/150ML-% IV SOLN
750.0000 mg | Freq: Two times a day (BID) | INTRAVENOUS | Status: AC
  Administered 2014-09-21 (×2): 750 mg via INTRAVENOUS
  Filled 2014-09-21 (×3): qty 150

## 2014-09-21 MED ORDER — MAGNESIUM SULFATE 2 GM/50ML IV SOLN
2.0000 g | Freq: Once | INTRAVENOUS | Status: AC
  Administered 2014-09-21: 2 g via INTRAVENOUS
  Filled 2014-09-21: qty 50

## 2014-09-21 NOTE — Progress Notes (Addendum)
ANTIBIOTIC CONSULT NOTE - Follow-Up  Pharmacy Consult for Vancomycin/Cefepime Indication: Osteomyelitis  No Known Allergies  Patient Measurements: Height:  (177.8 cm) (per pt) Weight: 165 lb (74.844 kg) (per pt) IBW/kg (Calculated) : 73   Vital Signs: Temp: 98.4 F (36.9 C) (05/27 0410) Temp Source: Oral (05/27 0410) BP: 100/53 mmHg (05/27 0410) Pulse Rate: 65 (05/27 0410) Intake/Output from previous day: 05/26 0701 - 05/27 0700 In: 1181.7 [I.V.:581.7; IV Piggyback:600] Out: 900 [Urine:900] Intake/Output from this shift:    Labs:  Recent Labs  09/18/14 0845 09/19/14 0525 09/21/14 0759  WBC  --   --  10.4  HGB  --   --  9.5*  PLT  --   --  169  CREATININE 1.01 1.00 1.20   Estimated Creatinine Clearance: 70.1 mL/min (by C-G formula based on Cr of 1.2).  Recent Labs  09/18/14 0845  VANCOTROUGH 20     Microbiology: Recent Results (from the past 720 hour(s))  Wound culture     Status: None   Collection Time: 08/31/14  8:05 AM  Result Value Ref Range Status   Specimen Description FOOT RIGHT  Final   Special Requests NONE  Final   Gram Stain   Final    RARE WBC PRESENT, PREDOMINANTLY PMN NO SQUAMOUS EPITHELIAL CELLS SEEN MODERATE GRAM POSITIVE COCCI IN PAIRS IN CLUSTERS IN CHAINS Performed at [facility]    Culture   Final    MODERATE STAPHYLOCOCCUS AUREUS Note: RIFAMPIN AND GENTAMICIN SHOULD NOT BE USED AS SINGLE DRUGS FOR TREATMENT OF STAPH INFECTIONS. Performed at [facility]    Report Status 09/03/2014 FINAL  Final   Organism ID, Bacteria STAPHYLOCOCCUS AUREUS  Final      Susceptibility   Staphylococcus aureus - MIC*    CLINDAMYCIN >=8 RESISTANT Resistant     ERYTHROMYCIN >=8 RESISTANT Resistant     GENTAMICIN <=0.5 SENSITIVE Sensitive     LEVOFLOXACIN 0.25 SENSITIVE Sensitive     OXACILLIN <=0.25 SENSITIVE Sensitive     PENICILLIN <=0.03 SENSITIVE Sensitive     RIFAMPIN <=0.5 SENSITIVE Sensitive     TRIMETH/SULFA <=10  SENSITIVE Sensitive     VANCOMYCIN <=0.5 SENSITIVE Sensitive     TETRACYCLINE <=1 SENSITIVE Sensitive     MOXIFLOXACIN <=0.25 SENSITIVE Sensitive     * MODERATE STAPHYLOCOCCUS AUREUS  Culture, blood (routine x 2)     Status: None   Collection Time: 08/31/14  8:25 AM  Result Value Ref Range Status   Specimen Description LEFT ANTECUBITAL  Final   Special Requests BOTTLES DRAWN AEROBIC AND ANAEROBIC 6CC  Final   Culture NO GROWTH 5 DAYS  Final   Report Status 09/05/2014 FINAL  Final  Culture, blood (routine x 2)     Status: None   Collection Time: 08/31/14  8:27 AM  Result Value Ref Range Status   Specimen Description BLOOD RIGHT ARM  Final   Special Requests BOTTLES DRAWN AEROBIC AND ANAEROBIC 6CC  Final   Culture NO GROWTH 5 DAYS  Final   Report Status 09/05/2014 FINAL  Final  Urine culture     Status: None   Collection Time: 08/31/14  9:05 AM  Result Value Ref Range Status   Specimen Description URINE, RANDOM  Final   Special Requests NONE  Final   Colony Count NO GROWTH Performed at [facility]   Final   Culture NO GROWTH Performed at [facility]   Final   Report Status 09/02/2014 FINAL  Final  Clostridium Difficile by PCR     Status: Abnormal   Collection Time: 09/01/14  6:15 AM  Result Value Ref Range Status   C difficile by pcr POSITIVE (A) NEGATIVE Final    Comment: CRITICAL RESULT CALLED TO, READ BACK BY AND VERIFIED WITH: Alberteen SpindleWRIGHT, M AT [id number] ON 09/01/14 BY WOODS, M    Surgical pcr screen     Status: None   Collection Time: 09/03/14  6:33 AM  Result Value Ref Range Status   MRSA, PCR NEGATIVE NEGATIVE Final   Staphylococcus aureus NEGATIVE NEGATIVE Final    Comment:        The Xpert SA Assay (FDA approved for NASAL specimens in patients over 121 years of age), is one component of a comprehensive surveillance program.  Test performance has been validated by [facility] for patients greater than or equal to [address] year old. It is not intended to  diagnose infection nor to guide or monitor treatment.   Surgical pcr screen     Status: None   Collection Time: 09/20/14  4:43 AM  Result Value Ref Range Status   MRSA, PCR NEGATIVE NEGATIVE Final   Staphylococcus aureus NEGATIVE NEGATIVE Final    Comment:        The Xpert SA Assay (FDA approved for NASAL specimens in patients over 57 years of age), is one component of a comprehensive surveillance program.  Test performance has been validated by [facility] for patients greater than or equal to [address] year old. It is not intended to diagnose infection nor to guide or monitor treatment.     Medical History: Past Medical History  Diagnosis Date  . Diabetes mellitus without complication   . Depression    Assessment: Tyler Burgess presents with SI and HI.  He had recent amputation (5/9) of the R first and second toe d/t infection/osteomyelitis.  Physician notes pus coming from site of amputation with possible surrounding cellulitis.  He states he did not take his doxycycline post-operatively. Vancomycin PO and metronidazole IV ordered for C. Difficile.  He was not taking his oral vancomycin as ordered prior to admission. Pharmacy consulted to dose IV Vancomycin and Cefepime for osteomyelitis. Patient is now s/p transmetatarsal R foot amputation on 5/26.  5/22 >> IV Vancomycin >>  5/22 >> Cefepime >>  5/22 >> Metronidazole >>  5/22 >> PO vancomycin >>  08/31/14: R foot wound: MSSA  Afebrile WBC WNL SCr increased from 1.0 to 1.2, CrCl~70 ml/min  Day #6 Vancomycin 1000mg  IV q12h and Cefepime 1gm IV q8h.  Goal of Therapy:  Vancomycin trough level 15-20 mcg/ml  Appropriate antibiotic dosing for renal function and indication Eradication of infection  Plan:  Due to bump in SCr, will reduce vancomycin to 750 mg q12h; however, consider discontinuing.  Culture wounds from 5/6 with MSSA. Consider narrowing based on these results, especially in setting of C.diff.  De-escalate to cefazolin  versus continuing current antibiotics for short course post-op if infection source has been removed?   Clance BollAmanda Angellica Maddison, PharmD, BCPS Pager: [phone] 09/21/2014 8:49 AM      Addendum: 09/21/2014 10:20 AM MD placed consult to continue current antibiotics for 1 more day and stop on 09/22/14. Plan:  Placed stop date of 5/28 on IV Vancomycin and IV Cefepime orders. Clance BollAmanda Avner Stroder, PharmD, BCPS Pager: [phone] 09/21/2014 10:21 AM

## 2014-09-21 NOTE — Progress Notes (Signed)
Patient Demographics  Tyler Burgess, is a 59 y.o. male, DOB - 25-Mar-1956, ZOX:[id number]  Admit date - 09/15/2014   Admitting Physician Therisa Doyne, MD  Outpatient Primary MD for the patient is No PCP Per Patient  LOS - 6   Chief Complaint  Patient presents with  . Medical Clearance  . Suicidal  . Homicidal        Subjective:   [name] today has, No headache, No chest pain, No abdominal pain - No Nausea, No new weakness tingling or numbness, No Cough - SOB. Currently not suicidal or homicidal but at times he does get sad. Today no pain in the right foot.  Assessment & Plan    1.DKA in a DM 2 patient due to non compliance - poor outpatient control as A1c was 13, counseled on compliance, his Lantus has been adjusted continue every before meals Accu-Cheks with sliding scale and monitor CBGs.   Lab Results  Component Value Date   HGBA1C 13.0* 09/16/2014   CBG (last 3)   Recent Labs  09/21/14 0500 09/21/14 0631 09/21/14 0716  GLUCAP 61* 118* 112*     2.Osetomylitis right foot - post recent amputation of right first and second bit toe at [facility] by Dr. Marlyne Beards, he subsequently did not take his antibiotics as he was supposed to. He developed infection again in the postoperative area, was seen by Dr. 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 Continue empiric anti-biotic for 1 more day then stop all anti-biotics on 09/22/2014. Initiate PT.   3. Suicidal and homicidal ideations. Currently resolved. Psych has been consulted, discussed the case with the psychiatrist sitter was removed by the psychiatrist on 09/19/2014 without any problems. He is currently not suicidal.    4. C. Difficile recent. Stool is now solid, discontinue contact precaution,  continue Flagyl for now should help with his diabetic foot infection and provide some C. difficile protection.     Code Status: Full  Family Communication: None  Disposition Plan: [facility] versus rehabilitation   Consults  Dr Lajoyce Corners   Procedures  post right transmetatarsal amputation on 09/20/2014 by Dr. Lajoyce Corners   DVT Prophylaxis  Lovenox    Lab Results  Component Value Date   PLT 169 09/21/2014    Medications  Scheduled Meds: . ARIPiprazole  5 mg Oral BID  . benztropine  0.5 mg Oral BID  . ceFEPime (MAXIPIME) IV  1 g Intravenous 3 times per day  . enoxaparin (LOVENOX) injection  40 mg Subcutaneous Q24H  . gabapentin  200 mg Oral TID  . insulin aspart  0-15 Units Subcutaneous TID WC  . insulin glargine  20 Units Subcutaneous QHS  . metronidazole  500 mg Intravenous 3 times per day  . potassium chloride  20 mEq Oral Once  . saccharomyces boulardii  250 mg Oral BID  . sertraline  100 mg Oral Daily  . sodium chloride  3 mL Intravenous Q12H  . traZODone  100 mg Oral QHS  . vancomycin  125 mg Oral 4 times per day  . vancomycin  750 mg Intravenous BID   Continuous Infusions: . sodium chloride 10 mL/hr at 09/20/14 2102   PRN Meds:.acetaminophen **OR** acetaminophen, acetaminophen **OR** [DISCONTINUED] acetaminophen, dextrose, hydrALAZINE, HYDROmorphone (  DILAUDID) injection, HYDROmorphone (DILAUDID) injection, [DISCONTINUED] ondansetron **OR** ondansetron (ZOFRAN) IV, oxyCODONE  Antibiotics     Anti-infectives    Start     Dose/Rate Route Frequency Ordered Stop   09/21/14 1000  vancomycin (VANCOCIN) IVPB 750 mg/150 ml premix     750 mg 150 mL/hr over 60 Minutes Intravenous 2 times daily 09/21/14 0855     09/16/14 1500  ceFEPIme (MAXIPIME) 1 g in dextrose 5 % 50 mL IVPB     1 g 100 mL/hr over 30 Minutes Intravenous 3 times per day 09/16/14 1300     09/16/14 1400  metroNIDAZOLE (FLAGYL) IVPB 500 mg     500 mg 100 mL/hr over 60 Minutes Intravenous 3 times per day 09/16/14 1219      09/16/14 1400  vancomycin (VANCOCIN) IVPB 1000 mg/200 mL premix  Status:  Discontinued     1,000 mg 200 mL/hr over 60 Minutes Intravenous 2 times daily 09/16/14 1300 09/21/14 0855   09/16/14 0000  vancomycin (VANCOCIN) 50 mg/mL oral solution 125 mg     125 mg Oral 4 times per day 09/15/14 2101     09/15/14 2200  doxycycline (VIBRA-TABS) tablet 100 mg  Status:  Discontinued     100 mg Oral Every 12 hours 09/15/14 2101 09/16/14 1219        Objective:   Filed Vitals:   09/20/14 1830 09/20/14 1842 09/20/14 2125 09/21/14 0410  BP: 132/69 133/82 114/72 100/53  Pulse: 57 64 92 65  Temp: 97.6 F (36.4 C) 97.7 F (36.5 C) 98.4 F (36.9 C) 98.4 F (36.9 C)  TempSrc:   Oral Oral  Resp: 12 16 20 16   Height:      Weight:      SpO2: 99% 100% 99% 100%    Wt Readings from Last 3 Encounters:  09/16/14 74.844 kg (165 lb)  09/03/14 85.73 kg (189 lb)  07/24/14 74.844 kg (165 lb)     Intake/Output Summary (Last 24 hours) at 09/21/14 1001 Last data filed at 09/21/14 0512  Gross per 24 hour  Intake 1181.67 ml  Output    400 ml  Net 781.67 ml     Physical Exam  Awake Alert, Oriented X 3, No new F.N deficits, Normal affect [place].AT,PERRAL Supple Neck,No JVD, No cervical lymphadenopathy appriciated.  Symmetrical Chest wall movement, Good air movement bilaterally, CTAB RRR,No Gallops,Rubs or new Murmurs, No Parasternal Heave +ve B.Sounds, Abd Soft, No tenderness, No organomegaly appriciated, No rebound - guarding or rigidity. No Cyanosis, Clubbing or edema, No new Rash or bruise  Right foot under bandage post transmetatarsal amputation.   Data Review   Micro Results Recent Results (from the past 240 hour(s))  Surgical pcr screen     Status: None   Collection Time: 09/20/14  4:43 AM  Result Value Ref Range Status   MRSA, PCR NEGATIVE NEGATIVE Final   Staphylococcus aureus NEGATIVE NEGATIVE Final    Comment:        The Xpert SA Assay (FDA approved for NASAL specimens in  patients over 57 years of age), is one component of a comprehensive surveillance program.  Test performance has been validated by [facility] for patients greater than or equal to 453 year old. It is not intended to diagnose infection nor to guide or monitor treatment.     Radiology Reports    Dg Foot 2 Views Right  09/16/2014   CLINICAL DATA:  Osteomyelitis.  EXAM: RIGHT FOOT - 2 VIEW  COMPARISON:  08/31/2014  FINDINGS: Interval resection of the first and second toe. There is now cortical irregularity of the distal first metatarsal consistent with osteomyelitis. Soft tissue air noted in the surgical bed about the second digit. No additional site concerning for osteomyelitis.  IMPRESSION: Post resection of the first and second toes. 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   Electronically Signed   By: Rubye Oaks M.D.   On: 09/16/2014 02:48        CBC  Recent Labs Lab 09/15/14 2120 09/16/14 0522 09/17/14 0523 09/18/14 0526 09/21/14 0759  WBC 11.2* 8.9 9.7 7.2 10.4  HGB 9.5* 9.7* 10.3* 10.7* 9.5*  HCT 27.8* 28.1* 30.6* 33.3* 30.5*  PLT 214 184 188 162 169  MCV 92.1 93.0 94.4 96.2 99.3  MCH 31.5 32.1 31.8 30.9 30.9  MCHC 34.2 34.5 33.7 32.1 31.1  RDW 12.9 12.9 13.0 13.4 14.0    Chemistries   Recent Labs Lab 09/15/14 1647  09/16/14 0522 09/16/14 0934 09/17/14 0523 09/18/14 0526 09/18/14 0845 09/19/14 0525 09/21/14 0759  NA 126*  < > 134* 133* 137 136  --  136 137  K 4.2  < > 3.5 3.5 4.0 4.8  --  3.9 4.3  CL 90*  < > 102 102 104 102  --  101 100*  CO2 20*  < > --  29 30  GLUCOSE 1016*  < > 228* 253* 131* 151*  --  180* 153*  BUN 14  < > --  12 14  CREATININE 1.51*  < > 0.89 0.86 0.91 1.02 1.01 1.00 1.20  CALCIUM 7.9*  < > 7.7* 7.8* 8.3* 8.7*  --  8.6* 8.3*  MG  --   --  1.5*  --  1.6*  --   --  1.8 1.6*  AST 96*  --  39  --   --   --   --   --   --   ALT 51  --  38  --   --   --   --   --   --    ALKPHOS 212*  --  155*  --   --   --   --   --   --   BILITOT 0.3  --  0.5  --   --   --   --   --   --   < > = values in this interval not displayed. ------------------------------------------------------------------------------------------------------------------ estimated creatinine clearance is 70.1 mL/min (by C-G formula based on Cr of 1.2). ------------------------------------------------------------------------------------------------------------------ No results for input(s): HGBA1C in the last 72 hours. ------------------------------------------------------------------------------------------------------------------ No results for input(s): CHOL, HDL, LDLCALC, TRIG, CHOLHDL, LDLDIRECT in the last 72 hours. ------------------------------------------------------------------------------------------------------------------ No results for input(s): TSH, T4TOTAL, T3FREE, THYROIDAB in the last 72 hours.  Invalid input(s): FREET3 ------------------------------------------------------------------------------------------------------------------ No results for input(s): VITAMINB12, FOLATE, FERRITIN, TIBC, IRON, RETICCTPCT in the last 72 hours.  Coagulation profile No results for input(s): INR, PROTIME in the last 168 hours.  No results for input(s): DDIMER in the last 72 hours.  Cardiac Enzymes  Recent Labs Lab 09/15/14 2120 09/16/14 0105 09/16/14 0934  TROPONINI <0.03 <0.03 <0.03   ------------------------------------------------------------------------------------------------------------------ Invalid input(s): POCBNP   Time Spent in minutes   35   Carver Murakami K M.D on 09/21/2014 at 10:01 AM  Between 7am to 7pm - Pager - [phone]  After 7pm go to [email or web address] - password [facility]  [facility]   Office  [phone]

## 2014-09-21 NOTE — Progress Notes (Signed)
Pt pending pt evaluation to determine pt physical needs. CSW awaiting evaluation to determine pt disposition. Per psychiatry, pt may benefit from inpatient psychiatric treatment.   Olga CoasterKristen Clemie General, LCSW  Clinical Social Work  [facility] [phone]

## 2014-09-21 NOTE — Progress Notes (Signed)
CSW referred the pt to the following hospitals to try and obtain inpatient placement :   [facility] [address]  [place], [place] [phone] ED CSW 09/21/2014 10:14 PM

## 2014-09-21 NOTE — Progress Notes (Signed)
Pt able to ambulate with walker 58 feet.  Pt referred to:  [facility]  [facility]  [facility]    Olga CoasterKristen Jaleea Alesi, [place]  Clinical Social Work  [facility] [phone]

## 2014-09-21 NOTE — Progress Notes (Signed)
Patient ID: Tyler Burgess, male   DOB: 12/30/1955, 59 y.o.   MRN: [id number] Postoperative day 1 right transmetatarsal amputation. Patient is comfortable. Patient does have some bleeding through the dressing I will have the dressing changed. Anticipate patient could be discharged to home tomorrow once he is safe with a walker without weightbearing on the right foot. I will follow-up in the office in 3 weeks.
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
## 2014-09-21 NOTE — Care Management Note (Signed)
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 Patient is currently faxed out to psychiatric facilities pending placement for safety due SI. Spoke with patient regarding follow up with PCP and discussed TCC for follow up. Patient stated that he met with the TCC representatives and is agreeable to follow up with the [facility]. Carmela Hurt, TCC CM , made aware of plan and agreeable. Spoke with Pura Spice, [facility] DME, and requested follow up regarding DME for rolling walker under the charity program. Patient has a cell phone and contact number is [phone].  Expected Discharge Date:  09/18/14               Expected Discharge Plan:  Home/Self Care  In-House Referral:  Clinical Social Work  Discharge planning Services  CM Consult, [facility]  Post Acute Care Choice:    Choice offered to:     DME Arranged:    DME Agency:     HH Arranged:    HH Agency:     Status of Service:  In process, will continue to follow  Medicare Important Message Given:  No Date Medicare IM Given:    Medicare IM give by:    Date Additional Medicare IM Given:    Additional Medicare Important Message give by:     If discussed at [place] of Stay Meetings, dates discussed:    Additional Comments:  Scot Dock, RN 09/21/2014, 2:44 PM

## 2014-09-21 NOTE — Hospital Discharge Follow-Up (Signed)
Transitional Care Clinic:  This Transitional Care Coordinator spoke with Tyler PilotAndrea Muza, RN Case Manager about patient's anticipated discharge plan.  Per Sue LushAndrea, plan at this time is placement at a psychiatric facility.  Patient has [facility] pamphlet and contact information and has been encouraged to call and obtain new patient appointment once discharged from facility to establish care with PCP.  Patient agreeable to plan.  Transitional Care Coordinator will continue to follow patient's progress and discharge planning closely.

## 2014-09-21 NOTE — Evaluation (Signed)
Physical Therapy Evaluation Patient Details Name: Tyler Burgess MRN: [id number] DOB: [birth year] Today's Date: 09/21/2014   History of Present Illness  60 yo male adm 09/15/14 with DKA, recent great and second toe/TMA at [facility], xrays reveal osteo and orthjo consult pending; PMHx:  depression, DM, psych d/o with HI adn SI.  R transmet amputation 09/20/14  Clinical Impression  Pt with hx as above and currently presenting with functional mobility limitations 2* TDWB limitations on R LE and post op pain.  Pt would benefit from follow up rehab at SNF level to maximize IND, safety and chances of wound healing and preventing need for further intervention.    Follow Up Recommendations SNF    Equipment Recommendations  Rolling walker with 5" wheels    Recommendations for Other Services       Precautions / Restrictions Precautions Precautions: Fall Required Braces or Orthoses: Other Brace/Splint Other Brace/Splint: R post op shoe Restrictions Weight Bearing Restrictions: Yes RLE Weight Bearing: Touchdown weight bearing Other Position/Activity Restrictions: TWB ordered post TMA      Mobility  Bed Mobility Overal bed mobility: Independent                Transfers Overall transfer level: Needs assistance Equipment used: Rolling walker (2 wheeled) Transfers: Sit to/from Stand Sit to Stand: Min guard         General transfer comment: cues for use of UEs to self assist and LE management to minimize WB in tranisitions  Ambulation/Gait Ambulation/Gait assistance: Min assist Ambulation Distance (Feet): 58 Feet Assistive device: Rolling walker (2 wheeled) Gait Pattern/deviations: Step-to pattern;Decreased step length - right;Decreased step length - left;Shuffle;Trunk flexed Gait velocity: decr Gait velocity interpretation: Below normal speed for age/gender General Gait Details: cues for sequence, posture, foot placement and TWB on R  Stairs            Wheelchair Mobility     Modified Rankin (Stroke Patients Only)       Balance                                             Pertinent Vitals/Pain Pain Assessment: 0-10 Pain Score: 7  Pain Location: R foot Pain Descriptors / Indicators: Aching;Sore Pain Intervention(s): Limited activity within patient's tolerance;Monitored during session;Premedicated before session    Home Living Family/patient expects to be discharged to:: Shelter/Homeless Living Arrangements: Alone                    Prior Function Level of Independence: Independent               Hand Dominance        Extremity/Trunk Assessment   Upper Extremity Assessment: Overall WFL for tasks assessed           Lower Extremity Assessment: RLE deficits/detail RLE Deficits / Details: TMA 09/20/14 - dressings in place    Cervical / Trunk Assessment: Normal  Communication   Communication: No difficulties  Cognition Arousal/Alertness: Awake/alert Behavior During Therapy: WFL for tasks assessed/performed Overall Cognitive Status: Within Functional Limits for tasks assessed                      General Comments      Exercises        Assessment/Plan    PT Assessment Patient needs continued PT services  PT Diagnosis Difficulty walking  PT Problem List Decreased strength;Decreased range of motion;Decreased activity tolerance;Decreased mobility;Decreased knowledge of use of DME;Pain;Decreased knowledge of precautions  PT Treatment Interventions Gait training;Functional mobility training;Therapeutic exercise;DME instruction   PT Goals (Current goals can be found in the Care Plan section) Acute Rehab PT Goals Patient Stated Goal: I need to get this healed because I dont want to lose my leg PT Goal Formulation: With patient Time For Goal Achievement: 10/05/14 Potential to Achieve Goals: Good    Frequency Min 5X/week   Barriers to discharge Decreased caregiver support;Other (comment) Pt  is currently homeless    Co-evaluation               End of Session Equipment Utilized During Treatment: Gait belt;Other (comment) (R post op shoe) Activity Tolerance: Patient tolerated treatment well;No increased pain Patient left: in bed;with call bell/phone within reach;with bed alarm set Nurse Communication: Mobility status         Time: 0940-1008 PT Time Calculation (min) (ACUTE ONLY): 28 min   Charges:   PT Evaluation $PT Re-evaluation: 1 Procedure PT Treatments $Gait Training: 8-22 mins   PT G Codes:        Brigham Cobbins 09/21/2014, 12:31 PM

## 2014-09-22 ENCOUNTER — Inpatient Hospital Stay (HOSPITAL_COMMUNITY): Admission: AD | Admit: 2014-09-22 | Payer: 59 | Source: Intra-hospital | Admitting: Psychiatry

## 2014-09-22 ENCOUNTER — Inpatient Hospital Stay (HOSPITAL_COMMUNITY): Payer: Medicaid Other

## 2014-09-22 ENCOUNTER — Encounter (HOSPITAL_COMMUNITY): Payer: Self-pay | Admitting: Radiology

## 2014-09-22 DIAGNOSIS — M6289 Other specified disorders of muscle: Secondary | ICD-10-CM

## 2014-09-22 DIAGNOSIS — R29810 Facial weakness: Secondary | ICD-10-CM

## 2014-09-22 LAB — GLUCOSE, CAPILLARY
GLUCOSE-CAPILLARY: 296 mg/dL — AB (ref 65–99)
GLUCOSE-CAPILLARY: 66 mg/dL (ref 65–99)
GLUCOSE-CAPILLARY: 86 mg/dL (ref 65–99)
GLUCOSE-CAPILLARY: 92 mg/dL (ref 65–99)
Glucose-Capillary: 168 mg/dL — ABNORMAL HIGH (ref 65–99)
Glucose-Capillary: 264 mg/dL — ABNORMAL HIGH (ref 65–99)

## 2014-09-22 LAB — MAGNESIUM: MAGNESIUM: 2 mg/dL (ref 1.7–2.4)

## 2014-09-22 LAB — CREATININE, SERUM
Creatinine, Ser: 1.41 mg/dL — ABNORMAL HIGH (ref 0.61–1.24)
GFR calc non Af Amer: 54 mL/min — ABNORMAL LOW (ref >60)

## 2014-09-22 MED ORDER — STROKE: EARLY STAGES OF RECOVERY BOOK
Freq: Once | Status: AC
  Administered 2014-09-22: 18:00:00
  Filled 2014-09-22: qty 1

## 2014-09-22 MED ORDER — OXYCODONE HCL 5 MG PO TABS
5.0000 mg | ORAL_TABLET | ORAL | Status: DC | PRN
  Administered 2014-09-22 – 2014-09-23 (×2): 5 mg via ORAL
  Filled 2014-09-22 (×3): qty 1

## 2014-09-22 MED ORDER — INSULIN GLARGINE 100 UNIT/ML ~~LOC~~ SOLN
15.0000 [IU] | Freq: Two times a day (BID) | SUBCUTANEOUS | Status: DC
  Filled 2014-09-22: qty 0.15

## 2014-09-22 MED ORDER — PNEUMOCOCCAL VAC POLYVALENT 25 MCG/0.5ML IJ INJ
0.5000 mL | INJECTION | INTRAMUSCULAR | Status: AC
  Administered 2014-09-23: 0.5 mL via INTRAMUSCULAR
  Filled 2014-09-22: qty 0.5

## 2014-09-22 MED ORDER — OXYCODONE HCL 5 MG PO TABS
10.0000 mg | ORAL_TABLET | Freq: Once | ORAL | Status: AC
  Administered 2014-09-23: 10 mg via ORAL
  Filled 2014-09-22: qty 2

## 2014-09-22 MED ORDER — INSULIN ASPART 100 UNIT/ML ~~LOC~~ SOLN: 0.0000 [IU] | Freq: Every day | SUBCUTANEOUS | Status: DC

## 2014-09-22 MED ORDER — INSULIN GLARGINE 100 UNIT/ML ~~LOC~~ SOLN
20.0000 [IU] | Freq: Two times a day (BID) | SUBCUTANEOUS | Status: DC
  Administered 2014-09-22: 20 [IU] via SUBCUTANEOUS
  Filled 2014-09-22: qty 0.2

## 2014-09-22 MED ORDER — INSULIN GLARGINE 100 UNIT/ML ~~LOC~~ SOLN
15.0000 [IU] | Freq: Two times a day (BID) | SUBCUTANEOUS | Status: DC
  Administered 2014-09-23 (×2): 15 [IU] via SUBCUTANEOUS
  Filled 2014-09-22 (×3): qty 0.15

## 2014-09-22 MED ORDER — ASPIRIN 81 MG PO CHEW
81.0000 mg | CHEWABLE_TABLET | Freq: Every day | ORAL | Status: DC
Start: 2014-09-22 — End: 2014-09-26
  Administered 2014-09-22 – 2014-09-26 (×5): 81 mg via ORAL
  Filled 2014-09-22 (×5): qty 1

## 2014-09-22 MED ORDER — SODIUM CHLORIDE 0.9 % IV SOLN
INTRAVENOUS | Status: DC
  Administered 2014-09-22: 08:00:00 via INTRAVENOUS

## 2014-09-22 MED ORDER — SENNOSIDES-DOCUSATE SODIUM 8.6-50 MG PO TABS: 1.0000 | ORAL_TABLET | Freq: Every evening | ORAL | Status: DC | PRN

## 2014-09-22 MED ORDER — INSULIN ASPART 100 UNIT/ML ~~LOC~~ SOLN
0.0000 [IU] | Freq: Three times a day (TID) | SUBCUTANEOUS | Status: DC
  Administered 2014-09-22: 3 [IU] via SUBCUTANEOUS
  Administered 2014-09-23: 8 [IU] via SUBCUTANEOUS
  Administered 2014-09-23: 3 [IU] via SUBCUTANEOUS
  Administered 2014-09-24: 8 [IU] via SUBCUTANEOUS
  Administered 2014-09-25: 2 [IU] via SUBCUTANEOUS
  Administered 2014-09-26: 5 [IU] via SUBCUTANEOUS

## 2014-09-22 NOTE — Progress Notes (Addendum)
Patient Demographics  Tyler Burgess, is a 59 y.o. male, DOB - 08/16/1955, ZOX:[id number]  Admit date - 09/15/2014   Admitting Physician Therisa Doyne, MD  Outpatient Primary MD for the patient is No PCP Per Patient  LOS - 7   Chief Complaint  Patient presents with  . Medical Clearance  . Suicidal  . Homicidal      Summary  This is a 59 year old Caucasian male with history of anxiety depression, smoking, type 2 diabetes mellitus with diabetic neuropathy, diabetes in poor control, homeless. Who had right foot diabetic gangrene and underwent resection few weeks ago by Dr. Marlyne Beards in general surgeon at [facility]. He subsequently did not take his anti-biotics postop as he was required to, he developed suicidal ideation and was brought by his brother for admission to behavioral Hospital. He was found to have right foot postop infection along with DKA.  His DKA was treated and fixed with IV insulin per protocol, he was seen by Dr. 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 He has finished his IV anti-biotics. He waits placement to either rehabilitation or to [facility]. Psych has seen him has discontinued sitter.   Subjective:   Tyler Burgess today has, No headache, No chest pain, No abdominal pain - No Nausea, No new weakness tingling or numbness, No Cough - SOB. Currently not suicidal or homicidal but at times he does get sad. Today no pain in the right foot.  Assessment & Plan    Addendum - at 5.15pm was called by the RN that patient has developed R arm weakness. He came to examine the patient, he definitely has right arm weakness strength is 2/5, does have facial droop but on the left ?Marland Kitchen He is right-handed. Patient says that he's been feeling well for the last 2-3 hours could  have been 4 hours but he never told anyone, his weakness was noticed by the RN. Cannot be given TPA as exact last known neurologically intact time is unknown. We will get a head CT. If negative full stroke protocol will be initiated. Aspirin for now. He is poorly controlled diabetic as before. Continue glycemic control. Discussed with neurology. Will be transferred to [facility].  CT unremarkable we'll initiate aspirin. For now nothing by mouth except medications. Will be seen by stroke nurse for a swallow eval. If passes swallow eval will place back on car modified diet.  Seen by Dr Hosie Poisson - MRI only for now, hold TTE and Carotids per him, transfer to [facility]. Could have Nerve Palsy as well.     1.DKA in a DM 2 patient due to non compliance - poor outpatient control as A1c was 13, counseled on compliance, his Lantus has been adjusted to twice a day for better control. Continue before meals at bedtime moderate dose sliding scale.  Lab Results  Component Value Date   HGBA1C 13.0* 09/16/2014   CBG (last 3)   Recent Labs  09/22/14 1134 09/22/14 1225 09/22/14 1653  GLUCAP 66 86 92     2.Osetomylitis right foot - post recent amputation of right first and second bit toe at [facility] by Dr. Marlyne Beards, he subsequently did not take his antibiotics as he was supposed to. He developed infection again in the  postoperative area, was seen by Dr. Lajoyce Cornersuda and underwent transmetatarsal right foot amputation on 09-20-14. Stopped all IV anti-biotics on 09/22/2014. PTOT and placement.   3. Suicidal and homicidal ideations. Currently resolved. Psych has been consulted, discussed the case with the psychiatrist sitter was removed by the psychiatrist on 09/19/2014 without any problems. He is currently not suicidal. Social work looking for placement either to behavioral health or to SNF .   4. C. Difficile recent. Stool is now solid, clinically infection resolved.   5. Mild ARF - hydrate, repeat BMP in am.    Code  Status: Full  Family Communication: None  Disposition Plan: [facility] versus rehabilitation, disposition will be decided by Psych and S Work.  If he is safe for SNF DC then goes to SNF, [facility]. Placement is not a Medical decision in this patient.   Consults  Dr Lajoyce Cornersuda   Procedures  post right transmetatarsal amputation on 09/20/2014 by Dr. Lajoyce Cornersuda   DVT Prophylaxis  Lovenox    Lab Results  Component Value Date   PLT 169 09/21/2014    Medications  Scheduled Meds: .  stroke: mapping our early stages of recovery book   Does not apply Once  . ARIPiprazole  5 mg Oral BID  . aspirin  81 mg Oral Daily  . benztropine  0.5 mg Oral BID  . enoxaparin (LOVENOX) injection  40 mg Subcutaneous Q24H  . gabapentin  200 mg Oral TID  . insulin aspart  0-15 Units Subcutaneous TID WC  . [START ON 09/23/2014] insulin glargine  15 Units Subcutaneous BID  . potassium chloride  20 mEq Oral Once  . saccharomyces boulardii  250 mg Oral BID  . sertraline  100 mg Oral Daily  . traZODone  100 mg Oral QHS   Continuous Infusions: . sodium chloride 10 mL/hr at 09/20/14 2102  . sodium chloride 100 mL/hr at 09/22/14 0752   PRN Meds:.acetaminophen **OR** [DISCONTINUED] acetaminophen, dextrose, [DISCONTINUED] ondansetron **OR** ondansetron (ZOFRAN) IV, oxyCODONE, senna-docusate  Antibiotics     Anti-infectives    Start     Dose/Rate Route Frequency Ordered Stop   09/21/14 1000  vancomycin (VANCOCIN) IVPB 750 mg/150 ml premix     750 mg 150 mL/hr over 60 Minutes Intravenous 2 times daily 09/21/14 0855 09/21/14 2329   09/16/14 1500  ceFEPIme (MAXIPIME) 1 g in dextrose 5 % 50 mL IVPB     1 g 100 mL/hr over 30 Minutes Intravenous 3 times per day 09/16/14 1300 09/21/14 2259   09/16/14 1400  metroNIDAZOLE (FLAGYL) IVPB 500 mg  Status:  Discontinued     500 mg 100 mL/hr over 60 Minutes Intravenous 3 times per day 09/16/14 1219 09/22/14 0735   09/16/14 1400  vancomycin (VANCOCIN) IVPB 1000 mg/200 mL  premix  Status:  Discontinued     1,000 mg 200 mL/hr over 60 Minutes Intravenous 2 times daily 09/16/14 1300 09/21/14 0855   09/16/14 0000  vancomycin (VANCOCIN) 50 mg/mL oral solution 125 mg  Status:  Discontinued     125 mg Oral 4 times per day 09/15/14 2101 09/22/14 0735   09/15/14 2200  doxycycline (VIBRA-TABS) tablet 100 mg  Status:  Discontinued     100 mg Oral Every 12 hours 09/15/14 2101 09/16/14 1219        Objective:   Filed Vitals:   09/21/14 0410 09/21/14 1400 09/22/14 0531 09/22/14 1416  BP: 100/53 99/62 111/70 90/56  Pulse: 65 63 51 55  Temp: 98.4  F (36.9 C) 98.4 F (36.9 C) 98.2 F (36.8 C) 98 F (36.7 C)  TempSrc: Oral Oral Oral Oral  Resp: Height:      Weight:      SpO2: 100% 95% 98% 95%    Wt Readings from Last 3 Encounters:  09/16/14 74.844 kg (165 lb)  09/03/14 85.73 kg (189 lb)  07/24/14 74.844 kg (165 lb)     Intake/Output Summary (Last 24 hours) at 09/22/14 1727 Last data filed at 09/22/14 1250  Gross per 24 hour  Intake    480 ml  Output   2600 ml  Net  -2120 ml     Physical Exam  Awake Alert, Oriented X 3, No new F.N deficits, Normal affect [place].AT,PERRAL Supple Neck,No JVD, No cervical lymphadenopathy appriciated.  Symmetrical Chest wall movement, Good air movement bilaterally, CTAB RRR,No Gallops,Rubs or new Murmurs, No Parasternal Heave +ve B.Sounds, Abd Soft, No tenderness, No organomegaly appriciated, No rebound - guarding or rigidity. No Cyanosis, Clubbing or edema, No new Rash or bruise  Right foot under bandage post transmetatarsal amputation.   Data Review   Micro Results Recent Results (from the past 240 hour(s))  Surgical pcr screen     Status: None   Collection Time: 09/20/14  4:43 AM  Result Value Ref Range Status   MRSA, PCR NEGATIVE NEGATIVE Final   Staphylococcus aureus NEGATIVE NEGATIVE Final    Comment:        The Xpert SA Assay (FDA approved for NASAL specimens in patients over 21 years of  age), is one component of a comprehensive surveillance program.  Test performance has been validated by [facility] for patients greater than or equal to 55 year old. It is not intended to diagnose infection nor to guide or monitor treatment.     Radiology Reports    Dg Foot 2 Views Right  09/16/2014   CLINICAL DATA:  Osteomyelitis.  EXAM: RIGHT FOOT - 2 VIEW  COMPARISON:  08/31/2014  FINDINGS: Interval resection of the first and second toe. There is now cortical irregularity of the distal first metatarsal consistent with osteomyelitis. Soft tissue air noted in the surgical bed about the second digit. No additional site concerning for osteomyelitis.  IMPRESSION: Post resection of the first and second toes. 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   Electronically Signed   By: Rubye Oaks M.D.   On: 09/16/2014 02:48        CBC  Recent Labs Lab 09/15/14 2120 09/16/14 0522 09/17/14 0523 09/18/14 0526 09/21/14 0759  WBC 11.2* 8.9 9.7 7.2 10.4  HGB 9.5* 9.7* 10.3* 10.7* 9.5*  HCT 27.8* 28.1* 30.6* 33.3* 30.5*  PLT 214 184 188 162 169  MCV 92.1 93.0 94.4 96.2 99.3  MCH 31.5 32.1 31.8 30.9 30.9  MCHC 34.2 34.5 33.7 32.1 31.1  RDW 12.9 12.9 13.0 13.4 14.0    Chemistries   Recent Labs Lab 09/16/14 0522 09/16/14 0934 09/17/14 0523 09/18/14 0526 09/18/14 0845 09/19/14 0525 09/21/14 0759 09/22/14 0522  NA 134* 133* 137 136  --  136 137  --   K 3.5 3.5 4.0 4.8  --  3.9 4.3  --   CL 102 102 104 102  --  101 100*  --   CO2 --  29 30  --   GLUCOSE 228* 253* 131* 151*  --  180* 153*  --   BUN --  12 14  --   CREATININE 0.89 0.86 0.91 1.02 1.01 1.00 1.20 1.41*  CALCIUM 7.7* 7.8* 8.3* 8.7*  --  8.6* 8.3*  --   MG 1.5*  --  1.6*  --   --  1.8 1.6* 2.0  AST 39  --   --   --   --   --   --   --   ALT 38  --   --   --   --   --   --   --   ALKPHOS 155*  --   --   --   --   --   --   --   BILITOT 0.5  --   --   --    --   --   --   --    ------------------------------------------------------------------------------------------------------------------ estimated creatinine clearance is 59.7 mL/min (by C-G formula based on Cr of 1.41). ------------------------------------------------------------------------------------------------------------------ No results for input(s): HGBA1C in the last 72 hours. ------------------------------------------------------------------------------------------------------------------ No results for input(s): CHOL, HDL, LDLCALC, TRIG, CHOLHDL, LDLDIRECT in the last 72 hours. ------------------------------------------------------------------------------------------------------------------ No results for input(s): TSH, T4TOTAL, T3FREE, THYROIDAB in the last 72 hours.  Invalid input(s): FREET3 ------------------------------------------------------------------------------------------------------------------ No results for input(s): VITAMINB12, FOLATE, FERRITIN, TIBC, IRON, RETICCTPCT in the last 72 hours.  Coagulation profile No results for input(s): INR, PROTIME in the last 168 hours.  No results for input(s): DDIMER in the last 72 hours.  Cardiac Enzymes  Recent Labs Lab 09/15/14 2120 09/16/14 0105 09/16/14 0934  TROPONINI <0.03 <0.03 <0.03   ------------------------------------------------------------------------------------------------------------------ Invalid input(s): POCBNP   Time Spent in minutes   35   SINGH,PRASHANT K M.D on 09/22/2014 at 5:27 PM  Between 7am to 7pm - Pager - [phone]  After 7pm go to [email or web address] - password [facility]  [facility]   Office  [phone]

## 2014-09-22 NOTE — Progress Notes (Signed)
Dressing changed per MD. 4x4, abd pad and ace wrap placed and cleaned with saline. Pt tolerated well.

## 2014-09-22 NOTE — Clinical Social Work Note (Signed)
[facility] the [facility]  CS[facility] so clarity was needed.  CSW called and spoke with MD Thedore Mins(Singh) who stated that he wanted SNF before inpatient and that it was clearly written that way in his notes asking "didn't you read my note?"  CSW called and spoke with supervisor to gain clarity on recommendations.  It was suggested that CSW call psychiatrist to have him see pt today and provide a recommendation that was clear  CSW called and spoke with psychiatrist to gain further understanding asking for him to see pt today and provide a clearer recommendation  Psychiatrist stated that his note was clear and he wanted pt to go to inpatient agreeing to meet with CSW to further clarify  Psychiatrist did meet with CSW and she provided information that MD wanted SNF over inpatient.  CSW asked the psychiatrist to speak to MD to get on the same page with recommendation  Psychiatrist agreed to call MD in the presence of CSW and when he got off the phone he stated that both he and MD are in agreement that pt needs in patient psychiatric care over SNF  [facility] to provide them clarification and ho[efully gain a bed for pt  .Tyler Bubaegina Woodroe Vogan, LCSW [facility] Clinical Social Worker - Weekend Coverage cell #: [phone]

## 2014-09-22 NOTE — Progress Notes (Addendum)
Hypoglycemic Event  CBG: 66  Treatment: 15 GM carbohydrate snack  Symptoms: None  Follow-up CBG: Time: 1225 CBG Result: 86  Possible Reasons for Event: Medication regimen: Lantus   Comments/MD notified:     Rondel JumboDumas, Chavon Lucarelli S  Remember to initiate Hypoglycemia Order Set & complete

## 2014-09-22 NOTE — Evaluation (Signed)
Occupational Therapy Evaluation Patient Details Name: Tyler LoganBilly Coffie MRN: [id number] DOB: 04/27/1955 Today's Date: 09/22/2014    History of Present Illness 59 yo male adm 09/15/14 with DKA, [facility]rays reveal osteo and orthjo consult pending; PMHx:  depression, DM, psych d/o with HI adn SI.  R transmet amputation 09/20/14   Clinical Impression   Pt reports pain 10/10 burning with functional tasks but able to participate and complete all tasks. Pt would benefit from SNF at d/c to increase safety and independence with self care tasks. Will follow on acute.    Follow Up Recommendations  SNF;Supervision/Assistance - 24 hour    Equipment Recommendations  Other (comment) (defer next venue)    Recommendations for Other Services       Precautions / Restrictions Precautions Precautions: Fall Required Braces or Orthoses: Other Brace/Splint Other Brace/Splint: R post op shoe Restrictions Weight Bearing Restrictions: Yes RLE Weight Bearing: Touchdown weight bearing Other Position/Activity Restrictions: TWB ordered post TMA      Mobility Bed Mobility               General bed mobility comments: in the chair upon arrival  Transfers Overall transfer level: Needs assistance Equipment used: Rolling walker (2 wheeled) Transfers: Sit to/from Stand Sit to Stand: Min guard         General transfer comment: verbal cues for hand placement and overall safety.    Balance                                            ADL Overall ADL's : Needs assistance/impaired Eating/Feeding: Independent;Sitting   Grooming: Wash/dry hands;Set up;Sitting   Upper Body Bathing: Set up;Sitting   Lower Body Bathing: Minimal assistance;Sit to/from stand   Upper Body Dressing : Set up;Sitting   Lower Body Dressing: Minimal assistance;Sit to/from stand   Toilet Transfer: Minimal assistance;Ambulation;Comfort height toilet;RW;Grab bars    Toileting- Clothing Manipulation and Hygiene: Minimal assistance;Sit to/from stand         General ADL Comments: Educated on sequence for LB dressing task and pt practiced with donning a pair of underwear over post op shoe and doffing again. Pt needed min assist for light balance support in standing to manage clothing. He needs frequent verbal cues for safety with hand placement including to reach back for the recliner to sit. Discussed SNF with pt to increase independence at d/c and pt is agreeable.      Vision     Perception     Praxis      Pertinent Vitals/Pain Pain Assessment: 0-10 Pain Score: 10-Worst pain ever Pain Location: L foot Pain Descriptors / Indicators: Burning Pain Intervention(s): Repositioned     Hand Dominance     Extremity/Trunk Assessment Upper Extremity Assessment Upper Extremity Assessment: Overall WFL for tasks assessed           Communication Communication Communication: No difficulties   Cognition Arousal/Alertness: Awake/alert Behavior During Therapy: WFL for tasks assessed/performed Overall Cognitive Status: Within Functional Limits for tasks assessed                     General Comments       Exercises       Shoulder Instructions      Home Living Family/patient expects to be discharged to:: Shelter/Homeless Living Arrangements: Alone  Prior Functioning/Environment Level of Independence: Independent             OT Diagnosis: Generalized weakness   OT Problem List: Decreased strength;Decreased knowledge of use of DME or AE   OT Treatment/Interventions: Self-care/ADL training;Patient/family education;Therapeutic activities;DME and/or AE instruction    OT Goals(Current goals can be found in the care plan section) Acute Rehab OT Goals Patient Stated Goal: wants to go to SNF to increase independence. OT Goal Formulation: With patient Time For Goal Achievement:  10/06/14 Potential to Achieve Goals: Good  OT Frequency: Min 2X/week   Barriers to D/C:            Co-evaluation              End of Session Equipment Utilized During Treatment: Gait belt;Rolling walker  Activity Tolerance: Patient tolerated treatment well Patient left: in chair;with call bell/phone within reach   Time: 1015-1040 OT Time Calculation (min): 25 min Charges:  OT General Charges $OT Visit: 1 Procedure OT Evaluation $Initial OT Evaluation Tier I: 1 Procedure OT Treatments $Therapeutic Activity: 8-22 mins G-Codes:    Lennox Laity  [phone] 09/22/2014, 1:08 PM

## 2014-09-22 NOTE — Progress Notes (Signed)
Physical Therapy Treatment Patient Details Name: Tyler LoganBilly Bellisario MRN: [id number] DOB: 02/01/1956 Today's Date: 09/22/2014    History of Present Illness 59 yo male adm 09/15/14 with DKA, [facility], xrays reveal osteo and orthjo consult pending; PMHx:  depression, DM, psych d/o with HI adn SI.  R transmet amputation 09/20/14    PT Comments    Steady progress with activity tolerance and ability to demonstrate compliance with TDWB on R.  Follow Up Recommendations  SNF     Equipment Recommendations  Rolling walker with 5" wheels    Recommendations for Other Services       Precautions / Restrictions Precautions Precautions: Fall Required Braces or Orthoses: Other Brace/Splint Other Brace/Splint: R post op shoe Restrictions Weight Bearing Restrictions: Yes RLE Weight Bearing: Touchdown weight bearing Other Position/Activity Restrictions: TWB ordered post TMA    Mobility  Bed Mobility Overal bed mobility: Independent                Transfers Overall transfer level: Needs assistance Equipment used: Rolling walker (2 wheeled) Transfers: Sit to/from Stand Sit to Stand: Min guard         General transfer comment: cues for use of UEs to self assist and LE management to minimize WB in tranisitions  Ambulation/Gait Ambulation/Gait assistance: Min assist;Min guard Ambulation Distance (Feet): 150 Feet Assistive device: Rolling walker (2 wheeled) Gait Pattern/deviations: Step-to pattern;Decreased step length - right;Decreased step length - left;Shuffle;Trunk flexed Gait velocity: decr   General Gait Details: cues for sequence, posture, foot placement and TWB on R   Stairs            Wheelchair Mobility    Modified Rankin (Stroke Patients Only)       Balance                                    Cognition Arousal/Alertness: Awake/alert Behavior During Therapy: WFL for tasks assessed/performed Overall Cognitive  Status: Within Functional Limits for tasks assessed                      Exercises      General Comments        Pertinent Vitals/Pain Pain Assessment: 0-10 Pain Score: 7  Pain Location: L foot Pain Descriptors / Indicators: Aching Pain Intervention(s): Limited activity within patient's tolerance;Monitored during session;Premedicated before session    Home Living                      Prior Function            PT Goals (current goals can now be found in the care plan section) Acute Rehab PT Goals Patient Stated Goal: I need to get this healed because I dont want to lose my leg PT Goal Formulation: With patient Time For Goal Achievement: 10/05/14 Potential to Achieve Goals: Good Progress towards PT goals: Progressing toward goals    Frequency  Min 5X/week    PT Plan Current plan remains appropriate    Co-evaluation             End of Session Equipment Utilized During Treatment: Gait belt;Other (comment) Activity Tolerance: Patient tolerated treatment well;No increased pain Patient left: in chair;with call bell/phone within reach     Time: 0908-0936 PT Time Calculation (min) (ACUTE ONLY): 28 min  Charges:  $Gait Training: 23-37 mins  G Codes:      Daliyah Sramek 2014/10/03, 11:44 AM

## 2014-09-22 NOTE — Clinical Social Work Note (Signed)
CSW secured a bed at [facility]. Consents signed by patient  CSW met with RN to let her know that bed was available.  RN called and spoke with Md who stated pt is not ready for discharge today .  MD states that "pt is in kidney failure".    CSW called and spoke with [facility] staff to let them know and will call tomorrow for bed availability if pt is stable  .Dede Query, LCSW [facility] Clinical Social Worker - Weekend Coverage cell #: [phone]

## 2014-09-22 NOTE — Progress Notes (Signed)
Pt arrived via 3 [facility] workers with VSS and reporting no pain. Melina Schoolshompson, Brendan Gruwell E, [place]RN 09/22/2014 10:08 PM

## 2014-09-22 NOTE — Treatment Plan (Signed)
Received call from [place] with social work who reported that she had recent communication with attending MD who felt that pt is NOT medically cleared at this point [facility].  [facility] will continue to review pt for admission on a daily basis.

## 2014-09-22 NOTE — Consult Note (Addendum)
Stroke Consult    Chief Complaint: right sided weakness and slurred speech HPI: Tyler Burgess is an 59 y.o. 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 Uneventful hospital stay until patient woke up from a nap on 5/28 and noted inability to use his right hand. LSW noon, woke up with weakness around 1730. Per RN/primary team upon there evaluation there was also a notable left facial droop and question of slurred speech. Facial droop and speech has improved but he continues to have weakness in his RUE. He feels the whole RUE is involved but it is most pronounced from the wrist down.   No prior stroke or TIA history. CT head imaging reviewed and no acute process. Labs pertinent for hemoglobin A1c of 13. He has been evaluated by psychiatry during this hospital stay for suicidal ideation, recommended psychiatry inpatient placement once medically stable.   Of note, patient was taking a nap in a chair prior to symptom onset. He believes his right arm may have been draped over the edge of the chair hanging down.    Date last known well: 09/22/2014 Time last known well: 1200 tPA Given: no, outside IV tPA window, recent surgery  Past Medical History  Diagnosis Date  . Diabetes mellitus without complication   . Depression     Past Surgical History  Procedure Laterality Date  . Right arm    . Cholecystectomy    . Amputation toe Right 09/03/2014    Procedure: AMPUTATION RIGHT GREAT TOE AND SECOND TOE;  Surgeon: Franky MachoMark Jenkins Md, MD;  Location: [facility];  Service: General;  Laterality: Right;  right great and second toes  . Amputation Right 09/20/2014    Procedure: RIGHT TRANSMETATARSAL AMPUTATION;  Surgeon: Nadara MustardMarcus Duda V, MD;  Location: [facility];  Service: Orthopedics;  Laterality: Right;    Family History  Problem Relation Age of Onset  . Diabetes Mother   . Depression Father   . Depression Brother   .  Diabetes Other   . Depression Other    Social History:  reports that he has been smoking Cigarettes.  He has a 17.5 pack-year smoking history. He does not have any smokeless tobacco history on file. He reports that he drinks about 1.2 oz of alcohol per week. He reports that he does not use illicit drugs.  Allergies: No Known Allergies  Medications Prior to Admission  Medication Sig Dispense Refill  . insulin aspart (NOVOLOG) 100 UNIT/ML injection Inject 3 Units into the skin 3 (three) times daily with meals. (Patient taking differently: Inject 10 Units into the skin 3 (three) times daily with meals. ) 10 mL 11  . insulin glargine (LANTUS) 100 UNIT/ML injection Inject 0.05 mLs (5 Units total) into the skin at bedtime. (Patient taking differently: Inject 24 Units into the skin at bedtime. ) 10 mL 11  . benztropine (COGENTIN) 0.5 MG tablet Take 1 tablet (0.5 mg total) by mouth 2 (two) times daily. (Patient not taking: Reported on 09/15/2014) 60 tablet 0  . doxycycline (VIBRA-TABS) 100 MG tablet Take 1 tablet (100 mg total) by mouth every 12 (twelve) hours. (Patient not taking: Reported on 09/15/2014) 14 tablet 0  . hydrOXYzine (ATARAX/VISTARIL) 50 MG tablet Take 1 tablet (50 mg total) by mouth 3 (three) times daily. (Patient not taking: Reported on 09/15/2014) 30 tablet 0  . Multiple Vitamin (MULTIVITAMIN WITH MINERALS) TABS tablet Take 1 tablet by mouth  daily. (Patient not taking: Reported on 09/15/2014)    . oxyCODONE 10 MG TABS Take 1 tablet (10 mg total) by mouth every 6 (six) hours as needed for moderate pain or severe pain. (Patient not taking: Reported on 09/15/2014) 15 tablet 0  . saccharomyces boulardii (FLORASTOR) 250 MG capsule Take 1 capsule (250 mg total) by mouth 2 (two) times daily. (Patient not taking: Reported on 09/15/2014) 60 capsule 1  . vancomycin (VANCOCIN) 50 mg/mL oral solution Take 125 mg PO QID for 14 days QTY sufficient for 14 days (Patient not taking: Reported on 09/15/2014) 10 mL  0    ROS: Out of a complete 14 system review, the patient complains of only the following symptoms, and all other reviewed systems are negative. +weakness  Physical Examination: Filed Vitals:   09/22/14 1416  BP: 90/56  Pulse: 55  Temp: 98 F (36.7 C)  Resp: 18   Physical Exam  Constitutional: He appears well-developed and well-nourished.  Psych: Affect appropriate to situation Eyes: No scleral injection HENT: No OP obstrucion Head: Normocephalic.  Cardiovascular: Normal rate and regular rhythm.  Respiratory: Effort normal and breath sounds normal.  GI: Soft. Bowel sounds are normal. No distension. There is no tenderness.  Skin: WDI   Neurologic Examination: Mental Status: Alert, oriented, thought content appropriate.  Speech fluent without evidence of aphasia.  Able to follow 3 step commands without difficulty. Cranial Nerves: II: funduscopic exam wnl bilaterally, visual fields grossly normal, pupils equal, round, reactive to light and accommodation III,IV, VI: ptosis not present, extra-ocular motions intact bilaterally V,VII: flattening of left NLF, facial light touch sensation normal bilaterally VIII: hearing normal bilaterally IX,X: gag reflex present XI: trapezius strength/neck flexion strength normal bilaterally XII: tongue strength normal  Motor: 5/5 in all extremities with the exception of 2/5 right wrist extension and 3/5 finger extension and 3/5 finger abduction Tone and bulk:normal tone throughout; no atrophy noted Sensory: Pinprick and light touch intact throughout, bilaterally Deep Tendon Reflexes: 1+ and symmetric throughout, did not test right achilles Plantars: Right: did not test   Left: downgoing Cerebellar: Difficulty on RUE, did not test RLE, normal FTN and HTS on the left Gait: deferred  Laboratory Studies:   Basic Metabolic Panel:  Recent Labs Lab 09/16/14 0522 09/16/14 1610 09/17/14 0523 09/18/14 0526 09/18/14 0845 09/19/14 0525  09/21/14 0759 09/22/14 0522  NA 134* 133* 137 136  --  136 137  --   K 3.5 3.5 4.0 4.8  --  3.9 4.3  --   CL 102 102 104 102  --  101 100*  --   CO2 26 25 26 26   --  29 30  --   GLUCOSE 228* 253* 131* 151*  --  180* 153*  --   BUN 11 11 12 12   --  12 14  --   CREATININE 0.89 0.86 0.91 1.02 1.01 1.00 1.20 1.41*  CALCIUM 7.7* 7.8* 8.3* 8.7*  --  8.6* 8.3*  --   MG 1.5*  --  1.6*  --   --  1.8 1.6* 2.0  PHOS 2.4*  --   --   --   --   --   --   --     Liver Function Tests:  Recent Labs Lab 09/16/14 0522  AST 39  ALT 38  ALKPHOS 155*  BILITOT 0.5  PROT 6.2*  ALBUMIN 2.9*   No results for input(s): LIPASE, AMYLASE in the last 168 hours. No results for input(s): AMMONIA in  the last 168 hours.  CBC:  Recent Labs Lab 09/15/14 2120 09/16/14 0522 09/17/14 0523 09/18/14 0526 09/21/14 0759  WBC 11.2* 8.9 9.7 7.2 10.4  HGB 9.5* 9.7* 10.3* 10.7* 9.5*  HCT 27.8* 28.1* 30.6* 33.3* 30.5*  MCV 92.1 93.0 94.4 96.2 99.3  PLT 214 184 188 162 169    Cardiac Enzymes:  Recent Labs Lab 09/15/14 2120 09/16/14 0105 09/16/14 0934  TROPONINI <0.03 <0.03 <0.03    BNP: Invalid input(s): POCBNP  CBG:  Recent Labs Lab 09/22/14 0033 09/22/14 0729 09/22/14 1134 09/22/14 1225 09/22/14 1653  GLUCAP 296* 168* 66 86 92    Microbiology: Results for orders placed or performed during the hospital encounter of 09/15/14  Surgical pcr screen     Status: None   Collection Time: 09/20/14  4:43 AM  Result Value Ref Range Status   MRSA, PCR NEGATIVE NEGATIVE Final   Staphylococcus aureus NEGATIVE NEGATIVE Final    Comment:        The Xpert SA Assay (FDA approved for NASAL specimens in patients over 77 years of age), is one component of a comprehensive surveillance program.  Test performance has been validated by [facility] for patients greater than or equal to 5 year old. It is not intended to diagnose infection nor to guide or monitor treatment.     Coagulation  Studies: No results for input(s): LABPROT, INR in the last 72 hours.  Urinalysis: No results for input(s): COLORURINE, LABSPEC, PHURINE, GLUCOSEU, HGBUR, BILIRUBINUR, KETONESUR, PROTEINUR, UROBILINOGEN, NITRITE, LEUKOCYTESUR in the last 168 hours.  Invalid input(s): APPERANCEUR  Lipid Panel:     Component Value Date/Time   CHOL 120 04/17/2014 2009   TRIG 61 04/17/2014 2009   HDL 58 04/17/2014 2009   CHOLHDL 2.1 04/17/2014 2009   VLDL 12 04/17/2014 2009   LDLCALC 50 04/17/2014 2009    HgbA1C:  Lab Results  Component Value Date   HGBA1C 13.0* 09/16/2014    Urine Drug Screen:     Component Value Date/Time   LABOPIA NONE DETECTED 09/15/2014 1650   COCAINSCRNUR NONE DETECTED 09/15/2014 1650   LABBENZ NONE DETECTED 09/15/2014 1650   AMPHETMU NONE DETECTED 09/15/2014 1650   THCU NONE DETECTED 09/15/2014 1650   LABBARB NONE DETECTED 09/15/2014 1650    Alcohol Level: No results for input(s): ETH in the last 168 hours.  Other results:  Imaging: Ct Head Wo Contrast  09/22/2014   CLINICAL DATA:  Code stroke. Right arm numbness and weakness, acute onset. Initial encounter.  EXAM: CT HEAD WITHOUT CONTRAST  TECHNIQUE: Contiguous axial images were obtained from the base of the skull through the vertex without intravenous contrast.  COMPARISON:  None.  FINDINGS: There is no evidence of acute infarction, mass lesion, or intra- or extra-axial hemorrhage on CT.  Prominence of the ventricles and sulci reflects mild cortical volume loss. Mild periventricular white matter change likely reflects small vessel ischemic microangiopathy.  The brainstem and fourth ventricle are within normal limits. The basal ganglia are unremarkable in appearance. The cerebral hemispheres demonstrate grossly normal gray-white differentiation. No mass effect or midline shift is seen.  There is no evidence of fracture; visualized osseous structures are unremarkable in appearance. The orbits are within normal limits. The  paranasal sinuses and mastoid air cells are well-aerated. No significant soft tissue abnormalities are seen.  IMPRESSION: 1. No acute intracranial pathology seen on CT. 2. Mild cortical volume loss and scattered small vessel ischemic microangiopathy.  These results were called by telephone at the time  of interpretation on 09/22/2014 at 5:37 pm to Dr. Susa Raring, who verbally acknowledged these results.   Electronically Signed   By: Roanna Raider M.D.   On: 09/22/2014 17:37    Assessment: 59 y.o. male hx of DM (poorly controlled) with recent transmetatarsal amputation due to diabetic complications. Code stroke activated after patient awoke with facial droop and RUE weakness. Upon my exam patient had mild left NLF flattening and notable RUE weakness predominantly in the distal extensors. Presentation upon waking after sleeping in a chair raises possibility of radial nerve palsy though this would not account for the reported severe facial droop and speech changes. Based on risk factors would agree with MRI to rule out stroke/TIA.   tPA not given as patient outside IV tPA window and history of recent surgery.   -MRI brain without contrast. If positive complete stroke workup.  -ASA  daily  -rehab evaluation -if stroke ruled out, would potentially benefit from EMG/NCS as outpatient  Elspeth Cho, DO [facility] [phone]  If 7pm- 7am, please page neurology on call as listed in AMION. 09/22/2014, 6:11 PM

## 2014-09-22 NOTE — Progress Notes (Signed)
Patient ID: Tyler Burgess, male   DOB: 09/22/1955, 59 y.o.   MRN: [id number] Postoperative transmetatarsal amputation on the right. Patient's dressing has been changed. The dressing is clean and dry. Patient is in his postoperative shoe. Again stressed the importance of minimize weightbearing on his foot he will wash the foot with soap and water and change the dressing as needed. He will he put Triple Antibiotic ointment on the dressing. I will follow-up in the office in 3 weeks to remove the sutures.

## 2014-09-23 ENCOUNTER — Inpatient Hospital Stay (HOSPITAL_COMMUNITY): Payer: Medicaid Other

## 2014-09-23 DIAGNOSIS — R531 Weakness: Secondary | ICD-10-CM

## 2014-09-23 DIAGNOSIS — E871 Hypo-osmolality and hyponatremia: Secondary | ICD-10-CM

## 2014-09-23 DIAGNOSIS — E1140 Type 2 diabetes mellitus with diabetic neuropathy, unspecified: Secondary | ICD-10-CM

## 2014-09-23 LAB — GLUCOSE, CAPILLARY
GLUCOSE-CAPILLARY: 272 mg/dL — AB (ref 65–99)
GLUCOSE-CAPILLARY: 156 mg/dL — AB (ref 65–99)
GLUCOSE-CAPILLARY: 72 mg/dL (ref 65–99)
GLUCOSE-CAPILLARY: 89 mg/dL (ref 65–99)
Glucose-Capillary: 95 mg/dL (ref 65–99)
Glucose-Capillary: 60 mg/dL — ABNORMAL LOW (ref 65–99)

## 2014-09-23 MED ORDER — INSULIN ASPART 100 UNIT/ML ~~LOC~~ SOLN
5.0000 [IU] | Freq: Three times a day (TID) | SUBCUTANEOUS | Status: DC
  Administered 2014-09-23 – 2014-09-26 (×7): 5 [IU] via SUBCUTANEOUS

## 2014-09-23 MED ORDER — OXYCODONE HCL 5 MG PO TABS
15.0000 mg | ORAL_TABLET | ORAL | Status: DC | PRN
  Administered 2014-09-23 – 2014-09-24 (×9): 15 mg via ORAL
  Filled 2014-09-23 (×9): qty 3

## 2014-09-23 NOTE — Progress Notes (Signed)
Patient Demographics Tyler Burgess, is a 59 y.o. male, DOB - 09-30-1955, RUE:[id number] Admit date - 09/15/2014   Admitting Physician Therisa Doyne, MD Outpatient Primary MD for the patient is No PCP Per Patient LOS - 8  Summary  This is a 59 year old Caucasian male with history of anxiety depression, smoking, type 2 diabetes mellitus with diabetic neuropathy, diabetes in poor control, homeless. Who had right foot diabetic gangrene and underwent resection few weeks ago by Dr. Marlyne Beards in general surgeon at [facility]. He subsequently did not take his anti-biotics postop as he was required to, he developed suicidal ideation and was brought by his brother for admission to behavioral Hospital. He was found to have right foot postop infection along with HHS. His HHS was treated and fixed with IV insulin per protocol, he was seen by Dr. 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 He has finished his IV anti-biotics. He waits placement to either rehabilitation or to [facility]. Psych has seen him has discontinued sitter.   Subjective:   Seen with his brother at bedside. No facial droop I can elicit, patient has total paralysis in the extensors of the fingers. Findings consistent with wrist drop and radial nerve palsy, MRI of the brain is negative.   Assessment & Plan   Hyperosmolar hyperglycemic state Presented to the hospital with blood sugar of 1016, very mild acidosis with bicarbonate of 20. Findings consistent with hyperosmolar hyperglycemic state rather than DKA. Patient treated with aggressive hydration with IV fluids and intensive intravenous insulin therapy. Blood sugars controlled now.  Wristdrop Patient developed wrist drop and inability to extend his right  fingers/wrist. MRI of the brain showed no acute events, physical exam showed right wrist drop. This is could be secondary to radial mononeuropathy from pressure from awkward position while sleeping or secondary to diabetic mononeuritis multiplex  Uncontrolled diabetes mellitus type 2 Hemoglobin A1c is 13.0, this correlate with mean plasma glucose of 326. Patient was not using his insulin for some time secondary to financial and compliance issues. Back on his insulin, currently on 15 units of insulin twice a day, I will add 5 units of NovoLog with meals.  Osetomylitis right foot Recent right first and second toe amputation done in [facility] by Dr. Lovell Sheehan. Poor healing and infection, patient undergone right transmetatarsal amputation on 5/16. Antibiotics discontinued on 5/28.  Suicidal and homicidal ideations Initially on admission patient had intense suicidal/homicidal ideation, this is resolved. Psych evaluated the patient, patient will be discharged to behavioral health when clinically stable.  C. Difficile recent Stool is now solid, clinically infection resolved.  Mild ARF Hydrate with IV fluids, check BMP in a.m.    Code Status: Full  Family Communication: None  Disposition Plan: [facility] versus rehabilitation, disposition will be decided by Psych and S Work.  Consults  Dr Lajoyce Corners, Dr. Pearlean Brownie   Procedures  post right transmetatarsal amputation on 09/20/2014 by Dr. Lajoyce Corners   DVT Prophylaxis  Lovenox    Lab Results  Component Value Date   PLT 169 09/21/2014    Medications  Scheduled Meds: . ARIPiprazole  5 mg Oral BID  . aspirin  81 mg Oral Daily  . benztropine  0.5 mg Oral BID  . enoxaparin (LOVENOX) injection  40 mg Subcutaneous Q24H  . gabapentin  200 mg Oral TID  . insulin aspart  0-15 Units Subcutaneous TID WC  . insulin glargine  15 Units Subcutaneous BID  . potassium chloride  20 mEq Oral Once  . saccharomyces boulardii  250 mg Oral BID  . sertraline  100 mg  Oral Daily  . traZODone  100 mg Oral QHS   Continuous Infusions: . sodium chloride 10 mL/hr at 09/20/14 2102   PRN Meds:.acetaminophen **OR** [DISCONTINUED] acetaminophen, dextrose, [DISCONTINUED] ondansetron **OR** ondansetron (ZOFRAN) IV, oxyCODONE, senna-docusate  Antibiotics     Anti-infectives    Start     Dose/Rate Route Frequency Ordered Stop   09/21/14 1000  vancomycin (VANCOCIN) IVPB 750 mg/150 ml premix     750 mg 150 mL/hr over 60 Minutes Intravenous 2 times daily 09/21/14 0855 09/21/14 2329   09/16/14 1500  ceFEPIme (MAXIPIME) 1 g in dextrose 5 % 50 mL IVPB     1 g 100 mL/hr over 30 Minutes Intravenous 3 times per day 09/16/14 1300 09/21/14 2259   09/16/14 1400  metroNIDAZOLE (FLAGYL) IVPB 500 mg  Status:  Discontinued     500 mg 100 mL/hr over 60 Minutes Intravenous 3 times per day 09/16/14 1219 09/22/14 0735   09/16/14 1400  vancomycin (VANCOCIN) IVPB 1000 mg/200 mL premix  Status:  Discontinued     1,000 mg 200 mL/hr over 60 Minutes Intravenous 2 times daily 09/16/14 1300 09/21/14 0855   09/16/14 0000  vancomycin (VANCOCIN) 50 mg/mL oral solution 125 mg  Status:  Discontinued     125 mg Oral 4 times per day 09/15/14 2101 09/22/14 0735   09/15/14 2200  doxycycline (VIBRA-TABS) tablet 100 mg  Status:  Discontinued     100 mg Oral Every 12 hours 09/15/14 2101 09/16/14 1219        Objective:   Filed Vitals:   09/23/14 0600 09/23/14 0613 09/23/14 0912 09/23/14 1000  BP: 120/76 119/77 100/62 120/70  Pulse: 60 74 71 68  Temp: 99 F (37.2 C) 98.6 F (37 C) 99 F (37.2 C) 98.8 F (37.1 C)  TempSrc: Oral Oral Oral Oral  Resp: 18 16 16 18   Height:      Weight:      SpO2: 96% 95% 99% 98%    Wt Readings from Last 3 Encounters:  09/22/14 75.751 kg (167 lb)  09/03/14 85.73 kg (189 lb)  07/24/14 74.844 kg (165 lb)     Intake/Output Summary (Last 24 hours) at 09/23/14 1249 Last data filed at 09/23/14 0930  Gross per 24 hour  Intake   1080 ml  Output   1700  ml  Net   -620 ml     Physical Exam  Awake Alert, Oriented X 3, No new F.N deficits, Normal affect Tyler Burgess.AT,PERRAL Supple Neck,No JVD, No cervical lymphadenopathy appriciated.  Symmetrical Chest wall movement, Good air movement bilaterally, CTAB RRR,No Gallops,Rubs or new Murmurs, No Parasternal Heave +ve B.Sounds, Abd Soft, No tenderness, No organomegaly appriciated, No rebound - guarding or rigidity. No Cyanosis, Clubbing or edema, No new Rash or bruise  Right foot under bandage post transmetatarsal amputation.   Data Review   Micro Results Recent Results (from the past 240 hour(s))  Surgical pcr screen     Status: None   Collection Time: 09/20/14  4:43 AM  Result Value Ref Range Status   MRSA, PCR NEGATIVE NEGATIVE Final   Staphylococcus aureus NEGATIVE NEGATIVE Final    Comment:  The Xpert SA Assay (FDA approved for NASAL specimens in patients over 62 years of age), is one component of a comprehensive surveillance program.  Test performance has been validated by [facility] for patients greater than or equal to 36 year old. It is not intended to diagnose infection nor to guide or monitor treatment.     Radiology Reports    Dg Foot 2 Views Right  09/16/2014   CLINICAL DATA:  Osteomyelitis.  EXAM: RIGHT FOOT - 2 VIEW  COMPARISON:  08/31/2014  FINDINGS: Interval resection of the first and second toe. There is now cortical irregularity of the distal first metatarsal consistent with osteomyelitis. Soft tissue air noted in the surgical bed about the second digit. No additional site concerning for osteomyelitis.  IMPRESSION: Post resection of the first and second toes. 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   Electronically Signed   By: Rubye Oaks M.D.   On: 09/16/2014 02:48        CBC  Recent Labs Lab 09/17/14 0523 09/18/14 0526 09/21/14 0759  WBC 9.7 7.2 10.4  HGB 10.3* 10.7* 9.5*  HCT 30.6* 33.3* 30.5*  PLT 188 162  169  MCV 94.4 96.2 99.3  MCH 31.8 30.9 30.9  MCHC 33.7 32.1 31.1  RDW 13.0 13.4 14.0    Chemistries   Recent Labs Lab 09/17/14 0523 09/18/14 0526 09/18/14 0845 09/19/14 0525 09/21/14 0759 09/22/14 0522  NA 137 136  --  136 137  --   K 4.0 4.8  --  3.9 4.3  --   CL 104 102  --  101 100*  --   CO2 26 26  --  29 30  --   GLUCOSE 131* 151*  --  180* 153*  --   BUN 12 12  --  12 14  --   CREATININE 0.91 1.02 1.01 1.00 1.20 1.41*  CALCIUM 8.3* 8.7*  --  8.6* 8.3*  --   MG 1.6*  --   --  1.8 1.6* 2.0   ------------------------------------------------------------------------------------------------------------------ estimated creatinine clearance is 59.7 mL/min (by C-G formula based on Cr of 1.41). ------------------------------------------------------------------------------------------------------------------ No results for input(s): HGBA1C in the last 72 hours. ------------------------------------------------------------------------------------------------------------------ No results for input(s): CHOL, HDL, LDLCALC, TRIG, CHOLHDL, LDLDIRECT in the last 72 hours. ------------------------------------------------------------------------------------------------------------------ No results for input(s): TSH, T4TOTAL, T3FREE, THYROIDAB in the last 72 hours.  Invalid input(s): FREET3 ------------------------------------------------------------------------------------------------------------------ No results for input(s): VITAMINB12, FOLATE, FERRITIN, TIBC, IRON, RETICCTPCT in the last 72 hours.  Coagulation profile No results for input(s): INR, PROTIME in the last 168 hours.  No results for input(s): DDIMER in the last 72 hours.  Cardiac Enzymes No results for input(s): CKMB, TROPONINI, MYOGLOBIN in the last 168 hours.  Invalid input(s): CK ------------------------------------------------------------------------------------------------------------------ Invalid input(s):  POCBNP   Time Spent in minutes   35 minutes   Kache Mcclurg A M.D on 09/23/2014 at 12:49 PM  Between 7am to 7pm - Pager - [phone]  After 7pm go to [email or web address] - password [facility]  [facility]   Office  [phone]

## 2014-09-23 NOTE — Progress Notes (Signed)
Hypoglycemic Event  CBG: 60  Treatment: 15 GM carbohydrate snack  Symptoms: None  Follow-up CBG: Time:2245 CBG Result:89  Possible Reasons for Event: Unknown  Comments/MD notified:yes     Janee Mornhompson, Inf[facility] Jeff[facility] E  Remember to initiate Hypoglycemia Order Set & complete

## 2014-09-23 NOTE — Progress Notes (Signed)
STROKE TEAM PROGRESS NOTE   HISTORY Tyler LoganBilly Burgess is an 59 y.o. 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 Uneventful hospital stay until patient woke up from a nap on 5/28 and noted inability to use his right hand. LSW noon, woke up with weakness around 1730. Per RN/primary team upon there evaluation there was also a notable left facial droop and question of slurred speech. Facial droop and speech has improved but he continues to have weakness in his RUE. He feels the whole RUE is involved but it is most pronounced from the wrist down.   No prior stroke or TIA history. CT head imaging reviewed and no acute process. Labs pertinent for hemoglobin A1c of 13. He has been evaluated by psychiatry during this hospital stay for suicidal ideation, recommended psychiatry inpatient placement once medically stable.   Of note, patient was taking a nap in a chair prior to symptom onset. He believes his right arm may have been draped over the edge of the chair hanging down.   Date last known well: 09/22/2014 Time last known well: 1200 tPA Given: no, outside IV tPA window, recent surgery   SUBJECTIVE (INTERVAL HISTORY) Multiple family members present. History and exam consistent with radial nerve palsy.   OBJECTIVE Temp:  [98 F (36.7 C)-99.3 F (37.4 C)] 99 F (37.2 C) (05/29 0600) Pulse Rate:  [51-85] 60 (05/29 0600) Cardiac Rhythm:  [-] Normal sinus rhythm (05/28 2200) Resp:  [16-20] 18 (05/29 0600) BP: (90-125)/(56-76) 120/76 mmHg (05/29 0600) SpO2:  [95 %-99 %] 96 % (05/29 0600) Weight:  [75.751 kg (167 lb)] 75.751 kg (167 lb) (05/28 2156)   Recent Labs Lab 09/22/14 1134 09/22/14 1225 09/22/14 1653 09/22/14 2233 09/23/14 0631  GLUCAP 66 86 92 264* 95    Recent Labs Lab 09/16/14 0934 09/17/14 0523 09/18/14 0526 09/18/14 0845 09/19/14 0525 09/21/14 0759 09/22/14 0522  NA 133* 137 136   --  136 137  --   K 3.5 4.0 4.8  --  3.9 4.3  --   CL 102 104 102  --  101 100*  --   CO2 25 26 26   --  29 30  --   GLUCOSE 253* 131* 151*  --  180* 153*  --   BUN 11 12 12   --  12 14  --   CREATININE 0.86 0.91 1.02 1.01 1.00 1.20 1.41*  CALCIUM 7.8* 8.3* 8.7*  --  8.6* 8.3*  --   MG  --  1.6*  --   --  1.8 1.6* 2.0   No results for input(s): AST, ALT, ALKPHOS, BILITOT, PROT, ALBUMIN in the last 168 hours.  Recent Labs Lab 09/17/14 0523 09/18/14 0526 09/21/14 0759  WBC 9.7 7.2 10.4  HGB 10.3* 10.7* 9.5*  HCT 30.6* 33.3* 30.5*  MCV 94.4 96.2 99.3  PLT 188 162 169    Recent Labs Lab 09/16/14 0934  TROPONINI <0.03   No results for input(s): LABPROT, INR in the last 72 hours. No results for input(s): COLORURINE, LABSPEC, PHURINE, GLUCOSEU, HGBUR, BILIRUBINUR, KETONESUR, PROTEINUR, UROBILINOGEN, NITRITE, LEUKOCYTESUR in the last 72 hours.  Invalid input(s): APPERANCEUR     Component Value Date/Time   CHOL 120 04/17/2014 2009   TRIG 61 04/17/2014 2009   HDL 58 04/17/2014 2009   CHOLHDL 2.1 04/17/2014 2009   VLDL 12 04/17/2014 2009   LDLCALC 50 04/17/2014 2009   Lab  Results  Component Value Date   HGBA1C 13.0* 09/16/2014      Component Value Date/Time   LABOPIA NONE DETECTED 09/15/2014 1650   COCAINSCRNUR NONE DETECTED 09/15/2014 1650   LABBENZ NONE DETECTED 09/15/2014 1650   AMPHETMU NONE DETECTED 09/15/2014 1650   THCU NONE DETECTED 09/15/2014 1650   LABBARB NONE DETECTED 09/15/2014 1650    No results for input(s): ETH in the last 168 hours.  Ct Head Wo Contrast 09/22/2014    1. No acute intracranial pathology seen on CT.  2. Mild cortical volume loss and scattered small vessel ischemic microangiopathy.      Mr Brain Wo Contrast 09/23/2014    No acute intracranial process; specifically no acute ischemia.  Mild global parenchymal brain volume loss for age.      PHYSICAL EXAM Middle aged caucasian male not in distress. Rt foot bandaged. . Afebrile. Head is  nontraumatic. Neck is supple without bruit.    Cardiac exam no murmur or gallop. Lungs are clear to auscultation. Distal pulses are well felt except in right leg.Marland Kitchen  Neurological Exam ;  Awake  Alert oriented x 3. Normal speech and language.eye movements full without nystagmus.fundi were not visualized. Vision acuity and fields appear normal. Hearing is normal. Palatal movements are normal. Face symmetric. Tongue midline. Normal strength, tone, reflexes and coordination except righ hand weakness of wrist extensors only with wrist drop.. Normal sensation. Gait deferred.   ASSESSMENT/PLAN Mr. Tyler Burgess is a 59 y.o. male with history of diabetes mellitus and depression with suicidal ideation presenting with right hand weakness. He did not receive IV t-PA due to late presentation.  Right Radial nerve palsy  Resultant  right wrist extensor weakness likely from Saturday night palsy with right radial nerve injury  MRI  no acute process  MRA  not performed  Carotid Doppler  not performed  2D Echo  not performed  LDL not performed  HgbA1c 04.5  Lovenox for VTE prophylaxis  Diet heart healthy/carb modified Room service appropriate?: Yes; Fluid consistency:: Thin  no antithrombotic prior to admission, now on aspirin 81 mg orally every day  Patient counseled to be compliant with his antithrombotic medications  Ongoing aggressive stroke risk factor management  Therapy recommendations:  Skilled nursing facility or behavioral health   Disposition:  Pending  Diabetes mellitus  Hemoglobin A1c - 13.0  Uncontrolled  Other stroke risk factors  Tobacco use  Alcohol use   Other Active Problems  Anemia  Renal insufficiency  No MRA - elevated creatinine - check TCD's (ordered)  Stroke/TIA ruled out  Consider change to Plavix    Hospital day # 8  Delton See PA-C [facility] Pager [phone] 09/23/2014, 5:28 PM I have personally examined this  patient, reviewed notes, independently viewed imaging studies, participated in medical decision making and plan of care. I have made any additions or clarifications directly to the above note. Agree with note above. /He presented with overnight right wrist extensor weakness after sleeping in a awkward position in a chair and facial asymmetry is likely chronic and speech disturbance was very questionable  Hence his hand weakness likely is radial nerve entrapment in shoulder but he does have vascular risk factors for stroke hence he remains at risk for neurological worsening, recurrent stroke/TIA and needs on going stroke evaluation and agrressive riskk factor modification. D/w dr Anselm Jungling, MD Medical Director [facility] Pager: [phone] 09/23/2014 5:52 PM     To contact Stroke Continuity provider,  please refer to [email or web address] After hours, contact General Neurology

## 2014-09-23 NOTE — Evaluation (Signed)
Physical Therapy Re-Evaluation Patient Details Name: Tyler Burgess MRN: [id number] DOB: 12/20/1955 Today's Date: 09/23/2014   History of Present Illness  59 yo male adm 09/15/14 with DKA, [facility]rays reveal osteo and orthjo consult pending; PMHx:  depression, DM, psych d/o with HI adn SI.  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 Pt with incident of slurred speech and right UE weakness on 09/22/14, [facility], MRI neg for acute stroke.  Clinical Impression  Pt admitted with above diagnosis. Pt currently with functional limitations due to the deficits listed below (see PT Problem List). Combination of TDWB status RLE and new RUE weakness is significantly limiting safe mobility for pt. Pt will benefit from skilled PT to increase their independence and safety with mobility to allow discharge to the venue listed below.       Follow Up Recommendations SNF, or Behavioral Health    Equipment Recommendations  None recommended by PT (has received RW and is in room)    Recommendations for Other Services OT consult     Precautions / Restrictions Precautions Precautions: Fall Required Braces or Orthoses: Other Brace/Splint Other Brace/Splint: R post op shoe Restrictions Weight Bearing Restrictions: Yes RLE Weight Bearing: Touchdown weight bearing      Mobility  Bed Mobility Overal bed mobility: Independent             General bed mobility comments: pt able to get right leg off bed and sit up and edge without physical assist, also able to return to supine and gets legs into bed  Transfers Overall transfer level: Needs assistance Equipment used: Rolling walker (2 wheeled) Transfers: Sit to/from Stand Sit to Stand: Min guard         General transfer comment: slow, painful mvmt, cues for limiting wt on RLE, post op shoe and left regular shoe donned before standing. Pt had some difficulty placing right hand on RW and gripping handle but once placed was  able to bear wt effectively through rigth UE to keep RLE TDWB  Ambulation/Gait Ambulation/Gait assistance: Min guard Ambulation Distance (Feet): 80 Feet Assistive device: Rolling walker (2 wheeled) Gait Pattern/deviations: Step-to pattern Gait velocity: decr Gait velocity interpretation: <1.8 ft/sec, indicative of risk for recurrent falls General Gait Details: pt began having trouble continuing to WB through RUE with RW after about 50' making gait more unsteady and WB status more difficult to keep. Will need w/c for community distance mobility  Stairs            Wheelchair Mobility    Modified Rankin (Stroke Patients Only)       Balance Overall balance assessment: No apparent balance deficits (not formally assessed)                                           Pertinent Vitals/Pain Pain Assessment: 0-10 Pain Score: 7  Pain Location: right foot Pain Descriptors / Indicators: Burning Pain Intervention(s): Monitored during session;Limited activity within patient's tolerance    Home Living Family/patient expects to be discharged to:: Shelter/Homeless Living Arrangements: Alone               Additional Comments: pt to d/c to behavioral health    Prior Function Level of Independence: Independent               Hand Dominance   Dominant Hand: Right    Extremity/Trunk Assessment  Upper Extremity Assessment: Defer to OT evaluation;RUE deficits/detail RUE Deficits / Details: pt lacks wrist and finger extension on the left but flexion appears to be intact as well as strength at the elbow and shoulder. Pt had fatigue with WB'ing through the right UE on the RW after 50'. Spoke with MD who feels at this time that is likely diabetic neuritis.          Lower Extremity Assessment: RLE deficits/detail RLE Deficits / Details: transmet amp 09/20/14, TDWB, strength at right knee and hip appears to be [facility]    Cervical / Trunk Assessment: Normal   Communication   Communication: No difficulties  Cognition Arousal/Alertness: Awake/alert Behavior During Therapy: WFL for tasks assessed/performed Overall Cognitive Status: Within Functional Limits for tasks assessed                      General Comments General comments (skin integrity, edema, etc.): pt anxious about inability to use R hand functionally    Exercises Total Joint Exercises Ankle Circles/Pumps: AROM;Both;10 reps      Assessment/Plan    PT Assessment Patient needs continued PT services  PT Diagnosis Difficulty walking;Hemiplegia dominant side;Acute pain;Abnormality of gait   PT Problem List Decreased strength;Decreased activity tolerance;Decreased range of motion;Decreased mobility;Decreased coordination;Decreased knowledge of use of DME;Decreased knowledge of precautions;Pain  PT Treatment Interventions DME instruction;Gait training;Functional mobility training;Therapeutic activities;Therapeutic exercise;Balance training;Neuromuscular re-education;Patient/family education   PT Goals (Current goals can be found in the Care Plan section) Acute Rehab PT Goals Patient Stated Goal: get RUE function back PT Goal Formulation: With patient Time For Goal Achievement: 10/07/14 Potential to Achieve Goals: Good    Frequency Min 5X/week   Barriers to discharge Decreased caregiver support homeless    Co-evaluation               End of Session Equipment Utilized During Treatment: Gait belt Activity Tolerance: Patient tolerated treatment well Patient left: in bed;with call bell/phone within reach;with nursing/sitter in room Nurse Communication: Mobility status         Time: 1030-1100 PT Time Calculation (min) (ACUTE ONLY): 30 min   Charges:   PT Evaluation $PT Re-evaluation: 1 Procedure PT Treatments $Gait Training: 8-22 mins   PT G Codes:      Lyanne Co, PT  Acute Rehab Services  [phone]   Lyanne Co 09/23/2014, 1:43 PM

## 2014-09-24 ENCOUNTER — Inpatient Hospital Stay (HOSPITAL_COMMUNITY): Payer: Medicaid Other

## 2014-09-24 DIAGNOSIS — R531 Weakness: Secondary | ICD-10-CM

## 2014-09-24 DIAGNOSIS — F332 Major depressive disorder, recurrent severe without psychotic features: Secondary | ICD-10-CM

## 2014-09-24 LAB — GLUCOSE, CAPILLARY
GLUCOSE-CAPILLARY: 113 mg/dL — AB (ref 65–99)
GLUCOSE-CAPILLARY: 76 mg/dL (ref 65–99)
Glucose-Capillary: 88 mg/dL (ref 65–99)
Glucose-Capillary: 81 mg/dL (ref 65–99)
Glucose-Capillary: 295 mg/dL — ABNORMAL HIGH (ref 65–99)

## 2014-09-24 LAB — BASIC METABOLIC PANEL
Anion gap: 4 — ABNORMAL LOW (ref 5–15)
BUN: 14 mg/dL (ref 6–20)
CHLORIDE: 107 mmol/L (ref 101–111)
CO2: 26 mmol/L (ref 22–32)
Calcium: 8.4 mg/dL — ABNORMAL LOW (ref 8.9–10.3)
Creatinine, Ser: 1.32 mg/dL — ABNORMAL HIGH (ref 0.61–1.24)
GFR calc Af Amer: >60 mL/min (ref >60)
GFR, EST NON AFRICAN AMERICAN: 58 mL/min — AB (ref >60)
Glucose, Bld: 234 mg/dL — ABNORMAL HIGH (ref 65–99)
Potassium: 4.3 mmol/L (ref 3.5–5.1)
SODIUM: 137 mmol/L (ref 135–145)

## 2014-09-24 MED ORDER — OXYCODONE HCL 5 MG PO TABS
15.0000 mg | ORAL_TABLET | ORAL | Status: DC | PRN
  Administered 2014-09-24 – 2014-09-26 (×11): 15 mg via ORAL
  Filled 2014-09-24 (×11): qty 3

## 2014-09-24 MED ORDER — INSULIN GLARGINE 100 UNIT/ML ~~LOC~~ SOLN
25.0000 [IU] | Freq: Every day | SUBCUTANEOUS | Status: DC
  Administered 2014-09-24: 25 [IU] via SUBCUTANEOUS
  Filled 2014-09-24 (×2): qty 0.25

## 2014-09-24 MED ORDER — GABAPENTIN 300 MG PO CAPS
300.0000 mg | ORAL_CAPSULE | Freq: Three times a day (TID) | ORAL | Status: DC
  Administered 2014-09-24 – 2014-09-26 (×5): 300 mg via ORAL
  Filled 2014-09-24 (×5): qty 1

## 2014-09-24 NOTE — Clinical Social Work Note (Signed)
CSW reviewed chart. CSW notice Elray BubaRegina Kujawa[facility] L[facility]. CSW asked Rene KocherRegina to provided [facility] with a handoff. CSW provided Orr[place]ina with the number.   CSW attempted to follow-up with psych social worker to confirmed that she received a handoff from JoslinRegina. CSW left psych social worker a message.   Beren Yniguez, MSW, LCSWA [phone]

## 2014-09-24 NOTE — Evaluation (Signed)
Speech Language Pathology Evaluation Patient Details Name: Tyler Burgess MRN: [id number] DOB: 06-Apr-1956 Today's Date: 09/24/2014 Time: 4098-1191 SLP Time Calculation (min) (ACUTE ONLY): 16 min  Problem List:  Patient Active Problem List   Diagnosis Date Noted  . Weakness   . DKA (diabetic ketoacidoses) 09/15/2014  . Hyponatremia 09/15/2014  . DKA, type 2, not at goal 09/15/2014  . Suicidal ideations   . Diarrhea   . Enteritis due to Clostridium difficile 09/05/2014  . Osteomyelitis 08/31/2014  . Sepsis 08/31/2014  . Substance abuse   . Suicidal ideation   . Type 2 diabetes mellitus with hyperglycemia 04/15/2014  . Suicidal behavior 04/11/2014  . MDD (major depressive disorder), recurrent severe, without psychosis 04/11/2014  . GAD (generalized anxiety disorder) 04/11/2014  . Alcohol use disorder, severe, dependence 04/11/2014  . Opioid use disorder, moderate, in sustained remission 04/11/2014  . Opioid dependence in remission 01/10/2014  . Severe major depression without psychotic features 01/09/2014  . 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 Type 2 diabetes mellitus with peripheral neuropathy 12/11/2013  . Nonpsychotic mental disorder 12/11/2013   Past Medical History:  Past Medical History  Diagnosis Date  . Diabetes mellitus without complication   . Depression    Past Surgical History:  Past Surgical History  Procedure Laterality Date  . Right arm    . Cholecystectomy    . Amputation toe Right 09/03/2014    Procedure: AMPUTATION RIGHT GREAT TOE AND SECOND TOE;  Surgeon: Franky Macho Md, MD;  Location: [facility];  Service: General;  Laterality: Right;  right great and second toes  . Amputation Right 09/20/2014    Procedure: RIGHT TRANSMETATARSAL AMPUTATION;  Surgeon: Nadara Mustard, MD;  Location: [facility];  Service: Orthopedics;  Laterality: Right;   HPI:  Tyler Burgess is an 59 y.o. male history of DM admitted for a diabetic gangrene right foot and is s/p transmetatarsal  amputation on the right foot with surgery on 5/26. Uneventful hospital stay until patient woke up from a nap on 5/28 and noted inability to use his right hand. LSW noon, woke up with weakness around 1730. Per RN/primary team upon there evaluation there was also a notable left facial droop and question of slurred speech. Facial droop and speech has improved but he continues to have weakness in his RUE. He feels the whole RUE is involved but it is most pronounced from the wrist down. No prior stroke or TIA history. CT and MRI head show no acute process.    Assessment / Plan / Recommendation Clinical Impression  The Mini Mental State Exam was administed to assess the patient's cognitive skills.  The patient scored a 20/30.  Deficits noted for orientation, attention and  memory.  The patient reports that his memory skills have been poor for some time and that he relies on help from whomever is around to recall date.  Recommend ST to address cognitive deficits.      SLP Assessment  Patient needs continued Speech Lanaguage Pathology Services    Follow Up Recommendations    To be determined   Frequency and Duration min 2x/week  2 weeks   Pertinent Vitals/Pain Pain Assessment: 0-10 Pain Score: 8  Pain Location: right foot Pain Intervention(s): Patient requesting pain meds-RN notified   SLP Goals  Patient/Family Stated Goal: none available Potential to Achieve Goals (ACUTE ONLY): Fair Potential Considerations (ACUTE ONLY): Previous level of function  SLP Evaluation Prior Functioning  Cognitive/Linguistic Baseline: Within functional limits Type of Home: Homeless  Lives  With: Alone Available Help at Discharge:  (none)   Cognition  Overall Cognitive Status: Impaired/Different from baseline Arousal/Alertness: Awake/alert Orientation Level: Oriented to person;Oriented to situation;Disoriented to time;Disoriented to place Attention: Sustained Sustained Attention: Impaired Sustained Attention  Impairment: Verbal basic Memory: Impaired Memory Impairment: Decreased recall of new information;Storage deficit Awareness: Appears intact Problem Solving: Appears intact Safety/Judgment: Appears intact    Comprehension  Auditory Comprehension Overall Auditory Comprehension: Appears within functional limits for tasks assessed Commands: Within Functional Limits Conversation: Simple Reading Comprehension Reading Status: Within funtional limits    Expression Expression Primary Mode of Expression: Verbal Verbal Expression Overall Verbal Expression: Appears within functional limits for tasks assessed Initiation: No impairment Automatic Speech: Name;Social Response Level of Generative/Spontaneous Verbalization: Conversation Naming: No impairment Pragmatics: No impairment Non-Verbal Means of Communication: Not applicable Written Expression Dominant Hand: Right Written Expression: Within Functional Limits   Oral / Motor Motor Speech Overall Motor Speech: Appears within functional limits for tasks assessed Respiration: Within functional limits Phonation: Normal Resonance: Within functional limits Articulation: Within functional limitis Intelligibility: Intelligible Motor Planning: Witnin functional limits Motor Speech Errors: Not applicable   GO     Dimas AguasMelissa Karlena Luebke, MA, CCC-SLP Acute Rehab SLP [phone]'
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
## 2014-09-24 NOTE — Progress Notes (Signed)
Transcranial Doppler has been completed.  Farrel DemarkJill Eunice, RDMS, RVT 09/24/2014

## 2014-09-24 NOTE — Progress Notes (Signed)
Patient Demographics Tyler Burgess, is a 59 y.o. male, DOB - 07/26/1955, ZOX:[id number] Admit date - 09/15/2014   Admitting Physician Therisa Doyne, MD Outpatient Primary MD for the patient is No PCP Per Patient LOS - 9  Summary  This is a 59 year old Caucasian male with history of anxiety depression, smoking, type 2 diabetes mellitus with diabetic neuropathy, diabetes in poor control, homeless. Who had right foot diabetic gangrene and underwent resection few weeks ago by Dr. Marlyne Beards in general surgeon at [facility]. He subsequently did not take his anti-biotics postop as he was required to, he developed suicidal ideation and was brought by his brother for admission to behavioral Hospital. He was found to have right foot postop infection along with HHS. His HHS was treated and fixed with IV insulin per protocol, he was seen by Dr. 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 He has finished his IV anti-biotics. He waits placement to either rehabilitation or to [facility]. Psych has seen him has discontinued sitter.   Subjective:   No new complaints, no improvement since yesterday. Still has wristdrop with no control over extension of the right wrist/fingers..   Assessment & Plan   Hyperosmolar hyperglycemic state Presented to the hospital with blood sugar of 1016, very mild acidosis with bicarbonate of 20. Findings consistent with hyperosmolar hyperglycemic state rather than DKA. Patient treated with aggressive hydration with IV fluids and intensive intravenous insulin therapy. Blood sugars controlled now.  Wristdrop Patient developed wrist drop and inability to extend his right fingers/wrist. MRI of the brain showed no acute events, physical exam showed right wrist drop. This  is could be secondary to radial mononeuropathy from pressure from awkward position while sleeping or secondary to diabetic mononeuritis multiplex Occupational therapy to evaluate.  Uncontrolled diabetes mellitus type 2 Hemoglobin A1c is 13.0, this correlate with mean plasma glucose of 326. Patient was not using his insulin for some time secondary to financial and compliance issues. Lantus decreased to 25 units at night, continue 5 prandial units of NovoLog.  Osetomylitis right foot Recent right first and second toe amputation done in [facility] by Dr. Lovell Sheehan. Poor healing and infection, patient undergone right transmetatarsal amputation on 5/16. Antibiotics discontinued on 5/28.  Suicidal and homicidal ideations Initially on admission patient had intense suicidal/homicidal ideation, this is resolved. Psych evaluated the patient, patient will be discharged to behavioral health when clinically stable.  C. Difficile recent Stool is now solid, clinically infection resolved.  Mild ARF Hydrate with IV fluids, check BMP in a.m.    Code Status: Full  Family Communication: None  Disposition Plan: [facility] versus rehabilitation, disposition will be decided by Psych and S Work.  Consults  Dr Lajoyce Corners, Dr. Pearlean Brownie   Procedures  post right transmetatarsal amputation on 09/20/2014 by Dr. Lajoyce Corners   DVT Prophylaxis  Lovenox    Lab Results  Component Value Date   PLT 169 09/21/2014    Medications  Scheduled Meds: . ARIPiprazole  5 mg Oral BID  . aspirin  81 mg Oral Daily  . benztropine  0.5 mg Oral BID  . enoxaparin (LOVENOX) injection  40 mg Subcutaneous Q24H  . gabapentin  200 mg Oral TID  . insulin aspart  0-15 Units Subcutaneous TID WC  .  insulin aspart  5 Units Subcutaneous TID WC  . insulin glargine  25 Units Subcutaneous QHS  . saccharomyces boulardii  250 mg Oral BID  . sertraline  100 mg Oral Daily  . traZODone  100 mg Oral QHS   Continuous Infusions: . sodium chloride 75 mL/hr  at 09/24/14 0005   PRN Meds:.acetaminophen **OR** [DISCONTINUED] acetaminophen, dextrose, [DISCONTINUED] ondansetron **OR** ondansetron (ZOFRAN) IV, oxyCODONE, senna-docusate  Antibiotics     Anti-infectives    Start     Dose/Rate Route Frequency Ordered Stop   09/21/14 1000  vancomycin (VANCOCIN) IVPB 750 mg/150 ml premix     750 mg 150 mL/hr over 60 Minutes Intravenous 2 times daily 09/21/14 0855 09/21/14 2329   09/16/14 1500  ceFEPIme (MAXIPIME) 1 g in dextrose 5 % 50 mL IVPB     1 g 100 mL/hr over 30 Minutes Intravenous 3 times per day 09/16/14 1300 09/21/14 2259   09/16/14 1400  metroNIDAZOLE (FLAGYL) IVPB 500 mg  Status:  Discontinued     500 mg 100 mL/hr over 60 Minutes Intravenous 3 times per day 09/16/14 1219 09/22/14 0735   09/16/14 1400  vancomycin (VANCOCIN) IVPB 1000 mg/200 mL premix  Status:  Discontinued     1,000 mg 200 mL/hr over 60 Minutes Intravenous 2 times daily 09/16/14 1300 09/21/14 0855   09/16/14 0000  vancomycin (VANCOCIN) 50 mg/mL oral solution 125 mg  Status:  Discontinued     125 mg Oral 4 times per day 09/15/14 2101 09/22/14 0735   09/15/14 2200  doxycycline (VIBRA-TABS) tablet 100 mg  Status:  Discontinued     100 mg Oral Every 12 hours 09/15/14 2101 09/16/14 1219        Objective:   Filed Vitals:   09/23/14 2100 09/24/14 0235 09/24/14 0533 09/24/14 1014  BP: 111/73 120/72 106/68 109/66  Pulse: 61 64 58 61  Temp: 99.1 F (37.3 C) 99.2 F (37.3 C) 98.2 F (36.8 C) 98.1 F (36.7 C)  TempSrc: Oral Oral Oral Oral  Resp: 20 20 20 20   Height:      Weight:      SpO2: 100% 99% 99% 100%    Wt Readings from Last 3 Encounters:  09/22/14 75.751 kg (167 lb)  09/03/14 85.73 kg (189 lb)  07/24/14 74.844 kg (165 lb)     Intake/Output Summary (Last 24 hours) at 09/24/14 1232 Last data filed at 09/24/14 0900  Gross per 24 hour  Intake   2160 ml  Output   2550 ml  Net   -390 ml     Physical Exam  Awake Alert, Oriented X 3, No new F.N  deficits, Normal affect [place].AT,PERRAL Supple Neck,No JVD, No cervical lymphadenopathy appriciated.  Symmetrical Chest wall movement, Good air movement bilaterally, CTAB RRR,No Gallops,Rubs or new Murmurs, No Parasternal Heave +ve B.Sounds, Abd Soft, No tenderness, No organomegaly appriciated, No rebound - guarding or rigidity. No Cyanosis, Clubbing or edema, No new Rash or bruise  Right foot under bandage post transmetatarsal amputation.   Data Review   Micro Results Recent Results (from the past 240 hour(s))  Surgical pcr screen     Status: None   Collection Time: 09/20/14  4:43 AM  Result Value Ref Range Status   MRSA, PCR NEGATIVE NEGATIVE Final   Staphylococcus aureus NEGATIVE NEGATIVE Final    Comment:        The Xpert SA Assay (FDA approved for NASAL specimens in patients over 57 years of age), is one component of a  comprehensive surveillance program.  Test performance has been validated by [facility] for patients greater than or equal to 75 year old. It is not intended to diagnose infection nor to guide or monitor treatment.     Radiology Reports    Dg Foot 2 Views Right  09/16/2014   CLINICAL DATA:  Osteomyelitis.  EXAM: RIGHT FOOT - 2 VIEW  COMPARISON:  08/31/2014  FINDINGS: Interval resection of the first and second toe. There is now cortical irregularity of the distal first metatarsal consistent with osteomyelitis. Soft tissue air noted in the surgical bed about the second digit. No additional site concerning for osteomyelitis.  IMPRESSION: Post resection of the first and second toes. 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   Electronically Signed   By: Rubye Oaks M.D.   On: 09/16/2014 02:48        CBC  Recent Labs Lab 09/18/14 0526 09/21/14 0759  WBC 7.2 10.4  HGB 10.7* 9.5*  HCT 33.3* 30.5*  PLT 162 169  MCV 96.2 99.3  MCH 30.9 30.9  MCHC 32.1 31.1  RDW 13.4 14.0    Chemistries   Recent Labs Lab  09/18/14 0526 09/18/14 0845 09/19/14 0525 09/21/14 0759 09/22/14 0522  NA 136  --  136 137  --   K 4.8  --  3.9 4.3  --   CL 102  --  101 100*  --   CO2 26  --  29 30  --   GLUCOSE 151*  --  180* 153*  --   BUN 12  --  12 14  --   CREATININE 1.02 1.01 1.00 1.20 1.41*  CALCIUM 8.7*  --  8.6* 8.3*  --   MG  --   --  1.8 1.6* 2.0   ------------------------------------------------------------------------------------------------------------------ estimated creatinine clearance is 59.7 mL/min (by C-G formula based on Cr of 1.41). ------------------------------------------------------------------------------------------------------------------ No results for input(s): HGBA1C in the last 72 hours. ------------------------------------------------------------------------------------------------------------------ No results for input(s): CHOL, HDL, LDLCALC, TRIG, CHOLHDL, LDLDIRECT in the last 72 hours. ------------------------------------------------------------------------------------------------------------------ No results for input(s): TSH, T4TOTAL, T3FREE, THYROIDAB in the last 72 hours.  Invalid input(s): FREET3 ------------------------------------------------------------------------------------------------------------------ No results for input(s): VITAMINB12, FOLATE, FERRITIN, TIBC, IRON, RETICCTPCT in the last 72 hours.  Coagulation profile No results for input(s): INR, PROTIME in the last 168 hours.  No results for input(s): DDIMER in the last 72 hours.  Cardiac Enzymes No results for input(s): CKMB, TROPONINI, MYOGLOBIN in the last 168 hours.  Invalid input(s): CK ------------------------------------------------------------------------------------------------------------------ Invalid input(s): POCBNP   Time Spent in minutes   35 minutes   Tyler Burgess A M.D on 09/24/2014 at 12:32 PM  Between 7am to 7pm - Pager - [phone]  After 7pm go to [email or web address] - password  [email or web address]  [facility]   Office  [phone]

## 2014-09-24 NOTE — Progress Notes (Signed)
SLP Cancellation Note  Patient Details Name: Tyler LoganBilly Stiner MRN: [id number] DOB: 08/19/1955   Cancelled treatment:       Reason Eval/Treat Not Completed: Patient at procedure or test/unavailable   Dimas AguasMelissa Kaliann Coryell, MA, CCC-SLP Acute Rehab SLP [phone]

## 2014-09-25 ENCOUNTER — Inpatient Hospital Stay (HOSPITAL_COMMUNITY): Payer: Medicaid Other

## 2014-09-25 LAB — URINALYSIS, ROUTINE W REFLEX MICROSCOPIC
Bilirubin Urine: NEGATIVE
Glucose, UA: NEGATIVE mg/dL
Ketones, ur: NEGATIVE mg/dL
Leukocytes, UA: NEGATIVE
Nitrite: NEGATIVE
Protein, ur: NEGATIVE mg/dL
Specific Gravity, Urine: 1.01 (ref 1.005–1.030)
Urobilinogen, UA: 0.2 mg/dL (ref 0.0–1.0)
pH: 5.5 (ref 5.0–8.0)

## 2014-09-25 LAB — GLUCOSE, CAPILLARY
GLUCOSE-CAPILLARY: 63 mg/dL — AB (ref 65–99)
GLUCOSE-CAPILLARY: 71 mg/dL (ref 65–99)
Glucose-Capillary: 110 mg/dL — ABNORMAL HIGH (ref 65–99)
Glucose-Capillary: 68 mg/dL (ref 65–99)
Glucose-Capillary: 131 mg/dL — ABNORMAL HIGH (ref 65–99)
Glucose-Capillary: 120 mg/dL — ABNORMAL HIGH (ref 65–99)
Glucose-Capillary: 94 mg/dL (ref 65–99)

## 2014-09-25 LAB — BASIC METABOLIC PANEL
Anion gap: 10 (ref 5–15)
BUN: 13 mg/dL (ref 6–20)
CO2: 21 mmol/L — ABNORMAL LOW (ref 22–32)
Calcium: 8.3 mg/dL — ABNORMAL LOW (ref 8.9–10.3)
Chloride: 106 mmol/L (ref 101–111)
Creatinine, Ser: 1.23 mg/dL (ref 0.61–1.24)
GFR calc non Af Amer: >60 mL/min (ref >60)
GLUCOSE: 67 mg/dL (ref 65–99)
POTASSIUM: 4.4 mmol/L (ref 3.5–5.1)
Sodium: 137 mmol/L (ref 135–145)

## 2014-09-25 LAB — URINE MICROSCOPIC-ADD ON

## 2014-09-25 MED ORDER — IBUPROFEN 200 MG PO TABS
800.0000 mg | ORAL_TABLET | Freq: Once | ORAL | Status: AC
  Administered 2014-09-25: 800 mg via ORAL
  Filled 2014-09-25: qty 4

## 2014-09-25 MED ORDER — SODIUM CHLORIDE 0.9 % IV BOLUS (SEPSIS)
500.0000 mL | Freq: Once | INTRAVENOUS | Status: AC
  Administered 2014-09-25: 500 mL via INTRAVENOUS

## 2014-09-25 MED ORDER — INSULIN GLARGINE 100 UNIT/ML ~~LOC~~ SOLN
20.0000 [IU] | Freq: Every day | SUBCUTANEOUS | Status: DC
  Administered 2014-09-25: 20 [IU] via SUBCUTANEOUS
  Filled 2014-09-25 (×2): qty 0.2

## 2014-09-25 MED ORDER — IBUPROFEN 200 MG PO TABS
400.0000 mg | ORAL_TABLET | Freq: Once | ORAL | Status: AC
  Administered 2014-09-25: 400 mg via ORAL
  Filled 2014-09-25: qty 2

## 2014-09-25 MED ORDER — DEXTROSE 50 % IV SOLN: 50.0000 mL | Freq: Once | INTRAVENOUS | Status: DC

## 2014-09-25 NOTE — Progress Notes (Signed)
Occupational Therapy Treatment Patient Details Name: Tyler Burgess MRN: [id number] DOB: 12/08/1955 Today's Date: 09/25/2014    History of present illness 59 yo male adm 09/15/14 with DKA, [facility], xrays reveal osteo and orthjo consult pending; PMHx:  depression, DM, psych d/o with HI adn SI.  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 Pt with incident of slurred speech and right UE weakness on 09/22/14, [facility], MRI neg for acute stroke.   OT comments  Assessed RUE this pm. Pt continues to demonstrate symptoms consistent with radial nerve palsy. Wrist extension appears to be improving with gravity eliminated, however, finger extension remains limited. Will fabricate radial nerve palsy splint to increase functional use of R hand. Also fit with wrist cock up splint for pt to use when ambulating @ RW level and when sleeping.   Follow Up Recommendations  SNF;Supervision/Assistance - 24 hour    Equipment Recommendations  Other (comment)    Recommendations for Other Services      Precautions / Restrictions Precautions Precautions: Fall Required Braces or Orthoses: Other Brace/Splint Other Brace/Splint: R post op shoe (R wrist cock up splint)                 Vision                     Perception     Praxis      Cognition   Behavior During Therapy: WFL for tasks assessed/performed Overall Cognitive Status: No family/caregiver present to determine baseline cognitive functioning (most likely baseline)                       Extremity/Trunk Assessment   RUE - symptoms consistent with apparent radial n palsy            Exercises  reviewed wrist extension exercises in neutral position   Shoulder Instructions  Pt painful over bicipital groove, Recommend icing shoulder and limited use of R shoulder/rest as able.     General Comments      Pertinent Vitals/ Pain       Pain Assessment: 0-10 Pain Score: 4  Pain Location: R ffot   Pain Descriptors / Indicators: Aching Pain Intervention(s): Limited activity within patient's tolerance  Home Living                                          Prior Functioning/Environment              Frequency Min 2X/week     Progress Toward Goals  OT Goals(current goals can now be found in the care plan section)  Progress towards OT goals: Progressing toward goals (goals added)  Acute Rehab OT Goals Patient Stated Goal: get RUE function back OT Goal Formulation: With patient Time For Goal Achievement: 10/06/14 Potential to Achieve Goals: Good ADL Goals Pt Will Perform Grooming: with min guard assist;standing Pt Will Perform Lower Body Bathing: with min guard assist;sit to/from stand Pt Will Perform Lower Body Dressing: with min guard assist;sit to/from stand Pt Will Transfer to Toilet: with min guard assist;ambulating;regular height toilet;grab bars Pt Will Perform Toileting - Clothing Manipulation and hygiene: with min guard assist;sit to/from stand Pt/caregiver will Perform Home Exercise Program: Increased ROM;Increased strength;Right Upper extremity;With written HEP provided Additional ADL Goal #1: Pt will be independent with splint wearing schedule.  Plan Discharge plan remains  appropriate    Co-evaluation                 End of Session     Activity Tolerance Patient tolerated treatment well   Patient Left in bed;with call bell/phone within reach   Nurse Communication Mobility status;Other (comment) (splinting)        Time: 1355-1415 OT Time Calculation (min): 20 min  Charges: OT General Charges $OT Visit: 1 Procedure OT Treatments $Self Care/Home Management : 8-22 mins  Tyler Burgess,Tyler Burgess 09/25/2014, 5:45 PM   [facility], OTR/L  [phone] 09/25/2014

## 2014-09-25 NOTE — Progress Notes (Signed)
Inpatient Diabetes Program Recommendations  AACE/ADA: New Consensus Statement on Inpatient Glycemic Control (2013)  Target Ranges:  Prepandial:   less than 140 mg/dL      Peak postprandial:   less than 180 mg/dL (1-2 hours)      Critically ill patients:  140 - 180 mg/dL    Results for Tyler Burgess, Tyler Burgess (MRN [id number]) as of 09/25/2014 11:30  Ref. Range 09/23/2014 06:31 09/23/2014 11:27 09/23/2014 16:59 09/23/2014 20:58 09/23/2014 22:29 09/23/2014 22:47  Glucose-Capillary Latest Ref Range: 65-99 mg/dL 95 409272 (H) 811156 (H) 72 60 (L) 89    Results for Tyler Burgess, Tyler Burgess (MRN [id number]) as of 09/25/2014 11:30  Ref. Range 09/24/2014 06:28 09/24/2014 11:33 09/24/2014 16:14 09/24/2014 21:55  Glucose-Capillary Latest Ref Range: 65-99 mg/dL 81 213295 (H) 086113 (H) 76    Results for Tyler Burgess, Tyler Burgess (MRN [id number]) as of 09/25/2014 11:30  Ref. Range 09/25/2014 00:11 09/25/2014 00:28 09/25/2014 01:04 09/25/2014 06:29  Glucose-Capillary Latest Ref Range: 65-99 mg/dL 63 (L) 68 528110 (H) 71     Current DM Orders: Lantus 25 units QHS            Novolog Moderate SSI            Novolog 5 units tidwc    **Patient having occasional episodes of Hypoglycemia.  **Eating 100% of meals.  **CBGs have been running a bit on the low side for several days now.  Note that Lantus was decreased to 25 units QHS last PM (patient received a total of 30 units on 05/29).     MD- Please consider the following in-hospital insulin adjustments:  1. Decrease Lantus to 20 units QHS  2. Decrease Novolog SSI to Sensitive Scale (currently ordered as Moderate scale)  3. Leave Novolog Meal Coverage at 5 units tidwc- Patient eating well    Will Follow Ambrose FinlandJeannine Johnston Andra Heslin RN, MSN, CDE Diabetes Coordinator Inpatient Diabetes Program Team Pager: [phone] (8a-5p)

## 2014-09-25 NOTE — Evaluation (Signed)
Occupational Therapy Evaluation Patient Details Name: Tyler Burgess MRN: [id number] DOB: [birth year] Today's Date: 09/25/2014    History of Present Illness 59 yo male adm 09/15/14 with DKA, recent great and second toe/TMA at [facility], xrays reveal osteo and orthjo consult pending; PMHx:  depression, DM, psych d/o with HI adn SI.  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 Pt with incident of slurred speech and right UE weakness on 09/22/14, transferred to [facility], MRI neg for acute stroke.   Clinical Impression   Pt admitted with the above diagnoses and presents with below problem list. Pt will benefit from continued OT to address the below listed deficits and maximize independence with BADLs. Pt presents with new RUE weakness, see details below, impacting functional tasks with eating/grooming and UB ADLs. 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 Pt would benefit from wrist cock-up splint for sleep and mobility as well as a dynamic radial nerve palsy splint. D/c plan to SNF remains appropriate.      Follow Up Recommendations  SNF;Supervision/Assistance - 24 hour    Equipment Recommendations  Other (comment) (defer to next venue)    Recommendations for Other Services       Precautions / Restrictions Precautions Precautions: Fall Required Braces or Orthoses: Other Brace/Splint Other Brace/Splint: R post op shoe Restrictions Weight Bearing Restrictions: Yes RLE Weight Bearing: Touchdown weight bearing Other Position/Activity Restrictions: TWB ordered post TMA      Mobility Bed Mobility Overal bed mobility: Independent             General bed mobility comments: Pt sitting EOB at start adn end of session.  Transfers Overall transfer level: Needs assistance Equipment used: Rolling walker (2 wheeled) Transfers: Sit to/from Stand Sit to Stand: Min guard         General transfer comment: minA for transfering up to crutches due to instability, min guard for transferring  up to RW    Balance Overall balance assessment: Needs assistance Sitting-balance support: No upper extremity supported;Feet supported Sitting balance-Leahy Scale: Good                                      ADL Overall ADL's : Needs assistance/impaired Eating/Feeding: Sitting;Minimal assistance   Grooming: Minimal assistance;Sitting   Upper Body Bathing: Minimal assitance;Sitting   Lower Body Bathing: Minimal assistance;Sit to/from stand   Upper Body Dressing : Minimal assistance;Sitting   Lower Body Dressing: Minimal assistance;Sit to/from stand   Toilet Transfer: Minimal assistance;Ambulation;Comfort height toilet;RW;Grab bars   Toileting- Clothing Manipulation and Hygiene: Minimal assistance;Sit to/from stand         General ADL Comments: Pt presents with new RUE weakness impacting functional tasks with eating/grooming and UB ADLs. Pt educated on PROM exercises and AROM in gravity eliminated position for rt wrist and finger extension.      Vision     Perception     Praxis      Pertinent Vitals/Pain Pain Assessment: Faces Pain Score: 5  Faces Pain Scale: Hurts little more Pain Location: rt shoulder Pain Descriptors / Indicators: Burning Pain Intervention(s): Monitored during session;Limited activity within patient's tolerance     Hand Dominance Right   Extremity/Trunk Assessment Upper Extremity Assessment Upper Extremity Assessment: RUE deficits/detail RUE Deficits / Details: Pt unable to extend rt wrist and perform composite extension against gravity. Pt is able to minimally extend wrist in gravity eliminated position. Pt able to initiate composite extension in  gravity eliminated but unable to complete functional range. 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 RUE Coordination: decreased fine motor;decreased gross motor   Lower Extremity Assessment Lower Extremity  Assessment: Defer to PT evaluation   Cervical / Trunk Assessment Cervical / Trunk Assessment: Normal   Communication Communication Communication: No difficulties   Cognition Arousal/Alertness: Awake/alert Behavior During Therapy: WFL for tasks assessed/performed Overall Cognitive Status: Within Functional Limits for tasks assessed Area of Impairment:  (recent suicidal ideations)                   General Comments       Exercises       Shoulder Instructions      Home Living Family/patient expects to be discharged to:: Shelter/Homeless Living Arrangements: Alone Available Help at Discharge: Other (Comment) (none) Type of Home: Homeless                              Lives With: Alone    Prior Functioning/Environment Level of Independence: Independent             OT Diagnosis: Generalized weakness;Other (comment) (RUE deficits)   OT Problem List: Decreased strength;Decreased knowledge of use of DME or AE;Impaired balance (sitting and/or standing);Decreased range of motion;Impaired UE functional use   OT Treatment/Interventions: Self-care/ADL training;Patient/family education;Therapeutic activities;DME and/or AE instruction;Therapeutic exercise;Splinting    OT Goals(Current goals can be found in the care plan section) Acute Rehab OT Goals Patient Stated Goal: get RUE function back OT Goal Formulation: With patient Time For Goal Achievement: 10/06/14 Potential to Achieve Goals: Good  OT Frequency: Min 2X/week   Barriers to D/C:            Co-evaluation              End of Session    Activity Tolerance: Patient tolerated treatment well Patient left: in bed;with call bell/phone within reach   Time: 1015-1032 OT Time Calculation (min): 17 min Charges:  OT General Charges $OT Visit: 1 Procedure OT Evaluation $Initial OT Evaluation Tier I: 1 Procedure G-Codes:    Pilar GrammesMathews, Alieah Brinton H 09/25/2014, 11:27 AM

## 2014-09-25 NOTE — Progress Notes (Signed)
Hypoglycemic Event  CBG: 63  Treatment: 15 GM carbohydrate snack  Symptoms: Shaky  Follow-up CBG: Time:0025 CBG Result:68   Possible Reasons for Event: Unknown  Comments/MD notified:yes    Tyler Burgess, [name]  Remember to initiate Hypoglycemia Order Set & complete

## 2014-09-25 NOTE — Progress Notes (Signed)
Occupational Therapy Treatment Patient Details Name: Tyler Burgess MRN: [id number] DOB: [birth year] Today's Date: 09/25/2014    History of present illness 59 yo male adm 09/15/14 with DKA, recent great and second toe/TMA at [facility], xrays reveal osteo and orthjo consult pending; PMHx:  depression, DM, psych d/o with HI adn SI.  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 Pt with incident of slurred speech and right UE weakness on 09/22/14, transferred to [facility], MRI neg for acute stroke.   OT comments  Pt seen for splint check. Pt appears to be tolerating splints without problems. Reviewed wearing schedule with pt. Will plan to see in am for further education on splints and HEP RUE.  Follow Up Recommendations  SNF;Supervision/Assistance - 24 hour    Equipment Recommendations  Other (comment)    Recommendations for Other Services      Precautions / Restrictions Precautions Precautions: Fall Required Braces or Orthoses: Other Brace/Splint (radial nerve palsy and wrist cock up splint) Other Brace/Splint: R post op shoe              ADL                                         General ADL Comments: Pt seen for splint check. Pt wearing wrist cock up splint. Pt staes he independently donned splint. splint removed. No redness noted. Pt states he did not notice any redness when he removed his radial n palsy splint                                      Cognition   Behavior During Therapy: WFL for tasks assessed/performed Overall Cognitive Status: No family/caregiver present to determine baseline cognitive functioning                                                 General Comments  Pt again expressed thanks for splints    Pertinent Vitals/ Pain       Pain Assessment: 0-10 Pain Score: 3  Pain Location: R foot Pain Descriptors / Indicators: Aching Pain Intervention(s): Limited activity within patient's tolerance  Home Living                                           Prior Functioning/Environment              Frequency Min 2X/week     Progress Toward Goals  OT Goals(current goals can now be found in the care plan section)  Progress towards OT goals: Progressing toward goals  Acute Rehab OT Goals Patient Stated Goal: get RUE function back OT Goal Formulation: With patient Time For Goal Achievement: 10/06/14 Potential to Achieve Goals: Good ADL Goals Pt Will Perform Grooming: with min guard assist;standing Pt Will Perform Lower Body Bathing: with min guard assist;sit to/from stand Pt Will Perform Lower Body Dressing: with min guard assist;sit to/from stand Pt Will Transfer to Toilet: with min guard assist;ambulating;regular height toilet;grab bars Pt Will Perform Toileting - Clothing Manipulation and hygiene: with min guard assist;sit to/from stand Pt/caregiver will Perform Home Exercise Program:  Increased ROM;Increased strength;Right Upper extremity;With written HEP provided Additional ADL Goal #1: Pt will be independent with splint wearing schedule. Additional ADL Goal #2: Pt will independently donn/doff radial nerve palsy splint and verbalize wearing schedule of both splints  Plan Discharge plan remains appropriate    Co-evaluation                 End of Session     Activity Tolerance Patient tolerated treatment well   Patient Left in bed;with call bell/phone within reach   Nurse Communication Other (comment) (splint schedule)        Time: [id number] OT Time Calculation (min): 11 min  Charges: OT General Charges $OT Visit: 1 Procedure OT Treatments  $Therapeutic Activity: 8-22 mins  Asiana Benninger,HILLARY 09/25/2014, 6:04 PM   [facility] [place] Tavone Caesar, OTR/L  [phone] 09/25/2014

## 2014-09-25 NOTE — Progress Notes (Signed)
Patient Demographics Tyler Burgess, is a 59 y.o. male, DOB - 12/26/1955, WUJ:[id number] Admit date - 09/15/2014   Admitting Physician Therisa Doyne, MD Outpatient Primary MD for the patient is No PCP Per Patient LOS - 10  Summary  This is a 59 year old Caucasian male with history of anxiety depression, smoking, type 2 diabetes mellitus with diabetic neuropathy, diabetes in poor control, homeless. Who had right foot diabetic gangrene and underwent resection few weeks ago by Dr. Marlyne Beards in general surgeon at [facility]. He subsequently did not take his anti-biotics postop as he was required to, he developed suicidal ideation and was brought by his brother for admission to behavioral Hospital. He was found to have right foot postop infection along with HHS. His HHS was treated and fixed with IV insulin per protocol, he was seen by Dr. 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 He has finished his IV anti-biotics. He waits placement to either rehabilitation or to [facility]. Psych has seen him has discontinued sitter.   Subjective:   Was okay yesterday but developed some pain around his amputated foot. I told him more than once he should elevated to release some pressure and congestion but he always dangling his leg from the bed. Nursing staff said the dressing is soaked with blood, I'll let Dr. Lajoyce Corners know to check on him. Was about to be discharged today, but developed fever last night.  Barriers to discharge: Developed fever last night, panculture obtained not on antibiotics.   Assessment & Plan   Hyperosmolar hyperglycemic state Presented to the hospital with blood sugar of 1016, very mild acidosis with bicarbonate of 20. Findings consistent with hyperosmolar hyperglycemic state  rather than DKA. Patient treated with aggressive hydration with IV fluids and intensive intravenous insulin therapy. Blood sugars controlled now.  Wristdrop Patient developed wrist drop and inability to extend his right fingers/wrist. MRI of the brain showed no acute events, physical exam showed right wrist drop. This is could be secondary to radial mononeuropathy from pressure from awkward position while sleeping or secondary to diabetic mononeuritis multiplex Occupational therapy to evaluate.  Fever Developed fever of 102.9 on 5/30 night, blood and urine cultures obtained, chest x-ray clear. In a.m. patient surprised that he had this high fever because he was not feeling anything. Appears to be very well no changes clinically, hold antibiotics.  Uncontrolled diabetes mellitus type 2 Hemoglobin A1c is 13.0, this correlate with mean plasma glucose of 326. Patient was not using his insulin for some time secondary to financial and compliance issues. Lantus decreased to 25 units at night, but still developed blood sugar of 60 7 in the morning, decreased to 20 units at night.  Osetomylitis right foot Recent right first and second toe amputation done in [facility] by Dr. Lovell Sheehan. Poor healing and infection, patient undergone right transmetatarsal amputation on 5/16. Antibiotics discontinued on 5/28. Bloodsoaked dressings per RN, Dr. Lajoyce Corners to evaluate prior to discharge.  Suicidal and homicidal ideations Initially on admission patient had intense suicidal/homicidal ideation, this is resolved. Psych evaluated the patient, patient will be discharged to behavioral health when clinically stable.  C. Difficile recent Stool is now solid, clinically infection resolved.  Mild ARF Hydrate with IV fluids, check BMP in a.m.  Code Status: Full  Family Communication: None  Disposition Plan: SNF, likely in a.m. if no changes clinically.  Consults  Dr Lajoyce Corners, Dr. Pearlean Brownie   Procedures  post  right transmetatarsal amputation on 09/20/2014 by Dr. Lajoyce Corners   DVT Prophylaxis  Lovenox    Lab Results  Component Value Date   PLT 169 09/21/2014    Medications  Scheduled Meds: . ARIPiprazole  5 mg Oral BID  . aspirin  81 mg Oral Daily  . benztropine  0.5 mg Oral BID  . dextrose  50 mL Intravenous Once  . enoxaparin (LOVENOX) injection  40 mg Subcutaneous Q24H  . gabapentin  300 mg Oral TID  . insulin aspart  0-15 Units Subcutaneous TID WC  . insulin aspart  5 Units Subcutaneous TID WC  . insulin glargine  25 Units Subcutaneous QHS  . saccharomyces boulardii  250 mg Oral BID  . sertraline  100 mg Oral Daily  . traZODone  100 mg Oral QHS   Continuous Infusions: . sodium chloride 75 mL/hr at 09/25/14 0207   PRN Meds:.acetaminophen **OR** [DISCONTINUED] acetaminophen, dextrose, [DISCONTINUED] ondansetron **OR** ondansetron (ZOFRAN) IV, oxyCODONE, senna-docusate  Antibiotics     Anti-infectives    Start     Dose/Rate Route Frequency Ordered Stop   09/21/14 1000  vancomycin (VANCOCIN) IVPB 750 mg/150 ml premix     750 mg 150 mL/hr over 60 Minutes Intravenous 2 times daily 09/21/14 0855 09/21/14 2329   09/16/14 1500  ceFEPIme (MAXIPIME) 1 g in dextrose 5 % 50 mL IVPB     1 g 100 mL/hr over 30 Minutes Intravenous 3 times per day 09/16/14 1300 09/21/14 2259   09/16/14 1400  metroNIDAZOLE (FLAGYL) IVPB 500 mg  Status:  Discontinued     500 mg 100 mL/hr over 60 Minutes Intravenous 3 times per day 09/16/14 1219 09/22/14 0735   09/16/14 1400  vancomycin (VANCOCIN) IVPB 1000 mg/200 mL premix  Status:  Discontinued     1,000 mg 200 mL/hr over 60 Minutes Intravenous 2 times daily 09/16/14 1300 09/21/14 0855   09/16/14 0000  vancomycin (VANCOCIN) 50 mg/mL oral solution 125 mg  Status:  Discontinued     125 mg Oral 4 times per day 09/15/14 2101 09/22/14 0735   09/15/14 2200  doxycycline (VIBRA-TABS) tablet 100 mg  Status:  Discontinued     100 mg Oral Every 12 hours 09/15/14 2101  09/16/14 1219        Objective:   Filed Vitals:   09/25/14 0539 09/25/14 0614 09/25/14 0935 09/25/14 1412  BP:  125/66 91/66 85/54   Pulse:  68 56 53  Temp: 99.1 F (37.3 C) 98.2 F (36.8 C) 98.6 F (37 C) 98.2 F (36.8 C)  TempSrc: Oral Oral Oral Oral  Resp:  Height:      Weight:      SpO2:  97% 100% 92%    Wt Readings from Last 3 Encounters:  09/22/14 75.751 kg (167 lb)  09/03/14 85.73 kg (189 lb)  07/24/14 74.844 kg (165 lb)     Intake/Output Summary (Last 24 hours) at 09/25/14 1754 Last data filed at 09/25/14 0900  Gross per 24 hour  Intake    360 ml  Output   1100 ml  Net   -740 ml     Physical Exam  Awake Alert, Oriented X 3, No new F.N deficits, Normal affect [place].AT,PERRAL Supple Neck,No JVD, No cervical lymphadenopathy appriciated.  Symmetrical Chest wall movement, Good air movement bilaterally,  CTAB RRR,No Gallops,Rubs or new Murmurs, No Parasternal Heave +ve B.Sounds, Abd Soft, No tenderness, No organomegaly appriciated, No rebound - guarding or rigidity. No Cyanosis, Clubbing or edema, No new Rash or bruise  Right foot under bandage post transmetatarsal amputation.   Data Review   Micro Results Recent Results (from the past 240 hour(s))  Surgical pcr screen     Status: None   Collection Time: 09/20/14  4:43 AM  Result Value Ref Range Status   MRSA, PCR NEGATIVE NEGATIVE Final   Staphylococcus aureus NEGATIVE NEGATIVE Final    Comment:        The Xpert SA Assay (FDA approved for NASAL specimens in patients over 221 years of age), is one component of a comprehensive surveillance program.  Test performance has been validated by [facility] for patients greater than or equal to 234 year old. It is not intended to diagnose infection nor to guide or monitor treatment.     Radiology Reports    Dg Foot 2 Views Right  09/16/2014   CLINICAL DATA:  Osteomyelitis.  EXAM: RIGHT FOOT - 2 VIEW  COMPARISON:  08/31/2014  FINDINGS:  Interval resection of the first and second toe. There is now cortical irregularity of the distal first metatarsal consistent with osteomyelitis. Soft tissue air noted in the surgical bed about the second digit. No additional site concerning for osteomyelitis.  IMPRESSION: Post resection of the first and second toes. 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   Electronically Signed   By: Rubye OaksMelanie  Ehinger M.D.   On: 09/16/2014 02:48        CBC  Recent Labs Lab 09/21/14 0759  WBC 10.4  HGB 9.5*  HCT 30.5*  PLT 169  MCV 99.3  MCH 30.9  MCHC 31.1  RDW 14.0    Chemistries   Recent Labs Lab 09/19/14 0525 09/21/14 0759 09/22/14 0522 09/24/14 1320 09/25/14 0650  NA 136 137  --  137 137  K 3.9 4.3  --  4.3 4.4  CL 101 100*  --  107 106  CO2 29 30  --  26 21*  GLUCOSE 180* 153*  --  234* 67  BUN 12 14  --  14 13  CREATININE 1.00 1.20 1.41* 1.32* 1.23  CALCIUM 8.6* 8.3*  --  8.4* 8.3*  MG 1.8 1.6* 2.0  --   --    ------------------------------------------------------------------------------------------------------------------ estimated creatinine clearance is 68.4 mL/min (by C-G formula based on Cr of 1.23). ------------------------------------------------------------------------------------------------------------------ No results for input(s): HGBA1C in the last 72 hours. ------------------------------------------------------------------------------------------------------------------ No results for input(s): CHOL, HDL, LDLCALC, TRIG, CHOLHDL, LDLDIRECT in the last 72 hours. ------------------------------------------------------------------------------------------------------------------ No results for input(s): TSH, T4TOTAL, T3FREE, THYROIDAB in the last 72 hours.  Invalid input(s): FREET3 ------------------------------------------------------------------------------------------------------------------ No results for input(s): VITAMINB12, FOLATE,  FERRITIN, TIBC, IRON, RETICCTPCT in the last 72 hours.  Coagulation profile No results for input(s): INR, PROTIME in the last 168 hours.  No results for input(s): DDIMER in the last 72 hours.  Cardiac Enzymes No results for input(s): CKMB, TROPONINI, MYOGLOBIN in the last 168 hours.  Invalid input(s): CK ------------------------------------------------------------------------------------------------------------------ Invalid input(s): POCBNP   Time Spent in minutes   35 minutes   Atanacio Melnyk A M.D on 09/25/2014 at 5:54 PM  Between 7am to 7pm - Pager - [phone]  After 7pm go to [email or web address] - password [email or web address]  [facility]   Office  [phone]

## 2014-09-25 NOTE — Progress Notes (Signed)
Physical Therapy Treatment Patient Details Name: Tyler LoganBilly Burgess MRN: [id number] DOB: 05/16/1955 Today's Date: 09/25/2014    History of Present Illness 59 yo male adm 09/15/14 with DKA, [facility], xrays reveal osteo and orthjo consult pending; PMHx:  depression, DM, psych d/o with HI adn SI.  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 Pt with incident of slurred speech and right UE weakness on 09/22/14, [facility], MRI neg for acute stroke.    PT Comments    Attempted amb with crutch per patient request however pt extremely unsafe and unable to maintain R LE TDWB. Pt aggreeable to use RW. Pt progressing well. Con't to require v/c's to maintain R LE TDWB. Acute PT to con't to follow to progress as able.   Follow Up Recommendations  SNF (or Behavioral health)     Equipment Recommendations  Rolling walker with 5" wheels    Recommendations for Other Services       Precautions / Restrictions Precautions Precautions: Fall Required Braces or Orthoses: Other Brace/Splint Other Brace/Splint: R post op shoe Restrictions Weight Bearing Restrictions: Yes RLE Weight Bearing: Touchdown weight bearing    Mobility  Bed Mobility Overal bed mobility: Independent                Transfers Overall transfer level: Needs assistance Equipment used: Rolling walker (2 wheeled) Transfers: Sit to/from Stand Sit to Stand: Min guard         General transfer comment: minA for transfering up to crutches due to instability, min guard for transferring up to RW  Ambulation/Gait Ambulation/Gait assistance: Min guard Ambulation Distance (Feet): 100 Feet Assistive device: Crutches;Rolling walker (2 wheeled) Gait Pattern/deviations: Step-to pattern Gait velocity: decr Gait velocity interpretation: <1.8 ft/sec, indicative of risk for recurrent falls General Gait Details: attempted axillary crutches however pt extremely unsteady and unable to maintain R LE TDWB status. pt  agreed that RW was better   [employer]            Wheelchair Mobility    Modified Rankin (Stroke Patients Only)       Balance Overall balance assessment:  (needs RW for safe amb)                                  Cognition Arousal/Alertness: Awake/alert Behavior During Therapy: WFL for tasks assessed/performed Overall Cognitive Status: Impaired/Different from baseline Area of Impairment:  (recent suicidal ideations)                    Exercises      General Comments        Pertinent Vitals/Pain Pain Assessment: 0-10 Pain Score: 5  Pain Location: R foot Pain Descriptors / Indicators: Burning Pain Intervention(s): Monitored during session    Home Living                      Prior Function            PT Goals (current goals can now be found in the care plan section) Progress towards PT goals: Progressing toward goals    Frequency  Min 3X/week    PT Plan Current plan remains appropriate;Frequency needs to be updated    Co-evaluation             End of Session Equipment Utilized During Treatment: Gait belt Activity Tolerance: Patient tolerated treatment well Patient left: with nursing/sitter in room (on commode  with CNA in room)     Time: 1610-9604 PT Time Calculation (min) (ACUTE ONLY): 14 min  Charges:  $Gait Training: 8-22 mins                    G Codes:      Marcene Brawn 09/25/2014, 9:37 AM   Lewis Shock, PT, DPT Pager #: [phone] Office #: [phone]

## 2014-09-25 NOTE — Clinical Social Work Note (Signed)
Clinical Social Work Assessment  Patient Details  Name: Tyler Burgess MRN: [id number] Date of Birth: Sep 06, 1955  Date of referral:  09/25/14               Reason for consult:  Facility Placement                Housing/Transportation Living arrangements for the past 2 months:  Homeless Source of Information:  Patient Patient Interpreter Needed:  None Criminal Activity/Legal Involvement Pertinent to Current Situation/Hospitalization:  No - Comment as needed Significant Relationships:  Siblings, Other Family Members Lives with:  Other (Comment) (Homeless ) Do you feel safe going back to the place where you live?  Yes Need for family participation in patient care:  No (Coment)  Care giving concerns:  N/A   Facilities manager / plan: CSW met the pt at the bedside. CSW introduced self and purpose of the visit. CSW discussed LOG SNF rehab. CSW explained the LOG SNF process. CSW explained LOG and its relation to SNF placement. CSW provided the pt with a LOG SNF list. CSW answered all questions in which the pt inquired about. CSW will continue to follow this pt and assist with discharge as needed.   Employment status:  Unemployed Forensic scientist:  Self Pay (Medicaid Pending) PT Recommendations:  [place] / Referral to community resources:  [place]  Patient/Family's Response to care:  The pt reported that the staff have been taking good care of him.   Patient/Family's Understanding of and Emotional Response to Diagnosis, Current Treatment, and Prognosis:  The pt acknowledged that he can had hard time with managing his health. The pt expressed interest receiving additional help if possible.   Emotional Assessment Appearance:  Appears stated age Attitude/Demeanor/Rapport:   (calm ) Affect (typically observed):  Appropriate Orientation:  Oriented to Self, Oriented to  Time, Oriented to Place, Oriented to Situation Psych involvement  (Current and /or in the community):  No (Comment)  Discharge Needs  Concerns to be addressed:  Discharge Planning Concerns Readmission within the last 30 days:  Yes Current discharge risk:  Homeless Barriers to Discharge:  No Barriers Identified   Trainer, MSW, [place]

## 2014-09-25 NOTE — Progress Notes (Addendum)
Occupational Therapy Treatment Patient Details Name: Tyler Burgess MRN: [id number] DOB: 1956/01/06 Today's Date: 09/25/2014    History of present illness 59 yo male adm 09/15/14 with DKA, recent great and second toe/TMA at [facility], xrays reveal osteo and orthjo consult pending; PMHx:  depression, DM, psych d/o with HI adn SI.  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 Pt with incident of slurred speech and right UE weakness on 09/22/14, transferred to [facility], MRI neg for acute stroke.   OT comments  Fabricated radial nerve palsy dynamic splint to increase pt's functional use of R hand. Pt verbalized understanding of wearing schedule. At this time, pt to wear splint 1 hr on, check for any redness to skin which does not disappear within 20 min, perform wrist strengthening exercises, and then either re-donn radial nerve palsy splint for another hour, or donn wrist cock up splint if he plans to fall asleep. The prefab wrist cock up splint is to be worn when sleeping or when using the walker. Will return this pm for a splint check. Will plan to see in am to recheck splint fit, teach HEP for RUE strengthening and teach donning/dofing of both splints.   Follow Up Recommendations  SNF;Supervision/Assistance - 24 hour    Equipment Recommendations  Other (comment)    Recommendations for Other Services      Precautions / Restrictions Precautions Precautions: Fall Required Braces or Orthoses: Other Brace/Splint Other Brace/Splint: R post op shoe (radial nerve palsy splint)              ADL  Radial nerve palsy splint fabricated. REviewed precautions with pt, including recommended starting out wearing splint for 1 hour on, removing and checking for any red areas, then either re-donn splint if he plans to use his hand, i.e. Eat, but if he plans to sleep or take a nap, to use his wrist cock up splint. Pt verbalized understanding.                                                                               Cognition   Behavior During Therapy: WFL for tasks assessed/performed Overall Cognitive Status: No family/caregiver present to determine baseline cognitive functioning                       Extremity/Trunk Assessment     see ADL section                            Pertinent Vitals/ Pain       Pain Assessment: 0-10 Pain Score: 3  Pain Location: R foot Pain Descriptors / Indicators: Aching Pain Intervention(s): Limited activity within patient's tolerance;Monitored during session  Home Living                                          Prior Functioning/Environment              Frequency Min 2X/week     Progress Toward Goals  OT Goals(current goals can now be found in the care plan section)  Progress  towards OT goals: Progressing toward goals  Acute Rehab OT Goals Patient Stated Goal: get RUE function back OT Goal Formulation: With patient Time For Goal Achievement: 10/06/14 Potential to Achieve Goals: Good ADL Goals Pt Will Perform Grooming: with min guard assist;standing Pt Will Perform Lower Body Bathing: with min guard assist;sit to/from stand Pt Will Perform Lower Body Dressing: with min guard assist;sit to/from stand Pt Will Transfer to Toilet: with min guard assist;ambulating;regular height toilet;grab bars Pt Will Perform Toileting - Clothing Manipulation and hygiene: with min guard assist;sit to/from stand Pt/caregiver will Perform Home Exercise Program: Increased ROM;Increased strength;Right Upper extremity;With written HEP provided Additional ADL Goal #1: Pt will be independent with splint wearing schedule. Additional ADL Goal #2: Pt will independently donn/doff radial nerve palsy splint and verbalize wearing schedule of both splints  Plan Discharge plan remains appropriate    Co-evaluation                 End of Session     Activity Tolerance Patient tolerated treatment well   Patient  Left in bed;with call bell/phone within reach   Nurse Communication Other (comment) (splint wearing schedule)        Time: [id number] OT Time Calculation (min): 72 min  Charges: OT General Charges $OT Visit: 1 Procedure OT Treatments  $Therapeutic Activity: 8-22 mins $Orthotics Fit/Training: 38-52 mins $ OT Supplies: 1 Supply (75.00)  Tonisha Silvey,HILLARY 09/25/2014, 5:54 PM   [facility] Tyanne Derocher, OTR/L  [phone] 09/25/2014

## 2014-09-25 NOTE — Clinical Social Work Placement (Signed)
   CLINICAL SOCIAL WORK PLACEMENT  NOTE  Date:  09/25/2014  Patient Details  Name: Ferrel LoganBilly Prioleau MRN: [id number] Date of Birth: 03/04/1956  Clinical Social Work is seeking post-discharge placement for this patient at the Skilled  Nursing Facility level of care (*CSW will initial, date and re-position this form in  chart as items are completed):  Yes   Patient/family provided with [place] Clinical Social Work Department's list of facilities offering this level of care within the geographic area requested by the patient (or if unable, by the patient's family).  Yes   Patient/family informed of their freedom to choose among providers that offer the needed level of care, that participate in Medicare, Medicaid or managed care program needed by the patient, have an available bed and are willing to accept the patient.  Yes   Patient/family informed of [facility] ownership interest in [facility] and [facility], as well as of the fact that they are under no obligation to receive care at these facilities.  PASRR submitted to EDS on       PASRR number received on       Existing PASRR number confirmed on       FL2 transmitted to all facilities in geographic area requested by pt/family on       FL2 transmitted to all facilities within larger geographic area on       Patient informed that his/her managed care company has contracts with or will negotiate with certain facilities, including the following:            Patient/family informed of bed offers received.  Patient chooses bed at       Physician recommends and patient chooses bed at      Patient to be transferred to   on  .  Patient to be transferred to facility by       Patient family notified on   of transfer.  Name of family member notified:        PHYSICIAN       Additional Comment:    _______________________________________________ Gwynne EdingerBibbs, Serinity Ware, LCSW 09/25/2014, 4:04 PM

## 2014-09-25 NOTE — Progress Notes (Signed)
Paged Md about 102.9 temp after receiving tylenol over one hour ago. BG was taken and was 63 and given OJ. Will recheck BG in 15 mins and will continue to monitor. Suzy Bouchardhompson, Martavius Lusty E, RN 09/25/2014 12:18 AM

## 2014-09-25 NOTE — Progress Notes (Signed)
Orthopedic Tech Progress Note Patient Details:  Tyler LoganBilly Dhingra 08/20/1955 [id number] OT fitting patient with night splint at this time. Velcro wrist splint left in room until OT finishes. Splint to be applied after fitting.  Ortho Devices Type of Ortho Device: Velcro wrist splint Ortho Device/Splint Location: RUE Ortho Device/Splint Interventions: Ordered   [place] R Thompson 09/25/2014, 2:26 PM

## 2014-09-25 NOTE — Progress Notes (Signed)
Administered 25 of dextrose will check BG in 15 mins. Melina Schoolshompson, Dravon Nott E, [place]RN 09/25/2014 12:39 AM

## 2014-09-25 NOTE — Progress Notes (Signed)
Patient ID: Tyler LoganBilly Burgess, male   DOB: 08/20/1955, 59 y.o.   MRN: [id number] Anticipate discharge to skilled nursing tomorrow. Orders written for touchdown weightbearing on the right with dressing changes daily. Wash with soap and water daily. Okay to shower. I will follow-up in the office in 3 weeks.

## 2014-09-26 DIAGNOSIS — E1310 Other specified diabetes mellitus with ketoacidosis without coma: Secondary | ICD-10-CM

## 2014-09-26 DIAGNOSIS — E1110 Type 2 diabetes mellitus with ketoacidosis without coma: Secondary | ICD-10-CM | POA: Insufficient documentation

## 2014-09-26 DIAGNOSIS — M869 Osteomyelitis, unspecified: Secondary | ICD-10-CM

## 2014-09-26 LAB — BASIC METABOLIC PANEL
Anion gap: 8 (ref 5–15)
BUN: 13 mg/dL (ref 6–20)
CALCIUM: 8.4 mg/dL — AB (ref 8.9–10.3)
CHLORIDE: 103 mmol/L (ref 101–111)
CO2: 25 mmol/L (ref 22–32)
Creatinine, Ser: 1.26 mg/dL — ABNORMAL HIGH (ref 0.61–1.24)
GFR calc Af Amer: >60 mL/min (ref >60)
GLUCOSE: 126 mg/dL — AB (ref 65–99)
POTASSIUM: 4.2 mmol/L (ref 3.5–5.1)
Sodium: 136 mmol/L (ref 135–145)

## 2014-09-26 LAB — GLUCOSE, CAPILLARY
GLUCOSE-CAPILLARY: 206 mg/dL — AB (ref 65–99)
Glucose-Capillary: 115 mg/dL — ABNORMAL HIGH (ref 65–99)

## 2014-09-26 MED ORDER — ARIPIPRAZOLE 5 MG PO TABS: 5.0000 mg | ORAL_TABLET | Freq: Two times a day (BID) | ORAL | Status: AC

## 2014-09-26 MED ORDER — INSULIN ASPART 100 UNIT/ML ~~LOC~~ SOLN: 5.0000 [IU] | Freq: Three times a day (TID) | SUBCUTANEOUS | Status: DC

## 2014-09-26 MED ORDER — ASPIRIN 81 MG PO CHEW: 81.0000 mg | CHEWABLE_TABLET | Freq: Every day | ORAL | Status: AC

## 2014-09-26 MED ORDER — BENZTROPINE MESYLATE 0.5 MG PO TABS: 0.5000 mg | ORAL_TABLET | Freq: Two times a day (BID) | ORAL | Status: DC

## 2014-09-26 MED ORDER — ACETAMINOPHEN 325 MG PO TABS: 650.0000 mg | ORAL_TABLET | Freq: Four times a day (QID) | ORAL | Status: DC | PRN

## 2014-09-26 MED ORDER — GABAPENTIN 300 MG PO CAPS: 300.0000 mg | ORAL_CAPSULE | Freq: Three times a day (TID) | ORAL | Status: AC

## 2014-09-26 MED ORDER — SENNOSIDES-DOCUSATE SODIUM 8.6-50 MG PO TABS: 1.0000 | ORAL_TABLET | Freq: Every evening | ORAL | Status: DC | PRN

## 2014-09-26 MED ORDER — SACCHAROMYCES BOULARDII 250 MG PO CAPS: 250.0000 mg | ORAL_CAPSULE | Freq: Two times a day (BID) | ORAL | Status: DC

## 2014-09-26 MED ORDER — INSULIN GLARGINE 100 UNIT/ML ~~LOC~~ SOLN: 20.0000 [IU] | Freq: Every day | SUBCUTANEOUS | Status: DC

## 2014-09-26 MED ORDER — INSULIN ASPART 100 UNIT/ML ~~LOC~~ SOLN: 0.0000 [IU] | Freq: Three times a day (TID) | SUBCUTANEOUS | Status: DC

## 2014-09-26 MED ORDER — SERTRALINE HCL 100 MG PO TABS: 100.0000 mg | ORAL_TABLET | Freq: Every day | ORAL | Status: DC

## 2014-09-26 NOTE — Progress Notes (Signed)
Occupational Therapy Treatment Patient Details Name: Tyler Burgess MRN: [id number] DOB: [birth year] Today's Date: 09/26/2014    History of present illness 59 yo male adm 09/15/14 with DKA, recent great and second toe/TMA at [facility], xrays reveal osteo and orthjo consult pending; PMHx:  depression, DM, psych d/o with HI adn SI.  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 Pt with incident of slurred speech and right UE weakness on 09/22/14, transferred to [facility], MRI neg for acute stroke.   OT comments  Pt progressing towards acute OT goals. Pt with limited recall of splint protocol. Able to complete RUE exercises with minimal cues for technique. Pt able to donn/doff splints independently. Reviewed splint wearing schedule using teach back method and provided written instructions. Pt donned dynamic radial nerve palsy splint. ADLs and education completed as detailed below. D/c plan remains appropriate.  Follow Up Recommendations  SNF;Supervision/Assistance - 24 hour    Equipment Recommendations       Recommendations for Other Services      Precautions / Restrictions Precautions Precautions: Fall Required Braces or Orthoses: Other Brace/Splint (radial nerve palsy and wrist cock up splint) Other Brace/Splint: R post op shoe Restrictions Weight Bearing Restrictions: Yes RLE Weight Bearing: Touchdown weight bearing Other Position/Activity Restrictions: TWB ordered post TMA       Mobility Bed Mobility               General bed mobility comments: Pt sitting EOB at start adn end of session.  Transfers Overall transfer level: Needs assistance Equipment used: Rolling walker (2 wheeled) Transfers: Sit to/from Stand Sit to Stand: Min guard         General transfer comment: for safety. from EOB. maintained TWB RLE    Balance Overall balance assessment: Needs assistance Sitting-balance support: No upper extremity supported;Feet supported Sitting balance-Leahy Scale: Good     Standing balance  support: Bilateral upper extremity supported;During functional activity Standing balance-Leahy Scale: Poor Standing balance comment: needs rw to assit with WB status of RLE                   ADL                                         General ADL Comments: Pt with no splint on upon therapist arrival. Pt with lmiited recall of instructions but able to don/doff spint and perform RUE exercises with minimal cues for technique. Reviewed splint wearing schedule and provided written instructions. Pt indicated and demonstrated difficulty maintaining grasp of rw when in wrist cock-up splint. Pt completed functional mobility in room wearing no splint and able to maintain grasp of rw. Instructed pt to sleep in wrist cock-up splint, not dynaic radial nerve palsy splint.       Vision                     Perception     Praxis      Cognition   Behavior During Therapy: [facility] for tasks assessed/performed Overall Cognitive Status: No family/caregiver present to determine baseline cognitive functioning                       Extremity/Trunk Assessment               Exercises     Shoulder Instructions       General Comments  Pertinent Vitals/ Pain       Pain Assessment: Faces Faces Pain Scale: Hurts little more Pain Location: Rt foot Pain Descriptors / Indicators: Aching Pain Intervention(s): Limited activity within patient's tolerance;Monitored during session  Home Living                                          Prior Functioning/Environment              Frequency Min 2X/week     Progress Toward Goals  OT Goals(current goals can now be found in the care plan section)  Progress towards OT goals: Progressing toward goals  Acute Rehab OT Goals Patient Stated Goal: get RUE function back OT Goal Formulation: With patient Time For Goal Achievement: 10/06/14 Potential to Achieve Goals: Good ADL Goals Pt Will  Perform Grooming: with min guard assist;standing Pt Will Perform Lower Body Bathing: with min guard assist;sit to/from stand Pt Will Perform Lower Body Dressing: with min guard assist;sit to/from stand Pt Will Transfer to Toilet: with min guard assist;ambulating;regular height toilet;grab bars Pt Will Perform Toileting - Clothing Manipulation and hygiene: with min guard assist;sit to/from stand Pt/caregiver will Perform Home Exercise Program: Increased ROM;Increased strength;Right Upper extremity;With written HEP provided Additional ADL Goal #1: Pt will be independent with splint wearing schedule. Additional ADL Goal #2: Pt will independently donn/doff radial nerve palsy splint and verbalize wearing schedule of both splints  Plan Discharge plan remains appropriate    Co-evaluation                 End of Session Equipment Utilized During Treatment: Gait belt;Rolling walker;Other (comment) (dynamic and static splints)   Activity Tolerance Patient tolerated treatment well   Patient Left in bed;with call bell/phone within reach   Nurse Communication          Time: [id number] OT Time Calculation (min): 22 min  Charges: OT General Charges $OT Visit: 1 Procedure OT Treatments $Self Care/Home Management : 8-22 mins  Pilar GrammesMathews, Selicia Windom H 09/26/2014, 10:55 AM

## 2014-09-26 NOTE — Clinical Social Work Placement (Signed)
   CLINICAL SOCIAL WORK PLACEMENT  NOTE  Date:  09/26/2014  Patient Details  Name: Tyler Burgess MRN: [id number] Date of Birth: 08/02/1955  Clinical Social Work is seeking post-discharge placement for this patient at the Skilled  Nursing Facility level of care (*CSW will initial, date and re-position this form in  chart as items are completed):  Yes   Patient/family provided with [facility] Clinical Social Work Department's list of facilities offering this level of care within the geographic area requested by the patient (or if unable, by the patient's family).  Yes   Patient/family informed of their freedom to choose among providers that offer the needed level of care, that participate in Medicare, Medicaid or managed care program needed by the patient, have an available bed and are willing to accept the patient.  Yes   Patient/family informed of [facility]'s ownership interest in [facility] and [facility], as well as of the fact that they are under no obligation to receive care at these facilities.  PASRR submitted to EDS on       PASRR number received on       Existing PASRR number confirmed on 09/26/14     FL2 transmitted to all facilities in geographic area requested by pt/family on       FL2 transmitted to all facilities within larger geographic area on 09/26/14     Patient informed that his/her managed care company has contracts with or will negotiate with certain facilities, including the following:        Yes   Patient/family informed of bed offers received.  [facility] [place]     Physician recommends and patient chooses bed at      [facility] [place] [facility] 09/26/14.  Patient to be transferred to facility by [facility]      Patient family notified on 09/26/14 of transfer.  Name of family member notified:  Pt will notify family      PHYSICIAN       Additional Comment:     _______________________________________________ Gwynne EdingerBibbs, Areya Lemmerman, LCSW 09/26/2014, 1:28 PM

## 2014-09-26 NOTE — Progress Notes (Signed)
Pt discharging at this time alert, verbal taking all personal belongings. Pt transported via stretcher [facility] transport. No noted distress. IV discontinued, dry dressing applied. Discharge paperwork sent to SNF.  Noted some dry blood to dressing site to right toes attempted to change but pt refused stating, "Cant they do that over there?" He denies pain or discomfort at this time. Report called in to nurse, Herbert [facility]ather [facility] in [place].

## 2014-09-26 NOTE — Discharge Instructions (Signed)
Diabetes Mellitus and Food It is important for you to manage your blood sugar (glucose) level. Your blood glucose level can be greatly affected by what you eat. Eating healthier foods in the appropriate amounts throughout the day at about the same time each day will help you control your blood glucose level. It can also help slow or prevent worsening of your diabetes mellitus. Healthy eating may even help you improve the level of your blood pressure and reach or maintain a healthy weight.  HOW CAN FOOD AFFECT ME? Carbohydrates Carbohydrates affect your blood glucose level more than any other type of food. Your dietitian will help you determine how many carbohydrates to eat at each meal and teach you how to count carbohydrates. Counting carbohydrates is important to keep your blood glucose at a healthy level, especially if you are using insulin or taking certain medicines for diabetes mellitus. Alcohol Alcohol can cause sudden decreases in blood glucose (hypoglycemia), especially if you use insulin or take certain medicines for diabetes mellitus. Hypoglycemia can be a life-threatening condition. Symptoms of hypoglycemia (sleepiness, dizziness, and disorientation) are similar to symptoms of having too much alcohol.  If your health care provider has given you approval to drink alcohol, do so in moderation and use the following guidelines:  Women should not have more than one drink per day, and men should not have more than two drinks per day. One drink is equal to:  12 oz of beer.  5 oz of wine.  1 oz of hard liquor.  Do not drink on an empty stomach.  Keep yourself hydrated. Have water, diet soda, or unsweetened iced tea.  Regular soda, juice, and other mixers might contain a lot of carbohydrates and should be counted. WHAT FOODS ARE NOT RECOMMENDED? As you make food choices, it is important to remember that all foods are not the same. Some foods have fewer nutrients per serving than other  foods, even though they might have the same number of calories or carbohydrates. It is difficult to get your body what it needs when you eat foods with fewer nutrients. Examples of foods that you should avoid that are high in calories and carbohydrates but low in nutrients include:  Trans fats (most processed foods list trans fats on the Nutrition Facts label).  Regular soda.  Juice.  Candy.  Sweets, such as cake, pie, doughnuts, and cookies.  Fried foods. WHAT FOODS CAN I EAT? Have nutrient-rich foods, which will nourish your body and keep you healthy. The food you should eat also will depend on several factors, including:  The calories you need.  The medicines you take.  Your weight.  Your blood glucose level.  Your blood pressure level.  Your cholesterol level. You also should eat a variety of foods, including:  Protein, such as meat, poultry, fish, tofu, nuts, and seeds (lean animal proteins are best).  Fruits.  Vegetables.  Dairy products, such as milk, cheese, and yogurt (low fat is best).  Breads, grains, pasta, cereal, rice, and beans.  Fats such as olive oil, trans fat-free margarine, canola oil, avocado, and olives. DOES EVERYONE WITH DIABETES MELLITUS HAVE THE SAME MEAL PLAN? Because every person with diabetes mellitus is different, there is not one meal plan that works for everyone. It is very important that you meet with a dietitian who will help you create a meal plan that is just right for you. Document Released: 01/08/2005 Document Revised: 04/18/2013 Document Reviewed: 03/10/2013 ExitCare Patient Information 2015 [employer]. This   information is not intended to replace advice given to you by your health care provider. Make sure you discuss any questions you have with your health care provider. Diabetes and Standards of Medical Care Diabetes is complicated. You may find that your diabetes team includes a dietitian, nurse, diabetes educator, eye doctor,  and more. To help everyone know what is going on and to help you get the care you deserve, the following schedule of care was developed to help keep you on track. Below are the tests, exams, vaccines, medicines, education, and plans you will need. HbA1c test This test shows how well you have controlled your glucose over the past 2-3 months. It is used to see if your diabetes management plan needs to be adjusted.   It is performed at least 2 times a year if you are meeting treatment goals.  It is performed 4 times a year if therapy has changed or if you are not meeting treatment goals. Blood pressure test  This test is performed at every routine medical visit. The goal is less than 140/90 mm Hg for most people, but 130/80 mm Hg in some cases. Ask your health care provider about your goal. Dental exam  Follow up with the dentist regularly. Eye exam  If you are diagnosed with type 1 diabetes as a child, get an exam upon reaching the age of 10 years or older and have had diabetes for 3-5 years. Yearly eye exams are recommended after that initial eye exam.  If you are diagnosed with type 1 diabetes as an adult, get an exam within 5 years of diagnosis and then yearly.  If you are diagnosed with type 2 diabetes, get an exam as soon as possible after the diagnosis and then yearly. Foot care exam  Visual foot exams are performed at every routine medical visit. The exams check for cuts, injuries, or other problems with the feet.  A comprehensive foot exam should be done yearly. This includes visual inspection as well as assessing foot pulses and testing for loss of sensation.  Check your feet nightly for cuts, injuries, or other problems with your feet. Tell your health care provider if anything is not healing. Kidney function test (urine microalbumin)  This test is performed once a year.  Type 1 diabetes: The first test is performed 5 years after diagnosis.  Type 2 diabetes: The first test is  performed at the time of diagnosis.  A serum creatinine and estimated glomerular filtration rate (eGFR) test is done once a year to assess the level of chronic kidney disease (CKD), if present. Lipid profile (cholesterol, HDL, LDL, triglycerides)  Performed every 5 years for most people.  The goal for LDL is less than 100 mg/dL. If you are at high risk, the goal is less than 70 mg/dL.  The goal for HDL is 40 mg/dL-50 mg/dL for men and 50 mg/dL-60 mg/dL for women. An HDL cholesterol of 60 mg/dL or higher gives some protection against heart disease.  The goal for triglycerides is less than 150 mg/dL. Influenza vaccine, pneumococcal vaccine, and hepatitis B vaccine  The influenza vaccine is recommended yearly.  It is recommended that people with diabetes who are over 65 years old get the pneumonia vaccine. In some cases, two separate shots may be given. Ask your health care provider if your pneumonia vaccination is up to date.  The hepatitis B vaccine is also recommended for adults with diabetes. Diabetes self-management education  Education is recommended at diagnosis and   ongoing as needed. Treatment plan  Your treatment plan is reviewed at every medical visit. Document Released: 02/08/2009 Document Revised: 08/28/2013 Document Reviewed: 09/13/2012 ExitCare Patient Information 2015 [employer]. This information is not intended to replace advice given to you by your health care provider. Make sure you discuss any questions you have with your health care provider.  
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
## 2014-09-30 ENCOUNTER — Encounter (HOSPITAL_COMMUNITY): Payer: Self-pay | Admitting: *Deleted

## 2014-09-30 ENCOUNTER — Ambulatory Visit (HOSPITAL_COMMUNITY)
Admission: RE | Admit: 2014-09-30 | Discharge: 2014-09-30 | Disposition: A | Discharge status: Home / Self Care | Payer: Medicaid Other | Attending: Psychiatry | Admitting: Psychiatry

## 2014-09-30 ENCOUNTER — Emergency Department (HOSPITAL_COMMUNITY)
Admission: EM | Admit: 2014-09-30 | Discharge: 2014-10-02 | Disposition: A | Discharge status: Home / Self Care | Payer: Medicaid Other | Attending: Emergency Medicine | Admitting: Emergency Medicine

## 2014-09-30 DIAGNOSIS — E119 Type 2 diabetes mellitus without complications: Secondary | ICD-10-CM | POA: Diagnosis not present

## 2014-09-30 DIAGNOSIS — Z720 Tobacco use: Secondary | ICD-10-CM | POA: Insufficient documentation

## 2014-09-30 DIAGNOSIS — R45851 Suicidal ideations: Secondary | ICD-10-CM | POA: Diagnosis not present

## 2014-09-30 DIAGNOSIS — Z79899 Other long term (current) drug therapy: Secondary | ICD-10-CM | POA: Insufficient documentation

## 2014-09-30 DIAGNOSIS — Z7982 Long term (current) use of aspirin: Secondary | ICD-10-CM | POA: Diagnosis not present

## 2014-09-30 DIAGNOSIS — Z794 Long term (current) use of insulin: Secondary | ICD-10-CM | POA: Diagnosis not present

## 2014-09-30 DIAGNOSIS — F329 Major depressive disorder, single episode, unspecified: Secondary | ICD-10-CM | POA: Insufficient documentation

## 2014-09-30 DIAGNOSIS — F332 Major depressive disorder, recurrent severe without psychotic features: Secondary | ICD-10-CM | POA: Diagnosis present

## 2014-09-30 DIAGNOSIS — Z590 Homelessness: Secondary | ICD-10-CM | POA: Insufficient documentation

## 2014-09-30 LAB — CBC WITH DIFFERENTIAL/PLATELET
Basophils Absolute: 0.1 10*3/uL (ref 0.0–0.1)
Basophils Relative: 1 % (ref 0–1)
Eosinophils Absolute: 0.1 10*3/uL (ref 0.0–0.7)
Eosinophils Relative: 1 % (ref 0–5)
HEMATOCRIT: 31.9 % — AB (ref 39.0–52.0)
Hemoglobin: 10.9 g/dL — ABNORMAL LOW (ref 13.0–17.0)
Lymphocytes Relative: 30 % (ref 12–46)
Lymphs Abs: 3.2 10*3/uL (ref 0.7–4.0)
MCH: 32 pg (ref 26.0–34.0)
MCHC: 34.2 g/dL (ref 30.0–36.0)
MCV: 93.5 fL (ref 78.0–100.0)
Monocytes Absolute: 0.9 10*3/uL (ref 0.1–1.0)
Monocytes Relative: 8 % (ref 3–12)
NEUTROS ABS: 6.4 10*3/uL (ref 1.7–7.7)
NEUTROS PCT: 60 % (ref 43–77)
Platelets: 438 10*3/uL — ABNORMAL HIGH (ref 150–400)
RBC: 3.41 MIL/uL — AB (ref 4.22–5.81)
RDW: 13.3 % (ref 11.5–15.5)
WBC: 10.7 10*3/uL — AB (ref 4.0–10.5)

## 2014-09-30 LAB — BASIC METABOLIC PANEL
Anion gap: 8 (ref 5–15)
BUN: 18 mg/dL (ref 6–20)
CALCIUM: 8.6 mg/dL — AB (ref 8.9–10.3)
CO2: 29 mmol/L (ref 22–32)
CREATININE: 1.22 mg/dL (ref 0.61–1.24)
Chloride: 99 mmol/L — ABNORMAL LOW (ref 101–111)
GFR calc Af Amer: >60 mL/min (ref >60)
GFR calc non Af Amer: >60 mL/min (ref >60)
GLUCOSE: 377 mg/dL — AB (ref 65–99)
POTASSIUM: 4.4 mmol/L (ref 3.5–5.1)
Sodium: 136 mmol/L (ref 135–145)

## 2014-09-30 LAB — CBG MONITORING, ED
Glucose-Capillary: 283 mg/dL — ABNORMAL HIGH (ref 65–99)
Glucose-Capillary: 219 mg/dL — ABNORMAL HIGH (ref 65–99)

## 2014-09-30 LAB — RAPID URINE DRUG SCREEN, HOSP PERFORMED
AMPHETAMINES: NOT DETECTED
BARBITURATES: NOT DETECTED
Benzodiazepines: NOT DETECTED
Cocaine: NOT DETECTED
Opiates: NOT DETECTED
Tetrahydrocannabinol: NOT DETECTED

## 2014-09-30 LAB — ETHANOL: Alcohol, Ethyl (B): <5 mg/dL (ref <5)

## 2014-09-30 MED ORDER — GABAPENTIN 300 MG PO CAPS
300.0000 mg | ORAL_CAPSULE | Freq: Three times a day (TID) | ORAL | Status: DC
  Administered 2014-09-30 – 2014-10-02 (×6): 300 mg via ORAL
  Filled 2014-09-30 (×6): qty 1

## 2014-09-30 MED ORDER — SERTRALINE HCL 50 MG PO TABS
100.0000 mg | ORAL_TABLET | Freq: Every day | ORAL | Status: DC
  Administered 2014-09-30 – 2014-10-02 (×3): 100 mg via ORAL
  Filled 2014-09-30 (×4): qty 2

## 2014-09-30 MED ORDER — INSULIN ASPART 100 UNIT/ML ~~LOC~~ SOLN
0.0000 [IU] | Freq: Three times a day (TID) | SUBCUTANEOUS | Status: DC
  Administered 2014-09-30: 8 [IU] via SUBCUTANEOUS

## 2014-09-30 MED ORDER — ADULT MULTIVITAMIN W/MINERALS CH
1.0000 | ORAL_TABLET | Freq: Every day | ORAL | Status: DC
Start: 2014-09-30 — End: 2014-10-02
  Administered 2014-09-30 – 2014-10-02 (×3): 1 via ORAL
  Filled 2014-09-30 (×3): qty 1

## 2014-09-30 MED ORDER — INSULIN ASPART 100 UNIT/ML ~~LOC~~ SOLN
5.0000 [IU] | Freq: Three times a day (TID) | SUBCUTANEOUS | Status: DC
  Administered 2014-09-30: 5 [IU] via SUBCUTANEOUS

## 2014-09-30 MED ORDER — BENZTROPINE MESYLATE 1 MG PO TABS
0.5000 mg | ORAL_TABLET | Freq: Two times a day (BID) | ORAL | Status: DC
  Administered 2014-09-30 – 2014-10-02 (×5): 0.5 mg via ORAL
  Filled 2014-09-30 (×5): qty 1

## 2014-09-30 MED ORDER — ASPIRIN 81 MG PO CHEW
81.0000 mg | CHEWABLE_TABLET | Freq: Every day | ORAL | Status: DC
  Administered 2014-09-30 – 2014-10-02 (×3): 81 mg via ORAL
  Filled 2014-09-30 (×3): qty 1

## 2014-09-30 MED ORDER — INSULIN GLARGINE 100 UNIT/ML ~~LOC~~ SOLN
20.0000 [IU] | Freq: Every day | SUBCUTANEOUS | Status: DC
Start: 2014-09-30 — End: 2014-10-01
  Administered 2014-09-30: 20 [IU] via SUBCUTANEOUS
  Filled 2014-09-30: qty 0.2

## 2014-09-30 MED ORDER — ARIPIPRAZOLE 5 MG PO TABS
5.0000 mg | ORAL_TABLET | Freq: Two times a day (BID) | ORAL | Status: DC
  Administered 2014-09-30 – 2014-10-02 (×5): 5 mg via ORAL
  Filled 2014-09-30 (×6): qty 1

## 2014-09-30 MED ORDER — ACETAMINOPHEN 325 MG PO TABS: 650.0000 mg | ORAL_TABLET | Freq: Four times a day (QID) | ORAL | Status: DC | PRN

## 2014-09-30 MED ORDER — SENNOSIDES-DOCUSATE SODIUM 8.6-50 MG PO TABS: 1.0000 | ORAL_TABLET | Freq: Every evening | ORAL | Status: DC | PRN

## 2014-09-30 MED ORDER — SACCHAROMYCES BOULARDII 250 MG PO CAPS
250.0000 mg | ORAL_CAPSULE | Freq: Two times a day (BID) | ORAL | Status: DC
  Administered 2014-09-30 – 2014-10-02 (×5): 250 mg via ORAL
  Filled 2014-09-30 (×6): qty 1

## 2014-09-30 NOTE — ED Provider Notes (Signed)
CSN: [id number]     Arrival date & time 09/30/14  1238 History   First MD Initiated Contact with Patient 09/30/14 1356     Chief Complaint  Patient presents with  . Suicidal      HPI Pt was seen at 1355. Per pt, c/o gradual onset and worsening of persistent depression and SI for the past several days. Pt states he plans to "hang myself." Pt also states he is homeless and unable to afford his meds. Denies SA, no HI, no hallucinations.    Past Medical History  Diagnosis Date  . Diabetes mellitus without complication   . Depression    Past Surgical History  Procedure Laterality Date  . Right arm    . Cholecystectomy    . Amputation toe Right 09/03/2014    Procedure: AMPUTATION RIGHT GREAT TOE AND SECOND TOE;  Surgeon: Franky Macho Md, MD;  Location: [facility];  Service: General;  Laterality: Right;  right great and second toes  . Amputation Right 09/20/2014    Procedure: RIGHT TRANSMETATARSAL AMPUTATION;  Surgeon: Nadara Mustard, MD;  Location: [facility];  Service: Orthopedics;  Laterality: Right;   Family History  Problem Relation Age of Onset  . Diabetes Mother   . Depression Father   . Depression Brother   . Diabetes Other   . Depression Other    History  Substance Use Topics  . Smoking status: Current Every Day Smoker -- 0.50 packs/day for 35 years    Types: Cigarettes  . Smokeless tobacco: Not on file  . Alcohol Use: 1.2 oz/week    2 Cans of beer per week     Comment: once per month    Review of Systems ROS: Statement: All systems negative except as marked or noted in the HPI; Constitutional: Negative for fever and chills. ; ; Eyes: Negative for eye pain, redness and discharge. ; ; ENMT: Negative for ear pain, hoarseness, nasal congestion, sinus pressure and sore throat. ; ; Cardiovascular: Negative for chest pain, palpitations, diaphoresis, dyspnea and peripheral edema. ; ; Respiratory: Negative for cough, wheezing and stridor. ; ; Gastrointestinal: Negative for nausea,  vomiting, diarrhea, abdominal pain, blood in stool, hematemesis, jaundice and rectal bleeding. . ; ; Genitourinary: Negative for dysuria, flank pain and hematuria. ; ; Musculoskeletal: Negative for back pain and neck pain. Negative for swelling and trauma.; ; Skin: Negative for pruritus, rash, abrasions, blisters, bruising and skin lesion.; ; Neuro: Negative for headache, lightheadedness and neck stiffness. Negative for weakness, altered level of consciousness , altered mental status, extremity weakness, paresthesias, involuntary movement, seizure and syncope.; Psych:  +SI, no SA, no HI, no hallucinations.       Allergies  Review of patient's allergies indicates no known allergies.  Home Medications   Prior to Admission medications   Medication Sig Start Date End Date Taking? Authorizing Provider  acetaminophen (TYLENOL) 325 MG tablet Take 2 tablets (650 mg total) by mouth every 6 (six) hours as needed for mild pain (or Fever >/= 101). 09/26/14   Alison Murray, MD  ARIPiprazole (ABILIFY) 5 MG tablet Take 1 tablet (5 mg total) by mouth 2 (two) times daily. 09/26/14   Alison Murray, MD  aspirin 81 MG chewable tablet Chew 1 tablet (81 mg total) by mouth daily. 09/26/14   Alison Murray, MD  benztropine (COGENTIN) 0.5 MG tablet Take 1 tablet (0.5 mg total) by mouth 2 (two) times daily. 09/26/14   Alison Murray, MD  gabapentin (NEURONTIN) 300  MG capsule Take 1 capsule (300 mg total) by mouth 3 (three) times daily. 09/26/14   Alison MurrayAlma M Devine, MD  insulin aspart (NOVOLOG) 100 UNIT/ML injection Inject 5 Units into the skin 3 (three) times daily with meals. 09/26/14   Alison MurrayAlma M Devine, MD  insulin aspart (NOVOLOG) 100 UNIT/ML injection Inject 0-15 Units into the skin 3 (three) times daily with meals. 09/26/14   Alison MurrayAlma M Devine, MD  insulin glargine (LANTUS) 100 UNIT/ML injection Inject 0.2 mLs (20 Units total) into the skin at bedtime. 09/26/14   Alison MurrayAlma M Devine, MD  Multiple Vitamin (MULTIVITAMIN WITH MINERALS) TABS tablet Take 1  tablet by mouth daily. Patient not taking: Reported on 09/15/2014 04/18/14   Thermon LeylandLaura A Davis, NP  saccharomyces boulardii (FLORASTOR) 250 MG capsule Take 1 capsule (250 mg total) by mouth 2 (two) times daily. 09/26/14   Alison MurrayAlma M Devine, MD  senna-docusate (SENOKOT-S) 8.6-50 MG per tablet Take 1 tablet by mouth at bedtime as needed for mild constipation. 09/26/14   Alison MurrayAlma M Devine, MD  sertraline (ZOLOFT) 100 MG tablet Take 1 tablet (100 mg total) by mouth daily. 09/26/14   Alison MurrayAlma M Devine, MD   BP 176/93 mmHg  Pulse 54  Temp(Src) 98.5 F (36.9 C) (Oral)  Resp 18  SpO2 100% Physical Exam  1400: Physical examination:  Nursing notes reviewed; Vital signs and O2 SAT reviewed;  Constitutional: Well developed, Well nourished, Well hydrated, In no acute distress; Head:  Normocephalic, atraumatic; Eyes: EOMI, PERRL, No scleral icterus; ENMT: Mouth and pharynx normal, Mucous membranes moist; Neck: Supple, Full range of motion, No lymphadenopathy; Cardiovascular: Regular rate and rhythm, No gallop; Respiratory: Breath sounds clear & equal bilaterally, No wheezes.  Speaking full sentences with ease, Normal respiratory effort/excursion; Chest: Nontender, Movement normal; Abdomen: Soft, Nontender, Nondistended, Normal bowel sounds; Genitourinary: No CVA tenderness; Extremities: Pulses normal, No deformity. DSD right foot. No edema, No calf edema or asymmetry.; Neuro: AA&Ox3, Major CN grossly intact.  Speech clear. No gross focal motor or sensory deficits in extremities.; Skin: Color normal, Warm, Dry.; Psych:  Affect flat, poor eye contact. Endorses SI.    ED Course  Procedures   1410:  TTS has evaluated pt PTA to the ED: recommend inpt admission. Social Work has also been contacted regarding pt's homelessness: they will follow.  Holding orders written.    MDM  MDM Reviewed: previous chart, nursing note and vitals Reviewed previous: labs Interpretation: labs      Results for orders placed or performed during  the hospital encounter of 09/30/14  Basic metabolic panel  Result Value Ref Range   Sodium 136 135 - 145 mmol/L   Potassium 4.4 3.5 - 5.1 mmol/L   Chloride 99 (L) 101 - 111 mmol/L   CO2 29 22 - 32 mmol/L   Glucose, Bld 377 (H) 65 - 99 mg/dL   BUN 18 6 - 20 mg/dL   Creatinine, Ser 1.611.22 0.61 - 1.24 mg/dL   Calcium 8.6 (L) 8.9 - 10.3 mg/dL   GFR calc non Af Amer >60 >60 mL/min   GFR calc Af Amer >60 >60 mL/min   Anion gap 8 5 - 15  CBC with Differential  Result Value Ref Range   WBC 10.7 (H) 4.0 - 10.5 K/uL   RBC 3.41 (L) 4.22 - 5.81 MIL/uL   Hemoglobin 10.9 (L) 13.0 - 17.0 g/dL   HCT 09.631.9 (L) 04.539.0 - 40.952.0 %   MCV 93.5 78.0 - 100.0 fL   MCH 32.0 26.0 -  34.0 pg   MCHC 34.2 30.0 - 36.0 g/dL   RDW 16.1 09.6 - 04.5 %   Platelets 438 (H) 150 - 400 K/uL   Neutrophils Relative % 60 43 - 77 %   Lymphocytes Relative 30 12 - 46 %   Monocytes Relative 8 3 - 12 %   Eosinophils Relative 1 0 - 5 %   Basophils Relative 1 0 - 1 %   Neutro Abs 6.4 1.7 - 7.7 K/uL   Lymphs Abs 3.2 0.7 - 4.0 K/uL   Monocytes Absolute 0.9 0.1 - 1.0 K/uL   Eosinophils Absolute 0.1 0.0 - 0.7 K/uL   Basophils Absolute 0.1 0.0 - 0.1 K/uL   Smear Review MORPHOLOGY UNREMARKABLE   Ethanol  Result Value Ref Range   Alcohol, Ethyl (B) <5 <5 mg/dL  Urine rapid drug screen (hosp performed)  Result Value Ref Range   Opiates NONE DETECTED NONE DETECTED   Cocaine NONE DETECTED NONE DETECTED   Benzodiazepines NONE DETECTED NONE DETECTED   Amphetamines NONE DETECTED NONE DETECTED   Tetrahydrocannabinol NONE DETECTED NONE DETECTED   Barbiturates NONE DETECTED NONE DETECTED       Samuel Jester, DO 09/30/14 1541

## 2014-09-30 NOTE — [facility] Counselor (Signed)
Per Joann Glover, AC at [facility], adult unit is currently at capacity. Contacted the following facilities for placement:  BED AVAILABLE, FAXED CLINICAL INFORMATION: [facility], [facility], per Laci  AT CAPACITY: [facility], per Renita [facility], per Jennifer [facility], per Teresa [facility], per [facility], per [facility], per Kathy [facility], per [facility], per Gina [facility], per [facility], per Jean [facility], per [facility], per Bernad[facility], per Sheila [facility], per Denise [facility], per Res[facility], per Susan [facility], per Barbara [facility], per Jonah   Cray Monnin Ellis Dray Dente Jr, LPC, NCC, [facility] Triage Specialist [phone] 
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
## 2014-09-30 NOTE — BH Assessment (Addendum)
Patient a [facility] at [facility]. Assessment completed. Clinician consulted with Renata Capriceonrad, NP who states Pt meets criteria for inpatient admission. Minerva AreolaEric, [facility] reported no bed availability at this time. TTS will seek placement at alternative facilties.  Patty, Press photographerCharge Nurse at [facility] informed of patient's need to [facility]. [facility] called for transport.  Nira Retortelilah Lahoma Constantin, MSW, LCSW Triage Specialist [phone],

## 2014-09-30 NOTE — BH Assessment (Signed)
Clinician [facility] [phone] and spoke to nurse Jeronimo NormaJeanie who reports patient left AMA on 09/29/14 from facility after becoming agitated that he could not get his pain medication when he wanted to. Staff made attempts to get patient to stay but patient refused. Patient unable to return to this facility.   Nira Retortelilah Everlena Mackley, MSW, LCSW Triage Specialist [phone],

## 2014-09-30 NOTE — ED Notes (Signed)
Pt sts he is suicidal and has a plan to hang himself.

## 2014-09-30 NOTE — Progress Notes (Signed)
[facility]. TTS consult not needed.   Tyler ColonelGregory Pickett Jr. MSW, LCSW Therapeutic Triage Services-Triage Specialist   Phone: [phone] Fax: [phone]

## 2014-10-01 ENCOUNTER — Emergency Department (HOSPITAL_COMMUNITY): Payer: Medicaid Other

## 2014-10-01 DIAGNOSIS — R45851 Suicidal ideations: Secondary | ICD-10-CM | POA: Diagnosis not present

## 2014-10-01 DIAGNOSIS — F332 Major depressive disorder, recurrent severe without psychotic features: Secondary | ICD-10-CM | POA: Diagnosis not present

## 2014-10-01 DIAGNOSIS — F32A Depression, unspecified: Secondary | ICD-10-CM | POA: Insufficient documentation

## 2014-10-01 DIAGNOSIS — F329 Major depressive disorder, single episode, unspecified: Secondary | ICD-10-CM | POA: Insufficient documentation

## 2014-10-01 LAB — CULTURE, BLOOD (ROUTINE X 2)
Culture: NO GROWTH
Culture: NO GROWTH

## 2014-10-01 LAB — CBG MONITORING, ED
GLUCOSE-CAPILLARY: 217 mg/dL — AB (ref 65–99)
GLUCOSE-CAPILLARY: 108 mg/dL — AB (ref 65–99)
Glucose-Capillary: 88 mg/dL (ref 65–99)
Glucose-Capillary: 124 mg/dL — ABNORMAL HIGH (ref 65–99)
Glucose-Capillary: 52 mg/dL — ABNORMAL LOW (ref 65–99)

## 2014-10-01 MED ORDER — INSULIN ASPART 100 UNIT/ML ~~LOC~~ SOLN
5.0000 [IU] | Freq: Three times a day (TID) | SUBCUTANEOUS | Status: DC
  Administered 2014-10-01 – 2014-10-02 (×4): 5 [IU] via SUBCUTANEOUS
  Filled 2014-10-01 (×4): qty 1

## 2014-10-01 MED ORDER — LORAZEPAM 1 MG PO TABS
1.0000 mg | ORAL_TABLET | ORAL | Status: DC | PRN
  Administered 2014-10-01 – 2014-10-02 (×2): 1 mg via ORAL
  Filled 2014-10-01 (×2): qty 1

## 2014-10-01 MED ORDER — INSULIN ASPART 100 UNIT/ML ~~LOC~~ SOLN
0.0000 [IU] | Freq: Three times a day (TID) | SUBCUTANEOUS | Status: DC
  Administered 2014-10-01: 2 [IU] via SUBCUTANEOUS
  Administered 2014-10-01: 5 [IU] via SUBCUTANEOUS
  Administered 2014-10-02: 2 [IU] via SUBCUTANEOUS
  Administered 2014-10-02: 3 [IU] via SUBCUTANEOUS
  Filled 2014-10-01 (×3): qty 1

## 2014-10-01 MED ORDER — OXYCODONE-ACETAMINOPHEN 5-325 MG PO TABS
1.0000 | ORAL_TABLET | ORAL | Status: DC | PRN
  Administered 2014-10-01 – 2014-10-02 (×4): 1 via ORAL
  Filled 2014-10-01 (×4): qty 1

## 2014-10-01 MED ORDER — SODIUM CHLORIDE 0.9 % IV SOLN
Freq: Once | INTRAVENOUS | Status: AC
  Administered 2014-10-01: 16:00:00 via INTRAVENOUS

## 2014-10-01 MED ORDER — INSULIN GLARGINE 100 UNIT/ML ~~LOC~~ SOLN
20.0000 [IU] | Freq: Every day | SUBCUTANEOUS | Status: DC
  Filled 2014-10-01 (×2): qty 0.2

## 2014-10-01 MED ORDER — DOXYCYCLINE HYCLATE 100 MG PO TABS
100.0000 mg | ORAL_TABLET | Freq: Two times a day (BID) | ORAL | Status: DC
  Administered 2014-10-01 – 2014-10-02 (×3): 100 mg via ORAL
  Filled 2014-10-01 (×3): qty 1

## 2014-10-01 MED ORDER — CLINDAMYCIN PHOSPHATE 900 MG/50ML IV SOLN
900.0000 mg | Freq: Once | INTRAVENOUS | Status: AC
  Administered 2014-10-01: 900 mg via INTRAVENOUS
  Filled 2014-10-01: qty 50

## 2014-10-01 NOTE — ED Notes (Signed)
TTS PRESENT 

## 2014-10-01 NOTE — ED Notes (Signed)
Patient with c/o right foot pain and anxiety. Dr. Effie ShyWentz notified via phone; new orders obtained. Meds given as ordered. No s/s of distress noted. Pt instructed to elevate foot. Pt agreeable.

## 2014-10-01 NOTE — Progress Notes (Signed)
Per chart review, [facility], psychiatrically stable, and discharged to SNF for short term rehab under LOG. Patient however left facility AMA. Pt then presented to the ED with SI. Per chart review, pt told staff due to not having a place to go, pt became depressed. Per chart review of previous admission pt has been living in his truck and showering at his brothers when needed in [place]. Pt unable to live with pt brother due to living in section 8 housing.   Olga CoasterKristen Tiny Chaudhary, LCSW  Clinical Social Work  [facility] [phone]

## 2014-10-01 NOTE — ED Notes (Signed)
CBG  88- NO INSULIN UNITS GIVEN

## 2014-10-01 NOTE — ED Notes (Addendum)
RECENT AMPUTATION OF TOES TO RIGHT FOOT DUE TO DIABETES. DRESSING INTACT. SENT TO NURSING HOME. PT HAS NO WHERE TO GO AND BECAME DEPRESSED.

## 2014-10-01 NOTE — ED Notes (Signed)
Pt's CBG now WNL. Pt denies any symptoms at this time.

## 2014-10-01 NOTE — ED Provider Notes (Signed)
I was called by the nurse to evaluate the patient's right foot status post metatarsal amputation. The surgery was at the end of last month. Patient is not any fevers but over last 1 day has noticed redness and swelling to his distal foot. He does have erythema with mild warmth surrounding the wound, no purulent drainage but does have weeping. White blood cell count from yesterday was 10.7. His surgeon consulted, Dr. Lajoyce Cornersuda; Dr. Magnus IvanBlackman taking call for him today. Recommends IV dose of clinda (900 mg) and d/c with 2 weeks doxycycline. Xray is negative.  Tyler LovelessScott Elek Holderness, MD 10/01/14 [phone]

## 2014-10-01 NOTE — Progress Notes (Signed)
[facility] spoke with [facility] to check on pt PT eval services at [phone] CM spoke with pt who [place].  CM discussed and provided written information for self pay pcps, discussed the importance of pcp vs EDP services for f/u care, www.needymeds.org, www.goodrx.com, discounted pharmacies and other [place] resources such as [facility] , [facility], affordable care act,  [place] med assist, financial assistance, self pay dental services, [place] med assist, DSS and  health department  Reviewed resources for [place] self pay pcps like [facility] [place], family medicine at [address], [facility], [facility], local urgent care centers, [facility], [facility] family practice, general medical clinics, family services of the [place], [facility] urgent care plus others, medication resources, [facility] out patient pharmacies and housing Pt voiced understanding and appreciation of resources provided   Provided P4CC contact information Pt is homeless not a candidate for P4CC [place]    CM also offered pt the following resources  [facility] Prescription Assistance Program [facility]  [place], [place] - [phone]   [facility] [address] [place] [address] [place] [facility] [place], [place]- [phone]
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
## 2014-10-01 NOTE — Consult Note (Signed)
[facility] Face-to-Face Psychiatry Consult   Reason for Consult:  Major depressive disorder, recurrent, sever,  Referring Physician:  EDP Patient Identification: Tyler Burgess MRN:  [id number] Principal Diagnosis: MDD (major depressive disorder), recurrent severe, without psychosis Diagnosis:   Patient Active Problem List   Diagnosis Date Noted  . MDD (major depressive disorder), recurrent severe, without psychosis [F33.2] 04/11/2014    Priority: High  . Diabetic ketoacidosis without coma associated with type 2 diabetes mellitus [E13.10]   . Weakness [R53.1]   . DKA (diabetic ketoacidoses) [E13.10] 09/15/2014  . Hyponatremia [E87.1] 09/15/2014  . DKA, type 2, not at goal [E13.10] 09/15/2014  . Suicidal ideations [R45.851]   . Diarrhea [R19.7]   . Enteritis due to Clostridium difficile [A04.7] 09/05/2014  . Osteomyelitis [M86.9] 08/31/2014  . Sepsis [A41.9] 08/31/2014  . Substance abuse [F19.10]   . Suicidal ideation [R45.851]   . Type 2 diabetes mellitus with hyperglycemia [E11.65] 04/15/2014  . Suicidal behavior [F48.9] 04/11/2014  . GAD (generalized anxiety disorder) [F41.1] 04/11/2014  . Alcohol use disorder, severe, dependence [F10.20] 04/11/2014  . Opioid use disorder, moderate, in sustained remission [F11.90] 04/11/2014  . Opioid dependence in remission [F11.21] 01/10/2014  . Severe major depression without psychotic features [F32.2] 01/09/2014  . 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 Type 2 diabetes mellitus with peripheral neuropathy [E11.40] 12/11/2013  . 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 male patient admitted with Major depressive disorder, .  HPI:  Caucasian male, 59 years old brought self in from a nursing home where he was recuperating from a foot surgery.  Patient left the facility AMA because he did not like seeing old people looking "dead"  Patient reports that he  was feeling intense depression and suicidal with plans to hang himself.  He also state that Nursing home staff refused to give him his pain medications.  Patient also reported that he started drinking Alcohol to elevate his mood.  Patient was discharged from [facility] after his surgical procedure and was homeless before that.  He is not able to contract for safety and has been accepted for admission.  We will be seeking placement at any facility with available beds.  HPI Elements:   Location:  Major depressive disorder, recurrent, severe. Quality:  severe, feeling suicidal, . Severity:  severe. Timing:  acute. Duration:  Chronic mental and medical issues.. Context:  Left Nursing home AMA.  Past Medical History:  Past Medical History  Diagnosis Date  . Diabetes mellitus without complication   . Depression     Past Surgical History  Procedure Laterality Date  . Right arm    . Cholecystectomy    . Amputation toe Right 09/03/2014    Procedure: AMPUTATION RIGHT GREAT TOE AND SECOND TOE;  Surgeon: Aviva Signs Md, MD;  Location: [facility];  Service: General;  Laterality: Right;  right great and second toes  . Amputation Right 09/20/2014    Procedure: RIGHT TRANSMETATARSAL AMPUTATION;  Surgeon: Newt Minion, MD;  Location: [facility];  Service: Orthopedics;  Laterality: Right;   Family History:  Family History  Problem Relation Age of Onset  . Diabetes Mother   . Depression Father   . Depression Brother   . Diabetes Other   . Depression Other    Social History:  History  Alcohol Use  . 1.2 oz/week  . 2 Cans of beer per week  Comment: once per month     History  Drug Use No    History   Social History  . Marital Status: Widowed    Spouse Name: N/A  . Number of Children: N/A  . Years of Education: N/A   Social History Main Topics  . Smoking status: Current Every Day Smoker -- 0.50 packs/day for 35 years    Types: Cigarettes  . Smokeless tobacco: Not on file  . Alcohol Use:  1.2 oz/week    2 Cans of beer per week     Comment: once per month  . Drug Use: No  . Sexual Activity: No   Other Topics Concern  . None   Social History Narrative   Homeless and supposed to go to a Shelter in [place].  [facility] could not get medications right and so he took a loaded pistol into the center and had to be jailed for 9 days.  Gets around in a truck.  Lives in his truck and occasionally visits his brother.     Additional Social History:                          Allergies:  No Known Allergies  Labs:  Results for orders placed or performed during the hospital encounter of 09/30/14 (from the past 48 hour(s))  Urine rapid drug screen (hosp performed)     Status: None   Collection Time: 09/30/14 12:38 PM  Result Value Ref Range   Opiates NONE DETECTED NONE DETECTED   Cocaine NONE DETECTED NONE DETECTED   Benzodiazepines NONE DETECTED NONE DETECTED   Amphetamines NONE DETECTED NONE DETECTED   Tetrahydrocannabinol NONE DETECTED NONE DETECTED   Barbiturates NONE DETECTED NONE DETECTED    Comment:        DRUG SCREEN FOR MEDICAL PURPOSES ONLY.  IF CONFIRMATION IS NEEDED FOR ANY PURPOSE, NOTIFY LAB WITHIN 5 DAYS.        LOWEST DETECTABLE LIMITS FOR URINE DRUG SCREEN Drug Class       Cutoff (ng/mL) Amphetamine      1000 Barbiturate      200 Benzodiazepine   409 Tricyclics       811 Opiates          300 Cocaine          300 THC              50   Basic metabolic panel     Status: Abnormal   Collection Time: 09/30/14  2:23 PM  Result Value Ref Range   Sodium 136 135 - 145 mmol/L   Potassium 4.4 3.5 - 5.1 mmol/L   Chloride 99 (L) 101 - 111 mmol/L   CO2 29 22 - 32 mmol/L   Glucose, Bld 377 (H) 65 - 99 mg/dL   BUN 18 6 - 20 mg/dL   Creatinine, Ser 1.22 0.61 - 1.24 mg/dL   Calcium 8.6 (L) 8.9 - 10.3 mg/dL   GFR calc non Af Amer >60 >60 mL/min   GFR calc Af Amer >60 >60 mL/min    Comment: (NOTE) The eGFR has been calculated using the CKD EPI  equation. This calculation has not been validated in all clinical situations. eGFR's persistently <60 mL/min signify possible Chronic Kidney Disease.    Anion gap 8 5 - 15  CBC with Differential     Status: Abnormal   Collection Time: 09/30/14  2:23 PM  Result Value Ref Range  WBC 10.7 (H) 4.0 - 10.5 K/uL   RBC 3.41 (L) 4.22 - 5.81 MIL/uL   Hemoglobin 10.9 (L) 13.0 - 17.0 g/dL   HCT 31.9 (L) 39.0 - 52.0 %   MCV 93.5 78.0 - 100.0 fL   MCH 32.0 26.0 - 34.0 pg   MCHC 34.2 30.0 - 36.0 g/dL   RDW 13.3 11.5 - 15.5 %   Platelets 438 (H) 150 - 400 K/uL   Neutrophils Relative % 60 43 - 77 %   Lymphocytes Relative 30 12 - 46 %   Monocytes Relative 8 3 - 12 %   Eosinophils Relative 1 0 - 5 %   Basophils Relative 1 0 - 1 %   Neutro Abs 6.4 1.7 - 7.7 K/uL   Lymphs Abs 3.2 0.7 - 4.0 K/uL   Monocytes Absolute 0.9 0.1 - 1.0 K/uL   Eosinophils Absolute 0.1 0.0 - 0.7 K/uL   Basophils Absolute 0.1 0.0 - 0.1 K/uL   Smear Review MORPHOLOGY UNREMARKABLE   Ethanol     Status: None   Collection Time: 09/30/14  2:23 PM  Result Value Ref Range   Alcohol, Ethyl (B) <5 <5 mg/dL    Comment:        LOWEST DETECTABLE LIMIT FOR SERUM ALCOHOL IS 11 mg/dL FOR MEDICAL PURPOSES ONLY   CBG monitoring, ED     Status: Abnormal   Collection Time: 09/30/14  4:41 PM  Result Value Ref Range   Glucose-Capillary 283 (H) 65 - 99 mg/dL  CBG monitoring, ED     Status: Abnormal   Collection Time: 09/30/14  9:41 PM  Result Value Ref Range   Glucose-Capillary 219 (H) 65 - 99 mg/dL   Comment 1 Document in Chart    Comment 2 Repeat Test   CBG monitoring, ED     Status: None   Collection Time: 10/01/14  8:17 AM  Result Value Ref Range   Glucose-Capillary 88 65 - 99 mg/dL    Vitals: Blood pressure 151/73, pulse 93, temperature 97.3 F (36.3 C), temperature source Oral, resp. rate 18, height 5' 10"  (1.778 m), weight 75.751 kg (167 lb), SpO2 100 %.  Risk to Self: Is patient at risk for suicide?: Yes Risk to  Others:   Prior Inpatient Therapy:   Prior Outpatient Therapy:    Current Facility-Administered Medications  Medication Dose Route Frequency Provider Last Rate Last Dose  . acetaminophen (TYLENOL) tablet 650 mg  650 mg Oral Q6H PRN Francine Graven, DO      . ARIPiprazole (ABILIFY) tablet 5 mg  5 mg Oral BID Francine Graven, DO   5 mg at 10/01/14 1050  . aspirin chewable tablet 81 mg  81 mg Oral Daily Francine Graven, DO   81 mg at 10/01/14 1053  . benztropine (COGENTIN) tablet 0.5 mg  0.5 mg Oral BID Francine Graven, DO   0.5 mg at 10/01/14 1055  . gabapentin (NEURONTIN) capsule 300 mg  300 mg Oral TID Francine Graven, DO   300 mg at 10/01/14 1053  . insulin aspart (novoLOG) injection 0-15 Units  0-15 Units Subcutaneous TID WC Francine Graven, DO      . insulin aspart (novoLOG) injection 5 Units  5 Units Subcutaneous TID WC Francine Graven, DO      . insulin glargine (LANTUS) injection 20 Units  20 Units Subcutaneous QHS Francine Graven, DO      . multivitamin with minerals tablet 1 tablet  1 tablet Oral Daily Francine Graven, DO   1  tablet at 10/01/14 1053  . saccharomyces boulardii (FLORASTOR) capsule 250 mg  250 mg Oral BID Francine Graven, DO   250 mg at 10/01/14 1051  . senna-docusate (Senokot-S) tablet 1 tablet  1 tablet Oral QHS PRN Francine Graven, DO      . sertraline (ZOLOFT) tablet 100 mg  100 mg Oral Daily Francine Graven, DO   100 mg at 10/01/14 1054   Current Outpatient Prescriptions  Medication Sig Dispense Refill  . acetaminophen (TYLENOL) 325 MG tablet Take 2 tablets (650 mg total) by mouth every 6 (six) hours as needed for mild pain (or Fever >/= 101). 30 tablet 0  . ARIPiprazole (ABILIFY) 5 MG tablet Take 1 tablet (5 mg total) by mouth 2 (two) times daily. 60 tablet 0  . aspirin 81 MG chewable tablet Chew 1 tablet (81 mg total) by mouth daily. 30 tablet 0  . benztropine (COGENTIN) 0.5 MG tablet Take 1 tablet (0.5 mg total) by mouth 2 (two) times daily. 60 tablet  0  . gabapentin (NEURONTIN) 300 MG capsule Take 1 capsule (300 mg total) by mouth 3 (three) times daily. 90 capsule 0  . insulin aspart (NOVOLOG) 100 UNIT/ML injection Inject 5 Units into the skin 3 (three) times daily with meals. 10 mL 11  . insulin glargine (LANTUS) 100 UNIT/ML injection Inject 0.2 mLs (20 Units total) into the skin at bedtime. 10 mL 11  . saccharomyces boulardii (FLORASTOR) 250 MG capsule Take 1 capsule (250 mg total) by mouth 2 (two) times daily. 60 capsule 0  . senna-docusate (SENOKOT-S) 8.6-50 MG per tablet Take 1 tablet by mouth at bedtime as needed for mild constipation. 10 tablet 0  . sertraline (ZOLOFT) 100 MG tablet Take 1 tablet (100 mg total) by mouth daily. 30 tablet 0  . Multiple Vitamin (MULTIVITAMIN WITH MINERALS) TABS tablet Take 1 tablet by mouth daily. (Patient not taking: Reported on 09/15/2014)      Musculoskeletal: Strength & Muscle Tone: within normal limits Gait & Station: normal Patient leans: N/A  Psychiatric Specialty Exam: Physical Exam  Review of Systems  Constitutional: Negative.   HENT: Negative.   Eyes: Negative.   Respiratory: Negative.   Cardiovascular: Negative.   Gastrointestinal: Negative.   Genitourinary: Negative.   Musculoskeletal:       S/p left foot amputation two weeks ago.  Skin: Negative.   Neurological: Negative.   Endo/Heme/Allergies: Negative.     Blood pressure 151/73, pulse 93, temperature 97.3 F (36.3 C), temperature source Oral, resp. rate 18, height 5' 10"  (1.778 m), weight 75.751 kg (167 lb), SpO2 100 %.Body mass index is 23.96 kg/(m^2).  General Appearance: Casual and Disheveled  Eye Contact::  Good  Speech:  Clear and Coherent and Normal Rate  Volume:  Normal  Mood:  Anxious, Depressed and Hopeless  Affect:  Congruent  Thought Process:  Coherent, Goal Directed and Intact  Orientation:  Full (Time, Place, and Person)  Thought Content:  WDL  Suicidal Thoughts:  Yes.  with intent/plan  Homicidal  Thoughts:  No  Memory:  Immediate;   Good Recent;   Good Remote;   Good  Judgement:  Impaired  Insight:  Shallow  Psychomotor Activity:  Normal  Concentration:  Fair  Recall:  NA  Fund of Knowledge:Fair  Language: Good  Akathisia:  NA  Handed:  Right  AIMS (if indicated):     Assets:  Desire for Improvement  ADL's:  Intact  Cognition: WNL  Sleep:      Medical Decision  Making: Review of Psycho-Social Stressors (1), Established Problem, Worsening (2), Review of Medication Regimen & Side Effects (2) and Review of New Medication or Change in Dosage (2)  Treatment Plan Summary: Daily contact with patient to assess and evaluate symptoms and progress in treatment and Medication management  Plan:  Resume Abilify 5 mg po daily for depressed mood, Zoloft 100 mg po daily for depression, Cogentin 0.5 mg po for EPS, Gabapentin 300 mg po tid for pain and mood.  Disposition: Admit and look for placement  Delfin Gant    PMHNP-BC  10/01/2014 1:06 PM Patient seen face-to-face for psychiatric evaluation, chart reviewed and case discussed with the physician extender and developed treatment plan. Reviewed the information documented and agree with the treatment plan. Corena Pilgrim, MD

## 2014-10-01 NOTE — Hospital Discharge Follow-Up (Signed)
Discussed patient's status with Edd ArbourKimberly Gibbs, CM.  She noted that he is still in the ED and the SW is working with him regarding options after discharge.

## 2014-10-01 NOTE — ED Notes (Addendum)
PHYSICAL THERAPY HERE TO EVALUATE THIS PT WITH WALKER. PT IS PARTIAL WEIGHT BEARING WITH SHOE ON. NON WEIGHT BEARING WITHOUT SHOE. PT IS TO HAVE WALKER WHILE AMBULATING.

## 2014-10-01 NOTE — Progress Notes (Signed)
[facility] ED CM and ED SW spoke about pt disposition Orders [facility] [place] [facility]

## 2014-10-01 NOTE — ED Notes (Signed)
Patient given two glasses of orange juice with sugar added as well as graham crackers and peanut butter. Pt alert and oriented with no s/s of distress noted. Pt asymptomatic at this time. Sitter remains at bedside.

## 2014-10-01 NOTE — ED Notes (Signed)
Patient transported to X-ray 

## 2014-10-02 DIAGNOSIS — R45851 Suicidal ideations: Secondary | ICD-10-CM

## 2014-10-02 DIAGNOSIS — F332 Major depressive disorder, recurrent severe without psychotic features: Secondary | ICD-10-CM

## 2014-10-02 LAB — CBG MONITORING, ED
Glucose-Capillary: 142 mg/dL — ABNORMAL HIGH (ref 65–99)
Glucose-Capillary: 171 mg/dL — ABNORMAL HIGH (ref 65–99)

## 2014-10-02 NOTE — Progress Notes (Signed)
CM received a return call from Altona[place]isa Hill to state pt [place], had a state hearing, was denied, he appealed and received a second denial  Misty StanleyLisa recommends [place] re [place] if [place] is his choice

## 2014-10-02 NOTE — ED Notes (Signed)
Patient with complaints of anxiety and right foot pain. PRN medications given as ordered. No s/s of distress noted.

## 2014-10-02 NOTE — Care Management Note (Addendum)
Case Management Note  Patient Details  Name: Tyler LoganBilly Burgess MRN: [id number] Date of Birth: 10/05/1955  Subjective/Objective:       [age] yr odl male from [place]  homeless [place] brother in [place] who has section 8 and pt is  Unable to stay with brother. He is able to go there for respite briefly Pt living in his truck. Pt does not want to return to [place] and [place] [facility].  Pt walked 180 feet with PT on 10/01/14 in TCU with walker.  Pt does have a walker he brought in with him. Pt reports he can not pay for HHPT out of pocket [facility]  [place] to receive "better help" Action/Plan: 1219 Left message for brother, Cristal DeerChristopher, to return a call to CM [phone] 1221 [facility] with [facility] who [facility] not be able provide [facility] or shelter 1) no primary caregiver 2) [place] he [place] be [place] self [place] and medicaid would not provided HHPT 1225 ED SW updated  1239 Pt updated with ED SW present Pt encouraged to [facility] [place] [facility] to re apply for medicaid Pt stated to SW & Cm he "cancelled my medicaid. I no longer have medicaid" Pt has already [place] Pt given DSS [place] info in d/c instructions 1248 Cm [place] [phone] lisa hill Left a vlice message requesting at return call to CM or to pt at [phone]    Expected Discharge Date:   October 02 2014               Expected Discharge Plan:   [facility]   In-House Referral:   ED SW   Discharge planning Services    [facility]  Post Acute Care Choice:   home health  Choice offered to:   pt   DME Arranged:   na DME Agency:   na  HH Arranged:   na  HH Agency:   na  Status of Service:   completed   If discussed at Long Length of Stay Meetings, dates discussed:  10/02/14   Additional  Comments: 1317 CM left message at [facility] for Lupita LeashDonna [phone]  Ophelia ShoulderGibbs, Phenix Vandermeulen Louise, RN 10/02/2014, 12:28 PM

## 2014-10-02 NOTE — Progress Notes (Signed)
Pt denies Si/HI/Ah/VH. Patient reports continuing depression however able to contract for safety. Pt plans [facility] if needed. Pt plans to see if [facility] can [facility], or he will continue to stay in his truck. Per pt, pt brother can drive [place]. RN CM assisting with medication assistance and home health services.   Olga CoasterKristen Emerlyn Mehlhoff, LCSW  Clinical Social Work  [facility] [phone]

## 2014-10-02 NOTE — Discharge Instructions (Signed)
[facility] provides supportive services for the homeless in the community.  Contact them at your earliest opportunity to enroll in their services:       [facility]      [address]      [place], [place] [address]      [phone]

## 2014-10-02 NOTE — [facility] Suicide Risk Assessment (Signed)
Suicide Risk Assessment  Discharge Assessment   [facility] Discharge Suicide Risk Assessment   Demographic Factors:  Male, Caucasian and Living alone  Total Time spent with patient: 30 minutes   Musculoskeletal: Strength & Muscle Tone: within normal limits Gait & Station: normal Patient leans: N/A  Psychiatric Specialty Exam: Physical Exam  Review of Systems  Constitutional: Negative.   HENT: Negative.   Eyes: Negative.   Respiratory: Negative.   Cardiovascular: Negative.   Gastrointestinal: Negative.   Genitourinary: Negative.   Musculoskeletal: Negative.   Skin: Negative.   Neurological: Negative.   Endo/Heme/Allergies: Negative.   Psychiatric/Behavioral: Positive for depression.    Blood pressure 131/83, pulse 55, temperature 98.5 F (36.9 C), temperature source Oral, resp. rate 20, height  (1.778 m), weight 75.751 kg (167 lb), SpO2 100 %.Body mass index is 23.96 kg/(m^2).  General Appearance: Casual and Disheveled  Eye Contact::  Good  Speech:  Clear and Coherent and Normal Rate  Volume:  Normal  Mood:  Depression  Affect:  Congruent  Thought Process:  Coherent, Goal Directed and Intact  Orientation:  Full (Time, Place, and Person)  Thought Content:  WDL  Suicidal Thoughts:  No  Homicidal Thoughts:  No  Memory:  Immediate;   Good Recent;   Good Remote;   Good  Judgement: Fair  Insight:  Fair  Psychomotor Activity:  Normal  Concentration:  Good  Recall:  NA  Fund of Knowledge: Good  Language: Good  Akathisia:  NA  Handed:  Right  AIMS (if indicated):     Assets:  Desire for Improvement  ADL's:  Intact  Cognition: WNL  Sleep:         Has this patient used any form of tobacco in the last 30 days? (Cigarettes, Smokeless Tobacco, Cigars, and/or Pipes) No  Mental Status Per Nursing Assessment::   On Admission:   Suicidal ideations  Current Mental Status by Physician: NA  Loss Factors: NA  Historical Factors: NA  Risk Reduction Factors:    Sense of responsibility to family and Positive social support  Continued Clinical Symptoms:  Mild depression  Cognitive Features That Contribute To Risk:  None    Suicide Risk:  Minimal: No identifiable suicidal ideation.  Patients presenting with no risk factors but with morbid ruminations; may be classified as minimal risk based on the severity of the depressive symptoms  Principal Problem: MDD (major depressive disorder), recurrent severe, without psychosis Discharge Diagnoses:  Patient Active Problem List   Diagnosis Date Noted  . MDD (major depressive disorder), recurrent severe, without psychosis [F33.2] 04/11/2014    Priority: High  . Depression [F32.9]   . Diabetic ketoacidosis without coma associated with type 2 diabetes mellitus [E13.10]   . Weakness [R53.1]   . DKA (diabetic ketoacidoses) [E13.10] 09/15/2014  . Hyponatremia [E87.1] 09/15/2014  . DKA, type 2, not at goal [E13.10] 09/15/2014  . Suicidal ideations [R45.851]   . Diarrhea [R19.7]   . Enteritis due to Clostridium difficile [A04.7] 09/05/2014  . Osteomyelitis [M86.9] 08/31/2014  . Sepsis [A41.9] 08/31/2014  . Substance abuse [F19.10]   . Suicidal ideation [R45.851]   . Type 2 diabetes mellitus with hyperglycemia [E11.65] 04/15/2014  . Suicidal behavior [F48.9] 04/11/2014  . GAD (generalized anxiety disorder) [F41.1] 04/11/2014  . Alcohol use disorder, severe, dependence [F10.20] 04/11/2014  . Opioid use disorder, moderate, in sustained remission [F11.90] 04/11/2014  . Opioid dependence in remission [F11.21] 01/10/2014  . Severe major depression without psychotic features [F32.2] 01/09/2014  . Affective bipolar  disorder [F31.9] 12/11/2013  . Type 2 diabetes mellitus with peripheral neuropathy [E11.40] 12/11/2013  . Nonpsychotic mental disorder [F48.9] 12/11/2013    Follow-up Information    Call [place] DSS.   Why:  A message has been left for you with Adult medicaid staff member Myrla HalstedLisa Hill Pending a  call back from her    Contact information:   [phone]      Plan Of Care/Follow-up recommendations:  Activity:  as tolerated Diet:  heart healthy diet  Is patient on multiple antipsychotic therapies at discharge:  No   Has Patient had three or more failed trials of antipsychotic monotherapy by history:  No  Recommended Plan for Multiple Antipsychotic Therapies: NA    Tyler Burgess, PMH-NP 10/02/2014, 1:48 PM

## 2014-10-02 NOTE — Progress Notes (Signed)
CM [facility] [phone] who states [facility] coordinator and he will be set up with medical service for [facility] Pt may see carla or madelyn  1354 CM spoke with TCU RN and ED SW about [facility] to see coordinator Requested TCU RN make pt aware

## 2014-10-02 NOTE — Consult Note (Signed)
[facility] Face-to-Face Psychiatry Consult   Reason for Consult:  Major depressive disorder, recurrent, severe Referring Physician:  EDP Patient Identification: Tyler Burgess MRN:  [id number] Principal Diagnosis: MDD (major depressive disorder), recurrent severe, without psychosis Diagnosis:   Patient Active Problem List   Diagnosis Date Noted  . MDD (major depressive disorder), recurrent severe, without psychosis [F33.2] 04/11/2014    Priority: High  . Depression [F32.9]   . Diabetic ketoacidosis without coma associated with type 2 diabetes mellitus [E13.10]   . Weakness [R53.1]   . DKA (diabetic ketoacidoses) [E13.10] 09/15/2014  . Hyponatremia [E87.1] 09/15/2014  . DKA, type 2, not at goal [E13.10] 09/15/2014  . Suicidal ideations [R45.851]   . Diarrhea [R19.7]   . Enteritis due to Clostridium difficile [A04.7] 09/05/2014  . Osteomyelitis [M86.9] 08/31/2014  . Sepsis [A41.9] 08/31/2014  . Substance abuse [F19.10]   . Suicidal ideation [R45.851]   . Type 2 diabetes mellitus with hyperglycemia [E11.65] 04/15/2014  . Suicidal behavior [F48.9] 04/11/2014  . GAD (generalized anxiety disorder) [F41.1] 04/11/2014  . Alcohol use disorder, severe, dependence [F10.20] 04/11/2014  . Opioid use disorder, moderate, in sustained remission [F11.90] 04/11/2014  . Opioid dependence in remission [F11.21] 01/10/2014  . Severe major depression without psychotic features [F32.2] 01/09/2014  . 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 Type 2 diabetes mellitus with peripheral neuropathy [E11.40] 12/11/2013  . 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 male patient has stabilized.  HPI: The patient has been homeless for 5 years.  He states he is depressed and suicidal because he does not have a place to live besides his truck and "can't get any help."  However, he was at [facility] and discharged on 6/1 to the  [facility] where he left AMA on 6/4 because he did not like it there.  He became more depressed and started having suicidal ideations and came to the ED.  Social work was able to arrange for him to go to the [facility], homeless shelter, and get him a follow-up appointment with his PCP.  He was not suicidal on assessment this morning, nor with the social worker this afternoon.  Tyler Burgess is aggreeable to the treatment plan.  HPI Elements:   Location:  Major depressive disorder, recurrent, severe. Quality:  severe, feeling suicidal, . Severity:  severe. Timing:  acute. Duration:  Chronic mental and medical issues.. Context:  Left Nursing home AMA.  Past Medical History:  Past Medical History  Diagnosis Date  . Diabetes mellitus without complication   . Depression     Past Surgical History  Procedure Laterality Date  . Right arm    . Cholecystectomy    . Amputation toe Right 09/03/2014    Procedure: AMPUTATION RIGHT GREAT TOE AND SECOND TOE;  Surgeon: Aviva Signs Md, MD;  Location: [facility];  Service: General;  Laterality: Right;  right great and second toes  . Amputation Right 09/20/2014    Procedure: RIGHT TRANSMETATARSAL AMPUTATION;  Surgeon: Newt Minion, MD;  Location: [facility];  Service: Orthopedics;  Laterality: Right;   Family History:  Family History  Problem Relation Age of Onset  . Diabetes Mother   . Depression Father   . Depression Brother   . Diabetes Other   . Depression Other    Social History:  History  Alcohol Use  . 1.2 oz/week  . 2 Cans of beer per week  Comment: once per month     History  Drug Use No    History   Social History  . Marital Status: Widowed    Spouse Name: N/A  . Number of Children: N/A  . Years of Education: N/A   Social History Main Topics  . Smoking status: Current Every Day Smoker -- 0.50 packs/day for 35 years    Types: Cigarettes  . Smokeless tobacco: Not on file  . Alcohol Use: 1.2 oz/week    2 Cans of beer per week     Comment:  once per month  . Drug Use: No  . Sexual Activity: No   Other Topics Concern  . None   Social History Narrative   Homeless and supposed to go to a Shelter in [place].  [facility] could not get medications right and so he took a loaded pistol into the center and had to be jailed for 9 days.  Gets around in a truck.  Lives in his truck and occasionally visits his brother.     Additional Social History:                          Allergies:  No Known Allergies  Labs:  Results for orders placed or performed during the hospital encounter of 09/30/14 (from the past 48 hour(s))  Basic metabolic panel     Status: Abnormal   Collection Time: 09/30/14  2:23 PM  Result Value Ref Range   Sodium 136 135 - 145 mmol/L   Potassium 4.4 3.5 - 5.1 mmol/L   Chloride 99 (L) 101 - 111 mmol/L   CO2 29 22 - 32 mmol/L   Glucose, Bld 377 (H) 65 - 99 mg/dL   BUN 18 6 - 20 mg/dL   Creatinine, Ser 1.22 0.61 - 1.24 mg/dL   Calcium 8.6 (L) 8.9 - 10.3 mg/dL   GFR calc non Af Amer >60 >60 mL/min   GFR calc Af Amer >60 >60 mL/min    Comment: (NOTE) The eGFR has been calculated using the CKD EPI equation. This calculation has not been validated in all clinical situations. eGFR's persistently <60 mL/min signify possible Chronic Kidney Disease.    Anion gap 8 5 - 15  CBC with Differential     Status: Abnormal   Collection Time: 09/30/14  2:23 PM  Result Value Ref Range   WBC 10.7 (H) 4.0 - 10.5 K/uL   RBC 3.41 (L) 4.22 - 5.81 MIL/uL   Hemoglobin 10.9 (L) 13.0 - 17.0 g/dL   HCT 31.9 (L) 39.0 - 52.0 %   MCV 93.5 78.0 - 100.0 fL   MCH 32.0 26.0 - 34.0 pg   MCHC 34.2 30.0 - 36.0 g/dL   RDW 13.3 11.5 - 15.5 %   Platelets 438 (H) 150 - 400 K/uL   Neutrophils Relative % 60 43 - 77 %   Lymphocytes Relative 30 12 - 46 %   Monocytes Relative 8 3 - 12 %   Eosinophils Relative 1 0 - 5 %   Basophils Relative 1 0 - 1 %   Neutro Abs 6.4 1.7 - 7.7 K/uL   Lymphs Abs 3.2 0.7 - 4.0 K/uL    Monocytes Absolute 0.9 0.1 - 1.0 K/uL   Eosinophils Absolute 0.1 0.0 - 0.7 K/uL   Basophils Absolute 0.1 0.0 - 0.1 K/uL   Smear Review MORPHOLOGY UNREMARKABLE   Ethanol     Status: None   Collection Time:  09/30/14  2:23 PM  Result Value Ref Range   Alcohol, Ethyl (B) <5 <5 mg/dL    Comment:        LOWEST DETECTABLE LIMIT FOR SERUM ALCOHOL IS 11 mg/dL FOR MEDICAL PURPOSES ONLY   CBG monitoring, ED     Status: Abnormal   Collection Time: 09/30/14  4:41 PM  Result Value Ref Range   Glucose-Capillary 283 (H) 65 - 99 mg/dL  CBG monitoring, ED     Status: Abnormal   Collection Time: 09/30/14  9:41 PM  Result Value Ref Range   Glucose-Capillary 219 (H) 65 - 99 mg/dL   Comment 1 Document in Chart    Comment 2 Repeat Test   CBG monitoring, ED     Status: None   Collection Time: 10/01/14  8:17 AM  Result Value Ref Range   Glucose-Capillary 88 65 - 99 mg/dL  CBG monitoring, ED     Status: Abnormal   Collection Time: 10/01/14  1:18 PM  Result Value Ref Range   Glucose-Capillary 217 (H) 65 - 99 mg/dL  CBG monitoring, ED     Status: Abnormal   Collection Time: 10/01/14  6:12 PM  Result Value Ref Range   Glucose-Capillary 124 (H) 65 - 99 mg/dL  CBG monitoring, ED     Status: Abnormal   Collection Time: 10/01/14  9:59 PM  Result Value Ref Range   Glucose-Capillary 52 (L) 65 - 99 mg/dL  CBG monitoring, ED     Status: Abnormal   Collection Time: 10/01/14 10:33 PM  Result Value Ref Range   Glucose-Capillary 108 (H) 65 - 99 mg/dL  CBG monitoring, ED     Status: Abnormal   Collection Time: 10/02/14  8:38 AM  Result Value Ref Range   Glucose-Capillary 142 (H) 65 - 99 mg/dL  CBG monitoring, ED     Status: Abnormal   Collection Time: 10/02/14  1:36 PM  Result Value Ref Range   Glucose-Capillary 171 (H) 65 - 99 mg/dL   Comment 1 Notify RN    Comment 2 Document in Chart     Vitals: Blood pressure 131/83, pulse 55, temperature 98.5 F (36.9 C), temperature source Oral, resp. rate 20,  height 5' 10"  (1.778 m), weight 75.751 kg (167 lb), SpO2 100 %.  Risk to Self: Is patient at risk for suicide?: Yes Risk to Others:   Prior Inpatient Therapy:   Prior Outpatient Therapy:    Current Facility-Administered Medications  Medication Dose Route Frequency Provider Last Rate Last Dose  . acetaminophen (TYLENOL) tablet 650 mg  650 mg Oral Q6H PRN Francine Graven, DO      . ARIPiprazole (ABILIFY) tablet 5 mg  5 mg Oral BID Francine Graven, DO   5 mg at 10/02/14 0909  . aspirin chewable tablet 81 mg  81 mg Oral Daily Francine Graven, DO   81 mg at 10/02/14 0909  . benztropine (COGENTIN) tablet 0.5 mg  0.5 mg Oral BID Francine Graven, DO   0.5 mg at 10/02/14 0908  . doxycycline (VIBRA-TABS) tablet 100 mg  100 mg Oral Q12H Sherwood Gambler, MD   100 mg at 10/02/14 0908  . gabapentin (NEURONTIN) capsule 300 mg  300 mg Oral TID Francine Graven, DO   300 mg at 10/02/14 0909  . insulin aspart (novoLOG) injection 0-15 Units  0-15 Units Subcutaneous TID WC Francine Graven, DO   2 Units at 10/02/14 [phone]  . insulin aspart (novoLOG) injection 5 Units  5 Units Subcutaneous TID WC  Francine Graven, DO   5 Units at 10/02/14 [phone]  . insulin glargine (LANTUS) injection 20 Units  20 Units Subcutaneous QHS Francine Graven, DO   20 Units at 10/01/14 2208  . LORazepam (ATIVAN) tablet 1 mg  1 mg Oral Q4H PRN Daleen Bo, MD   1 mg at 10/02/14 0602  . multivitamin with minerals tablet 1 tablet  1 tablet Oral Daily Francine Graven, DO   1 tablet at 10/02/14 0909  . oxyCODONE-acetaminophen (PERCOCET/ROXICET) 5-325 MG per tablet 1 tablet  1 tablet Oral Q4H PRN Daleen Bo, MD   1 tablet at 10/02/14 1218  . saccharomyces boulardii (FLORASTOR) capsule 250 mg  250 mg Oral BID Francine Graven, DO   250 mg at 10/02/14 0909  . senna-docusate (Senokot-S) tablet 1 tablet  1 tablet Oral QHS PRN Francine Graven, DO      . sertraline (ZOLOFT) tablet 100 mg  100 mg Oral Daily Francine Graven, DO   100 mg at  10/02/14 0908   Current Outpatient Prescriptions  Medication Sig Dispense Refill  . acetaminophen (TYLENOL) 325 MG tablet Take 2 tablets (650 mg total) by mouth every 6 (six) hours as needed for mild pain (or Fever >/= 101). 30 tablet 0  . ARIPiprazole (ABILIFY) 5 MG tablet Take 1 tablet (5 mg total) by mouth 2 (two) times daily. 60 tablet 0  . aspirin 81 MG chewable tablet Chew 1 tablet (81 mg total) by mouth daily. 30 tablet 0  . benztropine (COGENTIN) 0.5 MG tablet Take 1 tablet (0.5 mg total) by mouth 2 (two) times daily. 60 tablet 0  . gabapentin (NEURONTIN) 300 MG capsule Take 1 capsule (300 mg total) by mouth 3 (three) times daily. 90 capsule 0  . insulin aspart (NOVOLOG) 100 UNIT/ML injection Inject 5 Units into the skin 3 (three) times daily with meals. 10 mL 11  . insulin glargine (LANTUS) 100 UNIT/ML injection Inject 0.2 mLs (20 Units total) into the skin at bedtime. 10 mL 11  . saccharomyces boulardii (FLORASTOR) 250 MG capsule Take 1 capsule (250 mg total) by mouth 2 (two) times daily. 60 capsule 0  . senna-docusate (SENOKOT-S) 8.6-50 MG per tablet Take 1 tablet by mouth at bedtime as needed for mild constipation. 10 tablet 0  . sertraline (ZOLOFT) 100 MG tablet Take 1 tablet (100 mg total) by mouth daily. 30 tablet 0  . Multiple Vitamin (MULTIVITAMIN WITH MINERALS) TABS tablet Take 1 tablet by mouth daily. (Patient not taking: Reported on 09/15/2014)      Musculoskeletal: Strength & Muscle Tone: within normal limits Gait & Station: normal Patient leans: N/A  Psychiatric Specialty Exam: Physical Exam  Review of Systems  Constitutional: Negative.   HENT: Negative.   Eyes: Negative.   Respiratory: Negative.   Cardiovascular: Negative.   Gastrointestinal: Negative.   Genitourinary: Negative.   Musculoskeletal: Negative.   Skin: Negative.   Neurological: Negative.   Endo/Heme/Allergies: Negative.   Psychiatric/Behavioral: Positive for depression.    Blood pressure  131/83, pulse 55, temperature 98.5 F (36.9 C), temperature source Oral, resp. rate 20, height 5' 10"  (1.778 m), weight 75.751 kg (167 lb), SpO2 100 %.Body mass index is 23.96 kg/(m^2).  General Appearance: Casual and Disheveled  Eye Contact::  Good  Speech:  Clear and Coherent and Normal Rate  Volume:  Normal  Mood:  Depression  Affect:  Congruent  Thought Process:  Coherent, Goal Directed and Intact  Orientation:  Full (Time, Place, and Person)  Thought Content:  WDL  Suicidal Thoughts:  No  Homicidal Thoughts:  No  Memory:  Immediate;   Good Recent;   Good Remote;   Good  Judgement: Fair  Insight:  Fair  Psychomotor Activity:  Normal  Concentration:  Good  Recall:  NA  Fund of Knowledge: Good  Language: Good  Akathisia:  NA  Handed:  Right  AIMS (if indicated):     Assets:  Desire for Improvement  ADL's:  Intact  Cognition: WNL  Sleep:      Medical Decision Making: Review of Psycho-Social Stressors (1), Established Problem, Worsening (2), Review of Medication Regimen & Side Effects (2) and Review of New Medication or Change in Dosage (2)  Treatment Plan Summary: [facility]charge [facility] to [facility] homeless shelter with follow-up with PCP and psychiatry at [facility].  Disposition: Discharge  Waylan Boga    PMHNP-BC  10/02/2014 1:41 PM Patient seen face-to-face for psychiatric evaluation, chart reviewed and case discussed with the physician extender and developed treatment plan. Reviewed the information documented and agree with the treatment plan. Corena Pilgrim, MD

## 2014-10-02 NOTE — Progress Notes (Signed)
CM noted in PDMI that pt has been given match assistance 09/05/14 to 09/12/14 by another [facility] CM  Pt is not eligible for further [facility] services

## 2014-10-02 NOTE — Progress Notes (Signed)
Dear ____________[name]_____________:  Tyler QuinYou have been approved to have the prescriptions written by your discharging physician filled through our [facility] ([facility]) program. This program allows for a one-time (no refills) 34-day supply of selected medications for a low copay amount.  The copay is $3.00 per prescription. For instance, if you have one prescription, you will pay $3.00; for two prescriptions, you pay $6.00; for three prescriptions, you pay $9.00; and so on.  Only certain pharmacies are participating in this program with [facility]. You will need to select one of the pharmacies from the attached list and take your prescriptions, this letter, and your photo ID to one of the participating pharmacies.   We are excited that you are able to use the [facility] program to get your medications. These prescriptions must be filled within 7 days of hospital discharge or they will no longer be valid for the [facility] program. Should you have any problems with your prescriptions please contact your case management team member at [phone].  Thank you,   [facility] MATCH PHARMACIES   [facility], [address], [place], [place] [facility], [address], [place], [place] [facility] [address], [place], [place] [facility],[address], [address], [place], [place] [facility], [address], [place], [place] [address] [facility], [address], [place], [place] [facility], [address], [address], [place], [place]   [facility] [address], [place], [place]   [id number] [address], [place], [place]  [address], [place], [place]   [address], [place], [place] [address], [place], [place]   [address], [place], [place] [address], [place], [place]    [address], [place], [place] [id number], [place], [place]   [id number] [address], [place], [place] [id number] [address], [place], [place]   [address]  [employer]         [address] [place] [address], [place], [place]     [id number], [place], [place] [id number] [address], [place], [place]                                 [address], [place]  [address], [place], [place]                                  [address], [place] [address], [place], [place]                                                           [address], [place] [id number] [address], [place], [place]                              [address], [place] [address], [place], [place]  [place], [place], [place], [place] [address], [place], [place], [place], [place] [employer], [place], [address] [place], [place] [address], [place], [place]                                       [place], [place], [place] [employer], [place], [place]                                           [place], [place], [place], [place], [place], [place], [place], [place], [place], [place], [place], [place], [place]

## 2014-10-03 ENCOUNTER — Encounter (HOSPITAL_COMMUNITY): Payer: Self-pay | Admitting: Emergency Medicine

## 2014-10-03 ENCOUNTER — Emergency Department (HOSPITAL_COMMUNITY)
Admission: EM | Admit: 2014-10-03 | Discharge: 2014-10-03 | Disposition: A | Discharge status: Home / Self Care | Payer: Medicaid - Out of State | Attending: Emergency Medicine | Admitting: Emergency Medicine

## 2014-10-03 ENCOUNTER — Emergency Department (HOSPITAL_COMMUNITY)
Admission: EM | Admit: 2014-10-03 | Discharge: 2014-10-06 | Disposition: A | Discharge status: Home / Self Care | Payer: 59 | Attending: Emergency Medicine | Admitting: Emergency Medicine

## 2014-10-03 DIAGNOSIS — E1165 Type 2 diabetes mellitus with hyperglycemia: Secondary | ICD-10-CM | POA: Insufficient documentation

## 2014-10-03 DIAGNOSIS — Z794 Long term (current) use of insulin: Secondary | ICD-10-CM | POA: Insufficient documentation

## 2014-10-03 DIAGNOSIS — Z79899 Other long term (current) drug therapy: Secondary | ICD-10-CM | POA: Insufficient documentation

## 2014-10-03 DIAGNOSIS — Z9119 Patient's noncompliance with other medical treatment and regimen: Secondary | ICD-10-CM | POA: Insufficient documentation

## 2014-10-03 DIAGNOSIS — F329 Major depressive disorder, single episode, unspecified: Secondary | ICD-10-CM | POA: Insufficient documentation

## 2014-10-03 DIAGNOSIS — R739 Hyperglycemia, unspecified: Secondary | ICD-10-CM

## 2014-10-03 DIAGNOSIS — G8918 Other acute postprocedural pain: Secondary | ICD-10-CM | POA: Insufficient documentation

## 2014-10-03 DIAGNOSIS — F332 Major depressive disorder, recurrent severe without psychotic features: Secondary | ICD-10-CM | POA: Diagnosis present

## 2014-10-03 DIAGNOSIS — R45851 Suicidal ideations: Secondary | ICD-10-CM

## 2014-10-03 DIAGNOSIS — E119 Type 2 diabetes mellitus without complications: Secondary | ICD-10-CM | POA: Insufficient documentation

## 2014-10-03 DIAGNOSIS — Z720 Tobacco use: Secondary | ICD-10-CM | POA: Insufficient documentation

## 2014-10-03 DIAGNOSIS — Z89421 Acquired absence of other right toe(s): Secondary | ICD-10-CM | POA: Insufficient documentation

## 2014-10-03 DIAGNOSIS — M79671 Pain in right foot: Secondary | ICD-10-CM | POA: Insufficient documentation

## 2014-10-03 DIAGNOSIS — Z89411 Acquired absence of right great toe: Secondary | ICD-10-CM | POA: Insufficient documentation

## 2014-10-03 DIAGNOSIS — Z7982 Long term (current) use of aspirin: Secondary | ICD-10-CM | POA: Insufficient documentation

## 2014-10-03 DIAGNOSIS — Z91199 Patient's noncompliance with other medical treatment and regimen due to unspecified reason: Secondary | ICD-10-CM

## 2014-10-03 LAB — COMPREHENSIVE METABOLIC PANEL
ALK PHOS: 122 U/L (ref 38–126)
ALT: 44 U/L (ref 17–63)
AST: 37 U/L (ref 15–41)
Albumin: 3.1 g/dL — ABNORMAL LOW (ref 3.5–5.0)
Anion gap: 9 (ref 5–15)
BILIRUBIN TOTAL: 0.3 mg/dL (ref 0.3–1.2)
BUN: 17 mg/dL (ref 6–20)
CALCIUM: 8.5 mg/dL — AB (ref 8.9–10.3)
CHLORIDE: 101 mmol/L (ref 101–111)
CO2: 24 mmol/L (ref 22–32)
Creatinine, Ser: 1.25 mg/dL — ABNORMAL HIGH (ref 0.61–1.24)
GFR calc Af Amer: >60 mL/min (ref >60)
GFR calc non Af Amer: >60 mL/min (ref >60)
GLUCOSE: 502 mg/dL — AB (ref 65–99)
Potassium: 4.2 mmol/L (ref 3.5–5.1)
Sodium: 134 mmol/L — ABNORMAL LOW (ref 135–145)
Total Protein: 7 g/dL (ref 6.5–8.1)

## 2014-10-03 LAB — ETHANOL: Alcohol, Ethyl (B): <5 mg/dL (ref <5)

## 2014-10-03 LAB — CBC
HCT: 28.8 % — ABNORMAL LOW (ref 39.0–52.0)
Hemoglobin: 9.7 g/dL — ABNORMAL LOW (ref 13.0–17.0)
MCH: 31.3 pg (ref 26.0–34.0)
MCHC: 33.7 g/dL (ref 30.0–36.0)
MCV: 92.9 fL (ref 78.0–100.0)
PLATELETS: 324 10*3/uL (ref 150–400)
RBC: 3.1 MIL/uL — AB (ref 4.22–5.81)
RDW: 13.8 % (ref 11.5–15.5)
WBC: 12.1 10*3/uL — ABNORMAL HIGH (ref 4.0–10.5)

## 2014-10-03 LAB — SALICYLATE LEVEL

## 2014-10-03 LAB — RAPID URINE DRUG SCREEN, HOSP PERFORMED
Amphetamines: NOT DETECTED
Barbiturates: NOT DETECTED
Benzodiazepines: NOT DETECTED
Cocaine: NOT DETECTED
OPIATES: NOT DETECTED
TETRAHYDROCANNABINOL: NOT DETECTED

## 2014-10-03 LAB — ACETAMINOPHEN LEVEL

## 2014-10-03 MED ORDER — HYDROCODONE-ACETAMINOPHEN 5-325 MG PO TABS: 1.0000 | ORAL_TABLET | ORAL | Status: DC | PRN

## 2014-10-03 NOTE — ED Provider Notes (Signed)
CSN: [id number]     Arrival date & time 10/03/14  2130 History   First MD Initiated Contact with Patient 10/03/14 2316     Chief Complaint  Patient presents with  . Suicidal     (Consider location/radiation/quality/duration/timing/severity/associated sxs/prior Treatment) HPI Comments: Patient is a 59 year old male with an extensive past medical history who presents with suicidal ideations. Patient reports he has not been able to get his medication so he has not been taking them. He reports being frustrated with his foot and chronic infection and feeling like "he wants to end it." He reports his plan would be to cut himself with a razor blade that he brought to the ED. He denies HI. He denies alcohol or drug use.     Past Medical History  Diagnosis Date  . Diabetes mellitus without complication   . Depression    Past Surgical History  Procedure Laterality Date  . Right arm    . Cholecystectomy    . Amputation toe Right 09/03/2014    Procedure: AMPUTATION RIGHT GREAT TOE AND SECOND TOE;  Surgeon: Franky MachoMark Jenkins Md, MD;  Location: [facility];  Service: General;  Laterality: Right;  right great and second toes  . Amputation Right 09/20/2014    Procedure: RIGHT TRANSMETATARSAL AMPUTATION;  Surgeon: Nadara MustardMarcus Duda V, MD;  Location: [facility];  Service: Orthopedics;  Laterality: Right;   Family History  Problem Relation Age of Onset  . Diabetes Mother   . Depression Father   . Depression Brother   . Diabetes Other   . Depression Other    History  Substance Use Topics  . Smoking status: Current Every Day Smoker -- 0.50 packs/day for 35 years    Types: Cigarettes  . Smokeless tobacco: Not on file  . Alcohol Use: 1.2 oz/week    2 Cans of beer per week     Comment: once per month    Review of Systems  Constitutional: Negative for fever, chills and fatigue.  HENT: Negative for trouble swallowing.   Eyes: Negative for visual disturbance.  Respiratory: Negative for shortness of breath.    Cardiovascular: Negative for chest pain and palpitations.  Gastrointestinal: Negative for nausea, vomiting, abdominal pain and diarrhea.  Genitourinary: Negative for dysuria and difficulty urinating.  Musculoskeletal: Negative for arthralgias and neck pain.  Skin: Negative for color change.  Neurological: Negative for dizziness and weakness.  Psychiatric/Behavioral: Positive for suicidal ideas. Negative for dysphoric mood.      Allergies  Review of patient's allergies indicates no known allergies.  Home Medications   Prior to Admission medications   Medication Sig Start Date End Date Taking? Authorizing Provider  acetaminophen (TYLENOL) 325 MG tablet Take 2 tablets (650 mg total) by mouth every 6 (six) hours as needed for mild pain (or Fever >/= 101). 09/26/14   Alison MurrayAlma M Devine, MD  ARIPiprazole (ABILIFY) 5 MG tablet Take 1 tablet (5 mg total) by mouth 2 (two) times daily. 09/26/14   Alison MurrayAlma M Devine, MD  aspirin 81 MG chewable tablet Chew 1 tablet (81 mg total) by mouth daily. 09/26/14   Alison MurrayAlma M Devine, MD  benztropine (COGENTIN) 0.5 MG tablet Take 1 tablet (0.5 mg total) by mouth 2 (two) times daily. 09/26/14   Alison MurrayAlma M Devine, MD  gabapentin (NEURONTIN) 300 MG capsule Take 1 capsule (300 mg total) by mouth 3 (three) times daily. 09/26/14   Alison MurrayAlma M Devine, MD  HYDROcodone-acetaminophen (NORCO/VICODIN) 5-325 MG per tablet Take 1 tablet by mouth every  4 (four) hours as needed for moderate pain. 10/03/14   Azalia Bilis, MD  insulin aspart (NOVOLOG) 100 UNIT/ML injection Inject 5 Units into the skin 3 (three) times daily with meals. 09/26/14   Alison Murray, MD  insulin glargine (LANTUS) 100 UNIT/ML injection Inject 0.2 mLs (20 Units total) into the skin at bedtime. 09/26/14   Alison Murray, MD  Multiple Vitamin (MULTIVITAMIN WITH MINERALS) TABS tablet Take 1 tablet by mouth daily. Patient not taking: Reported on 09/15/2014 04/18/14   Thermon Leyland, NP  saccharomyces boulardii (FLORASTOR) 250 MG capsule Take 1  capsule (250 mg total) by mouth 2 (two) times daily. 09/26/14   Alison Murray, MD  senna-docusate (SENOKOT-S) 8.6-50 MG per tablet Take 1 tablet by mouth at bedtime as needed for mild constipation. 09/26/14   Alison Murray, MD  sertraline (ZOLOFT) 100 MG tablet Take 1 tablet (100 mg total) by mouth daily. 09/26/14   Alison Murray, MD   BP 158/98 mmHg  Pulse 69  Temp(Src) 98.7 F (37.1 C) (Oral)  Resp 16  SpO2 100% Physical Exam  Constitutional: He is oriented to person, place, and time. He appears well-developed and well-nourished. No distress.  HENT:  Head: Normocephalic and atraumatic.  Eyes: Conjunctivae and EOM are normal.  Neck: Normal range of motion.  Cardiovascular: Normal rate and regular rhythm.  Exam reveals no gallop and no friction rub.   No murmur heard. Pulmonary/Chest: Effort normal and breath sounds normal. He has no wheezes. He has no rales. He exhibits no tenderness.  Abdominal: Soft. He exhibits no distension. There is no tenderness. There is no rebound.  Musculoskeletal: Normal range of motion.  Neurological: He is alert and oriented to person, place, and time. Coordination normal.  Speech is goal-oriented. Moves limbs without ataxia.   Skin: Skin is warm and dry.  Psychiatric:  Flat affect.  Nursing note and vitals reviewed.   ED Course  Procedures (including critical care time) Labs Review Labs Reviewed  ACETAMINOPHEN LEVEL - Abnormal; Notable for the following:    Acetaminophen (Tylenol), Serum <10 (*)    All other components within normal limits  CBC - Abnormal; Notable for the following:    WBC 12.1 (*)    RBC 3.10 (*)    Hemoglobin 9.7 (*)    HCT 28.8 (*)    All other components within normal limits  COMPREHENSIVE METABOLIC PANEL - Abnormal; Notable for the following:    Sodium 134 (*)    Glucose, Bld 502 (*)    Creatinine, Ser 1.25 (*)    Calcium 8.5 (*)    Albumin 3.1 (*)    All other components within normal limits  ETHANOL  SALICYLATE LEVEL   URINE RAPID DRUG SCREEN (HOSP PERFORMED) NOT AT [facility]    Imaging Review No results found.   EKG Interpretation None      MDM   Final diagnoses:  Suicidal ideation    11:55 PM Labs unremarkable for acute changes. Patient will have TTS evaluation.     Emilia Beck, PA-C 10/04/14 [address], PA-C 10/10/14 1534  Mirian Mo, MD 10/11/14 1505

## 2014-10-03 NOTE — ED Notes (Signed)
Pt c/o right foot pain that has been going on for while.  Pt states that while he was here the past couple of days we were treating the pain but since he left yesterday the pain has gotten bad agin.  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

## 2014-10-03 NOTE — ED Notes (Signed)
Sitter arrived at bedside.

## 2014-10-03 NOTE — ED Provider Notes (Signed)
CSN: [id number]     Arrival date & time 10/03/14  1427 History   First MD Initiated Contact with Patient 10/03/14 1556     Chief Complaint  Patient presents with  . Foot Pain      HPI Patient presents to emergency department with ongoing discomfort and pain in his right foot.  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 Lajoyce Cornersuda.  Irregular was at the rehabilitation facility but left the rehabilitation facility because he stated the place making him depressed.  He now presents with ongoing discomfort and pain in his right foot.  He states he has no medications for pain at home.  He is not on antibiotics.  He denies fevers and chills.  No spreading redness.  He reports ongoing postoperative pain uncontrolled by ibuprofen and Tylenol.  He is currently homeless and he was seen in emergency department last night for similar symptoms.  Case management was consult at that time and was recommended that he follow[facility].    Past Medical History  Diagnosis Date  . Diabetes mellitus without complication   . Depression    Past Surgical History  Procedure Laterality Date  . Right arm    . Cholecystectomy    . Amputation toe Right 09/03/2014    Procedure: AMPUTATION RIGHT GREAT TOE AND SECOND TOE;  Surgeon: Franky MachoMark Jenkins Md, MD;  Location: [facility];  Service: General;  Laterality: Right;  right great and second toes  . Amputation Right 09/20/2014    Procedure: RIGHT TRANSMETATARSAL AMPUTATION;  Surgeon: Nadara MustardMarcus Duda V, MD;  Location: [facility];  Service: Orthopedics;  Laterality: Right;   Family History  Problem Relation Age of Onset  . Diabetes Mother   . Depression Father   . Depression Brother   . Diabetes Other   . Depression Other    History  Substance Use Topics  . Smoking status: Current Every Day Smoker -- 0.50 packs/day for 35 years    Types: Cigarettes  . Smokeless tobacco: Not on file  . Alcohol Use: 1.2 oz/week    2 Cans of beer per week      Comment: once per month    Review of Systems  All other systems reviewed and are negative.     Allergies  Review of patient's allergies indicates no known allergies.  Home Medications   Prior to Admission medications   Medication Sig Start Date End Date Taking? Authorizing Provider  acetaminophen (TYLENOL) 325 MG tablet Take 2 tablets (650 mg total) by mouth every 6 (six) hours as needed for mild pain (or Fever >/= 101). 09/26/14   Alison MurrayAlma M Devine, MD  ARIPiprazole (ABILIFY) 5 MG tablet Take 1 tablet (5 mg total) by mouth 2 (two) times daily. 09/26/14   Alison MurrayAlma M Devine, MD  aspirin 81 MG chewable tablet Chew 1 tablet (81 mg total) by mouth daily. 09/26/14   Alison MurrayAlma M Devine, MD  benztropine (COGENTIN) 0.5 MG tablet Take 1 tablet (0.5 mg total) by mouth 2 (two) times daily. 09/26/14   Alison MurrayAlma M Devine, MD  gabapentin (NEURONTIN) 300 MG capsule Take 1 capsule (300 mg total) by mouth 3 (three) times daily. 09/26/14   Alison MurrayAlma M Devine, MD  insulin aspart (NOVOLOG) 100 UNIT/ML injection Inject 5 Units into the skin 3 (three) times daily with meals. 09/26/14   Alison MurrayAlma M Devine, MD  insulin glargine (LANTUS) 100 UNIT/ML injection Inject 0.2 mLs (20 Units total) into the skin at  bedtime. 09/26/14   Alison Murray, MD  Multiple Vitamin (MULTIVITAMIN WITH MINERALS) TABS tablet Take 1 tablet by mouth daily. Patient not taking: Reported on 09/15/2014 04/18/14   Thermon Leyland, NP  saccharomyces boulardii (FLORASTOR) 250 MG capsule Take 1 capsule (250 mg total) by mouth 2 (two) times daily. 09/26/14   Alison Murray, MD  senna-docusate (SENOKOT-S) 8.6-50 MG per tablet Take 1 tablet by mouth at bedtime as needed for mild constipation. 09/26/14   Alison Murray, MD  sertraline (ZOLOFT) 100 MG tablet Take 1 tablet (100 mg total) by mouth daily. 09/26/14   Alison Murray, MD   BP 165/84 mmHg  Pulse 71  Temp(Src) 98.3 F (36.8 C) (Oral)  Resp 18  SpO2 100% Physical Exam  Constitutional: He is oriented to person, place, and time. He  appears well-developed and well-nourished.  HENT:  Head: Normocephalic.  Eyes: EOM are normal.  Neck: Normal range of motion.  Pulmonary/Chest: Effort normal.  Abdominal: He exhibits no distension.  Musculoskeletal: Normal range of motion.  Right transmetarsal sutures in place. No drainage or erythema or warmth. No fluctuance  Neurological: He is alert and oriented to person, place, and time.  Psychiatric: He has a normal mood and affect.  Nursing note and vitals reviewed.   ED Course  Procedures (including critical care time) Labs Review Labs Reviewed - No data to display  Imaging Review No results found.   EKG Interpretation None      MDM   Final diagnoses:  Post-operative pain  Right foot pain    Vital signs are normal.  Sutures are in place.  No obvious infection.  I've asked that the patient call his orthopedic surgeon tomorrow morning for follow-up in the next several days.  Home with a short course of pain medication.    Azalia Bilis, MD 10/03/14 [phone]

## 2014-10-03 NOTE — ED Notes (Signed)
Pt states he is suicidal with a plan to cut wrist while here at hospital.  Pt had a razor blade in his pocket that was placed in a red biohazard bag and taken out of room.  Denies auditory and visual hallucinations.  States he has been seen before for same and that no one believes him.  States he is homeless, hungry, and can no longer sleep in his truck.

## 2014-10-04 DIAGNOSIS — R45851 Suicidal ideations: Secondary | ICD-10-CM

## 2014-10-04 DIAGNOSIS — F329 Major depressive disorder, single episode, unspecified: Secondary | ICD-10-CM | POA: Diagnosis not present

## 2014-10-04 LAB — CBG MONITORING, ED
GLUCOSE-CAPILLARY: 384 mg/dL — AB (ref 65–99)
GLUCOSE-CAPILLARY: 271 mg/dL — AB (ref 65–99)
GLUCOSE-CAPILLARY: 275 mg/dL — AB (ref 65–99)
GLUCOSE-CAPILLARY: 176 mg/dL — AB (ref 65–99)
GLUCOSE-CAPILLARY: 429 mg/dL — AB (ref 65–99)
Glucose-Capillary: 183 mg/dL — ABNORMAL HIGH (ref 65–99)
Glucose-Capillary: 320 mg/dL — ABNORMAL HIGH (ref 65–99)
Glucose-Capillary: 352 mg/dL — ABNORMAL HIGH (ref 65–99)

## 2014-10-04 MED ORDER — INSULIN ASPART 100 UNIT/ML ~~LOC~~ SOLN
5.0000 [IU] | Freq: Once | SUBCUTANEOUS | Status: AC
  Administered 2014-10-04: 5 [IU] via SUBCUTANEOUS
  Filled 2014-10-04: qty 1

## 2014-10-04 MED ORDER — ACETAMINOPHEN 325 MG PO TABS: 650.0000 mg | ORAL_TABLET | ORAL | Status: DC | PRN

## 2014-10-04 MED ORDER — TRAZODONE HCL 100 MG PO TABS
100.0000 mg | ORAL_TABLET | Freq: Every evening | ORAL | Status: DC | PRN
  Administered 2014-10-04: 100 mg via ORAL
  Filled 2014-10-04: qty 1

## 2014-10-04 MED ORDER — INSULIN ASPART 100 UNIT/ML ~~LOC~~ SOLN
0.0000 [IU] | Freq: Three times a day (TID) | SUBCUTANEOUS | Status: DC
  Administered 2014-10-04: 11 [IU] via SUBCUTANEOUS
  Administered 2014-10-04: 15 [IU] via SUBCUTANEOUS
  Administered 2014-10-04: 8 [IU] via SUBCUTANEOUS
  Administered 2014-10-05: 3 [IU] via SUBCUTANEOUS
  Administered 2014-10-05: 15 [IU] via SUBCUTANEOUS
  Administered 2014-10-05 – 2014-10-06 (×2): 3 [IU] via SUBCUTANEOUS
  Filled 2014-10-04 (×7): qty 1

## 2014-10-04 MED ORDER — IBUPROFEN 400 MG PO TABS: 600.0000 mg | ORAL_TABLET | Freq: Three times a day (TID) | ORAL | Status: DC | PRN

## 2014-10-04 NOTE — Progress Notes (Signed)
LCSW completed VSPADT for partnership to end homelessness. Patient very cooperative and agreeable. Was made aware this is not a fast process, however would begin the process of case management and assist with stable housing.  Patient aware of [facility] and encouraged to follow up during the day in effort for Partnership to find patient and complete disability claim for Medicaid. Patient agreeable.  Deretha Emory, MSW Clinical Social Work: Emergency Room [phone]

## 2014-10-04 NOTE — ED Notes (Signed)
While taking vitals pt expressed feelings of hopelessness as well as thoughts of harming self. Pt stated he had no current plan but if he was not in the hospital he would try to harm himself. Pt states that when he is laying down in bed he sees his wife who recently passed away. Pt has experiences 6 recent losses of family and friends in the past few months. Plan discussed with pt about getting CBG under control and then being moved to [facility].

## 2014-10-04 NOTE — ED Notes (Signed)
Turkey Sandwich and water provided 

## 2014-10-04 NOTE — ED Notes (Signed)
Pt denies want for pain medication. Pt aware PRN medication ordered.

## 2014-10-04 NOTE — ED Notes (Signed)
Pt has continued to deny any pain medication. Pt aware that he has PRN advil or tylenol ordered.

## 2014-10-04 NOTE — ED Notes (Signed)
Megan at [facility] made aware of CBG decrease. [facility] states pt needs to be re-evaluated. Awaiting phone call.

## 2014-10-04 NOTE — ED Notes (Signed)
PA notified on pt.'s elevated BP , no new order given.

## 2014-10-04 NOTE — Progress Notes (Addendum)
LCSW spoke with Psych MD Dr. Shela Commons. Patient has been seen, not SI/HI/AVH, but due to patient being chronically depressed and needing placement, Psych MD has reported patient has expressed wanting to go to another placement and upset at himself for sabotaging his SNF placement at [facility]. At this time, SNF is not an option as patient left AMA and physically not needing that level of care. LCSW discussed plan to complete VSPADT on patient, and called Tonya with [facility]eless[facility] who agrees patient would be a good candidate.  LCSW to complete with patient prior to DC. TTS Khristine Verno is assisting CSW at [facility] with shelters/beds available: [facility]- No beds available currently. "If bed is still needed tomorrow (10/05/14)  morning, call back to check status" [facility]- Left voicemail.  [facility]- Left voicemail. [facility]- At capacity currently. "When there is availability, and intake screening appt should be scheduled by calling [phone]"  Deretha Emory, MSW Clinical Social Work: Emergency Room [phone]  Ilean Skill, MSW, [facility] Clinical Social Work, Disposition  10/04/2014 [phone]

## 2014-10-04 NOTE — ED Notes (Signed)
Patient CBG was 275, it was reported to Nurse at 807.

## 2014-10-04 NOTE — ED Notes (Signed)
CBG 271. Nurse notified.

## 2014-10-04 NOTE — Consult Note (Signed)
[facility] Consult   Reason for Consult:  Depression and suicide ideation Referring Physician:  ERP Patient Identification: Tyler Burgess MRN:  [id number] Principal Diagnosis: Suicidal ideation Diagnosis:   Patient Active Problem List   Diagnosis Date Noted  . Depression [F32.9]   . Diabetic ketoacidosis without coma associated with type 2 diabetes mellitus [E13.10]   . Weakness [R53.1]   . DKA (diabetic ketoacidoses) [E13.10] 09/15/2014  . Hyponatremia [E87.1] 09/15/2014  . DKA, type 2, not at goal [E13.10] 09/15/2014  . Suicidal ideations [R45.851]   . Diarrhea [R19.7]   . Enteritis due to Clostridium difficile [A04.7] 09/05/2014  . Osteomyelitis [M86.9] 08/31/2014  . Sepsis [A41.9] 08/31/2014  . Substance abuse [F19.10]   . Suicidal ideation [R45.851]   . Type 2 diabetes mellitus with hyperglycemia [E11.65] 04/15/2014  . Suicidal behavior [F48.9] 04/11/2014  . 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 GAD (generalized anxiety disorder) [F41.1] 04/11/2014  . Alcohol use disorder, severe, dependence [F10.20] 04/11/2014  . Opioid use disorder, moderate, in sustained remission [F11.90] 04/11/2014  . Opioid dependence in remission [F11.21] 01/10/2014  . Severe major depression without psychotic features [F32.2] 01/09/2014  . 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 Type 2 diabetes mellitus with peripheral neuropathy [E11.40] 12/11/2013  . 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 male patient admitted with depression and suicide ideation.  HPI: Tyler Burgess is an 59 y.o. seen face-to-face psychiatric consultation and evaluation the [facility] emergency department. Patient reported he need help from the social service to find appropriate place to live and medication to control his both medical and emotional problems.  Patient reported he has been feeling really tired by living in his truck around [employer] without room. Patient reported he made a big mistake by signing out himself from [facility] in [place] where he was received physical therapy, occupational therapy, food and shelter and has people to talk to him. Patient reported after being there for 1 week he felt depressed is seeing people who were not active around him. Patient reported his brother helped him to come out of there, since then he has been trying to get into the emergency department to get services from the social service. Patient was sent home twice before this visit. Patient initially reported foot pain when he was operated recently and [facility] long hospital. When patient was ready to be discharged and medically he started complaining about depression and suicidal ideation and plans. Patient told me and also staff RN this morning he is not going to kill himself is looking for place to live and be been tired of living in his truck with his disabled foot. Patient has a history of bipolar depression and previously treated in [place]. The shunt was also placed in prison for threatening and psychiatric clinic for not providing scheduled visit. Patient has a brother and sister who are supportive to him but cannot provide any monitory assistance for him. Patient has a history of alcohol dependence but currently been sober.   HPI Elements:   Location:  Depression. Quality:  Fair. Severity:  Mild-to-moderate. Timing:  Lack of psychosocial resources. Duration:  1-2 weeks. Context:  Psychosocial stresses, no finances, no job no medication and making a poor decision making of signing himself or off of the facility placed by the hospital less than 2 weeks ago.  Past Medical History:  Past Medical History  Diagnosis Date  . Diabetes mellitus without complication   . Depression     Past Surgical History  Procedure Laterality Date  . Right  arm    . Cholecystectomy    . Amputation toe Right 09/03/2014    Procedure: AMPUTATION RIGHT GREAT TOE AND SECOND TOE;  Surgeon: Aviva Signs Md, MD;  Location: [facility];  Service: General;  Laterality: Right;  right great and second toes  . Amputation Right 09/20/2014    Procedure: RIGHT TRANSMETATARSAL AMPUTATION;  Surgeon: Newt Minion, MD;  Location: [facility];  Service: Orthopedics;  Laterality: Right;   Family History:  Family History  Problem Relation Age of Onset  . Diabetes Mother   . Depression Father   . Depression Brother   . Diabetes Other   . Depression Other    Social History:  History  Alcohol Use  . 1.2 oz/week  . 2 Cans of beer per week    Comment: once per month     History  Drug Use No    History   Social History  . Marital Status: Widowed    Spouse Name: N/A  . Number of Children: N/A  . Years of Education: N/A   Social History Main Topics  . Smoking status: Current Every Day Smoker -- 0.50 packs/day for 35 years    Types: Cigarettes  . Smokeless tobacco: Not on file  . Alcohol Use: 1.2 oz/week    2 Cans of beer per week     Comment: once per month  . Drug Use: No  . Sexual Activity: No   Other Topics Concern  . None   Social History Narrative   Homeless and supposed to go to a Shelter in [place].  [facility] could not get medications right and so he took a loaded pistol into the center and had to be jailed for 9 days.  Gets around in a truck.  Lives in his truck and occasionally visits his brother.     Additional Social History:    Prescriptions: See PTA list History of alcohol / drug use?: Yes Longest period of sobriety (when/how long): 10 years Negative Consequences of Use: Financial, Scientist, research (physical sciences), Personal relationships, Work / School Name of Substance 1: Alcohol 1 - Age of First Use: 17 1 - Amount (size/oz): 1 pint now, 2 beers 1 - Frequency: nightly now, weekly 1 - Duration: years 1 - Last Use / Amount: 15 yrs ago stopped drinking  heavily Name of Substance 2: Nicotine 2 - Age of First Use: 17 2 - Amount (size/oz): 1/2 pack 2 - Frequency: daily 2 - Duration: years 2 - Last Use / Amount: today                 Allergies:  No Known Allergies  Labs:  Results for orders placed or performed during the hospital encounter of 10/03/14 (from the past 48 hour(s))  Urine rapid drug screen (hosp performed)not at [facility]     Status: None   Collection Time: 10/03/14  8:12 PM  Result Value Ref Range   Opiates NONE DETECTED NONE DETECTED   Cocaine NONE DETECTED NONE DETECTED   Benzodiazepines NONE DETECTED NONE DETECTED   Amphetamines NONE DETECTED NONE DETECTED   Tetrahydrocannabinol NONE DETECTED NONE DETECTED   Barbiturates NONE DETECTED NONE DETECTED    Comment:        DRUG SCREEN [place].  IF CONFIRMATION IS NEEDED FOR ANY PURPOSE, NOTIFY LAB  WITHIN 5 DAYS.        LOWEST DETECTABLE LIMITS FOR URINE DRUG SCREEN Drug Class       Cutoff (ng/mL) Amphetamine      1000 Barbiturate      200 Benzodiazepine   376 Tricyclics       283 Opiates          300 Cocaine          300 THC              50   Acetaminophen level     Status: Abnormal   Collection Time: 10/03/14 10:10 PM  Result Value Ref Range   Acetaminophen (Tylenol), Serum <10 (L) 10 - 30 ug/mL    Comment:        THERAPEUTIC CONCENTRATIONS VARY SIGNIFICANTLY. A RANGE OF 10-30 ug/mL MAY BE AN EFFECTIVE CONCENTRATION FOR MANY PATIENTS. HOWEVER, SOME ARE BEST TREATED AT CONCENTRATIONS OUTSIDE THIS RANGE. ACETAMINOPHEN CONCENTRATIONS >150 ug/mL AT 4 HOURS AFTER INGESTION AND >50 ug/mL AT 12 HOURS AFTER INGESTION ARE OFTEN ASSOCIATED WITH TOXIC REACTIONS.   CBC     Status: Abnormal   Collection Time: 10/03/14 10:10 PM  Result Value Ref Range   WBC 12.1 (H) 4.0 - 10.5 K/uL   RBC 3.10 (L) 4.22 - 5.81 MIL/uL   Hemoglobin 9.7 (L) 13.0 - 17.0 g/dL   HCT 28.8 (L) 39.0 - 52.0 %   MCV 92.9 78.0 - 100.0 fL   MCH 31.3 26.0 - 34.0 pg    MCHC 33.7 30.0 - 36.0 g/dL   RDW 13.8 11.5 - 15.5 %   Platelets 324 150 - 400 K/uL  Comprehensive metabolic panel     Status: Abnormal   Collection Time: 10/03/14 10:10 PM  Result Value Ref Range   Sodium 134 (L) 135 - 145 mmol/L   Potassium 4.2 3.5 - 5.1 mmol/L   Chloride 101 101 - 111 mmol/L   CO2 24 22 - 32 mmol/L   Glucose, Bld 502 (H) 65 - 99 mg/dL   BUN 17 6 - 20 mg/dL   Creatinine, Ser 1.25 (H) 0.61 - 1.24 mg/dL   Calcium 8.5 (L) 8.9 - 10.3 mg/dL   Total Protein 7.0 6.5 - 8.1 g/dL   Albumin 3.1 (L) 3.5 - 5.0 g/dL   AST 37 15 - 41 U/L   ALT 44 17 - 63 U/L   Alkaline Phosphatase 122 38 - 126 U/L   Total Bilirubin 0.3 0.3 - 1.2 mg/dL   GFR calc non Af Amer >60 >60 mL/min   GFR calc Af Amer >60 >60 mL/min    Comment: (NOTE) The eGFR has been calculated using the CKD EPI equation. This calculation has not been validated in all clinical situations. eGFR's persistently <60 mL/min signify possible Chronic Kidney Disease.    Anion gap 9 5 - 15  Ethanol (ETOH)     Status: None   Collection Time: 10/03/14 10:10 PM  Result Value Ref Range   Alcohol, Ethyl (B) <5 <5 mg/dL    Comment:        LOWEST DETECTABLE LIMIT FOR SERUM ALCOHOL IS 5 mg/dL FOR MEDICAL PURPOSES ONLY   Salicylate level     Status: None   Collection Time: 10/03/14 10:10 PM  Result Value Ref Range   Salicylate Lvl <1.5 2.8 - 30.0 mg/dL  POC CBG, ED     Status: Abnormal   Collection Time: 10/04/14  1:37 AM  Result Value Ref Range   Glucose-Capillary 384 (H)  65 - 99 mg/dL  CBG monitoring, ED     Status: Abnormal   Collection Time: 10/04/14  5:59 AM  Result Value Ref Range   Glucose-Capillary 271 (H) 65 - 99 mg/dL  CBG monitoring, ED     Status: Abnormal   Collection Time: 10/04/14  8:06 AM  Result Value Ref Range   Glucose-Capillary 275 (H) 65 - 99 mg/dL   Comment 1 Notify RN    Comment 2 Document in Chart   CBG monitoring, ED     Status: Abnormal   Collection Time: 10/04/14  9:56 AM  Result Value Ref  Range   Glucose-Capillary 183 (H) 65 - 99 mg/dL    Vitals: Blood pressure 159/79, pulse 73, temperature 98.4 F (36.9 C), temperature source Oral, resp. rate 16, SpO2 100 %.  Risk to Self: Suicidal Ideation: Yes-Currently Present Suicidal Intent: Yes-Currently Present Is patient at risk for suicide?: Yes Suicidal Plan?: Yes-Currently Present Specify Current Suicidal Plan: planned to cut down his arm to bleed until dead Access to Means: Yes Specify Access to Suicidal Means: razor blade What has been your use of drugs/alcohol within the last 12 months?: heavy use before, now weeklyuse How many times?: 5 Other Self Harm Risks: no Triggers for Past Attempts: Hallucinations, Unpredictable Intentional Self Injurious Behavior: None Comment - Self Injurious Behavior: na Risk to Others: Homicidal Ideation: No (denies) Thoughts of Harm to Others: No (denies) Comment - Thoughts of Harm to Others: na Current Homicidal Intent: No Current Homicidal Plan: No Describe Current Homicidal Plan: na Access to Homicidal Means: No Identified Victim: na History of harm to others?: Yes Assessment of Violence: In past 6-12 months Violent Behavior Description: took BB gun to threaten doctor to change his meds Does patient have access to weapons?: No (denies) Criminal Charges Pending?: No Does patient have a court date: No Prior Inpatient Therapy: Prior Inpatient Therapy: Yes Prior Therapy Dates: Dec 2015 Prior Therapy Facilty/Provider(s): [facility] Reason for Treatment: HI Prior Outpatient Therapy: Prior Outpatient Therapy: Yes Prior Therapy Dates: 2015 Prior Therapy Facilty/Provider(s): [facility] Reason for Treatment: med mgmt Does patient have an ACCT team?: No Does patient have Intensive In-House Services?  : No Does patient have [facility] services? : No Does patient have P4CC services?: No  Current Facility-Administered Medications  Medication Dose Route Frequency Provider Last  Rate Last Dose  . acetaminophen (TYLENOL) tablet 650 mg  650 mg Oral Q4H PRN Alvina Chou, PA-C      . ibuprofen (ADVIL,MOTRIN) tablet 600 mg  600 mg Oral Q8H PRN Kaitlyn Szekalski, PA-C      . insulin aspart (novoLOG) injection 0-15 Units  0-15 Units Subcutaneous TID WC Alfonzo Beers, MD   8 Units at 10/04/14 [id number]   Current Outpatient Prescriptions  Medication Sig Dispense Refill  . insulin glargine (LANTUS) 100 UNIT/ML injection Inject 0.2 mLs (20 Units total) into the skin at bedtime. 10 mL 11  . sertraline (ZOLOFT) 100 MG tablet Take 1 tablet (100 mg total) by mouth daily. 30 tablet 0  . acetaminophen (TYLENOL) 325 MG tablet Take 2 tablets (650 mg total) by mouth every 6 (six) hours as needed for mild pain (or Fever >/= 101). (Patient not taking: Reported on 10/04/2014) 30 tablet 0  . ARIPiprazole (ABILIFY) 5 MG tablet Take 1 tablet (5 mg total) by mouth 2 (two) times daily. (Patient not taking: Reported on 10/04/2014) 60 tablet 0  . aspirin 81 MG chewable tablet Chew 1 tablet (81 mg total) by mouth  daily. (Patient not taking: Reported on 10/04/2014) 30 tablet 0  . benztropine (COGENTIN) 0.5 MG tablet Take 1 tablet (0.5 mg total) by mouth 2 (two) times daily. (Patient not taking: Reported on 10/04/2014) 60 tablet 0  . gabapentin (NEURONTIN) 300 MG capsule Take 1 capsule (300 mg total) by mouth 3 (three) times daily. (Patient not taking: Reported on 10/04/2014) 90 capsule 0  . HYDROcodone-acetaminophen (NORCO/VICODIN) 5-325 MG per tablet Take 1 tablet by mouth every 4 (four) hours as needed for moderate pain. (Patient not taking: Reported on 10/04/2014) 8 tablet 0  . insulin aspart (NOVOLOG) 100 UNIT/ML injection Inject 5 Units into the skin 3 (three) times daily with meals. (Patient not taking: Reported on 10/04/2014) 10 mL 11  . Multiple Vitamin (MULTIVITAMIN WITH MINERALS) TABS tablet Take 1 tablet by mouth daily. (Patient not taking: Reported on 09/15/2014)    . saccharomyces boulardii (FLORASTOR) 250  MG capsule Take 1 capsule (250 mg total) by mouth 2 (two) times daily. (Patient not taking: Reported on 10/04/2014) 60 capsule 0  . senna-docusate (SENOKOT-S) 8.6-50 MG per tablet Take 1 tablet by mouth at bedtime as needed for mild constipation. (Patient not taking: Reported on 10/04/2014) 10 tablet 0    Musculoskeletal: Strength & Muscle Tone: decreased Gait & Station: unable to stand Patient leans: N/A  Psychiatric Specialty Exam: Physical Exam Full physical performed in Emergency Department. I have reviewed this assessment and concur with its findings.   ROS complaining about generalized weakness, tiredness, poor sleep, depression and anxiety. No Fever-chills, No Headache, No changes with Vision or hearing, reports vertigo No problems swallowing food or Liquids, No Chest pain, Cough or Shortness of Breath, No Abdominal pain, No Nausea or Vommitting, Bowel movements are regular, No Blood in stool or Urine, No dysuria, No new skin rashes or bruises, No new joints pains-aches,  No new weakness, tingling, numbness in any extremity, No recent weight gain or loss, No polyuria, polydypsia or polyphagia,   A full 10 point Review of Systems was done, except as stated above, all other Review of Systems were negative.  Blood pressure 159/79, pulse 73, temperature 98.4 F (36.9 C), temperature source Oral, resp. rate 16, SpO2 100 %.There is no weight on file to calculate BMI.  General Appearance: Disheveled and Guarded  Eye Contact::  Good  Speech:  Clear and Coherent and Slow  Volume:  Decreased  Mood:  Anxious and Depressed  Affect:  Appropriate and Congruent  Thought Process:  Coherent and Goal Directed  Orientation:  Full (Time, Place, and Person)  Thought Content:  WDL  Suicidal Thoughts:  No  Homicidal Thoughts:  No  Memory:  Immediate;   Fair Recent;   Fair  Judgement:  Intact  Insight:  Good  Psychomotor Activity:  Decreased  Concentration:  Good  Recall:  Good  Fund of  Knowledge:Good  Language: Good  Akathisia:  Negative  Handed:  Right  AIMS (if indicated):     Assets:  Communication Skills Desire for Improvement Leisure Time Resilience Social Support  ADL's:  Intact  Cognition: WNL  Sleep:      Medical Decision Making: Review of Psycho-Social Stressors (1), Review or order clinical lab tests (1), Established Problem, Worsening (2), Review of Last Therapy Session (1), Review or order medicine tests (1), Review of Medication Regimen & Side Effects (2) and Review of New Medication or Change in Dosage (2)  Treatment Plan Summary: Patient presented with multiple psychosocial stressors, limited to psychosocial resources, poor decision making  and history of mental illness and substance abuse. Patient contract for safety and seeks psychosocial support from the emergency department at this time. Reportedly this is a third attempt to get into the emergency department without success finding a place to live and medication to take. Daily contact with patient to assess and evaluate symptoms and progress in treatment and Medication management  Plan: Suicidal: Patient contract for safety does not required safety monitoring  Spoke with the ER social service regarding patient request.  Recommend to resume his home medication management Refer to the social service for appropriate psychosocial support regarding homelessness and medication Patient does not meet criteria for psychiatric inpatient admission. Supportive therapy provided about ongoing stressors. Appreciate psychiatric consultation and will sign off  Please contact [phone] or [phone] if needs further assistance   Disposition: Patient will be referred to this social service department for appropriate psychosocial support patient needed at this time as he was disabled with her disability assistance, homeless and living in a truck without gas in it.  Haadiya Frogge,JANARDHAHA R. 10/04/2014 11:04 AM
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
## 2014-10-04 NOTE — ED Notes (Signed)
Teleconference with counselor in progress at pt.'s room .

## 2014-10-05 LAB — CBG MONITORING, ED
GLUCOSE-CAPILLARY: 196 mg/dL — AB (ref 65–99)
GLUCOSE-CAPILLARY: 177 mg/dL — AB (ref 65–99)
Glucose-Capillary: 421 mg/dL — ABNORMAL HIGH (ref 65–99)
Glucose-Capillary: 238 mg/dL — ABNORMAL HIGH (ref 65–99)
Glucose-Capillary: 191 mg/dL — ABNORMAL HIGH (ref 65–99)
Glucose-Capillary: 243 mg/dL — ABNORMAL HIGH (ref 65–99)

## 2014-10-05 MED ORDER — BENZTROPINE MESYLATE 1 MG PO TABS
0.5000 mg | ORAL_TABLET | Freq: Two times a day (BID) | ORAL | Status: DC
  Administered 2014-10-05 (×2): 0.5 mg via ORAL
  Filled 2014-10-05 (×2): qty 1

## 2014-10-05 MED ORDER — SERTRALINE HCL 50 MG PO TABS
100.0000 mg | ORAL_TABLET | Freq: Every day | ORAL | Status: DC
  Administered 2014-10-05: 100 mg via ORAL
  Filled 2014-10-05: qty 2

## 2014-10-05 MED ORDER — GABAPENTIN 300 MG PO CAPS
300.0000 mg | ORAL_CAPSULE | Freq: Three times a day (TID) | ORAL | Status: DC
  Administered 2014-10-05 (×2): 300 mg via ORAL
  Filled 2014-10-05 (×3): qty 1

## 2014-10-05 MED ORDER — ARIPIPRAZOLE 10 MG PO TABS
5.0000 mg | ORAL_TABLET | Freq: Two times a day (BID) | ORAL | Status: DC
  Administered 2014-10-05 (×2): 5 mg via ORAL
  Filled 2014-10-05 (×2): qty 1

## 2014-10-05 MED ORDER — ASPIRIN 81 MG PO CHEW
81.0000 mg | CHEWABLE_TABLET | Freq: Every day | ORAL | Status: DC
  Administered 2014-10-05: 81 mg via ORAL
  Filled 2014-10-05: qty 1

## 2014-10-05 MED ORDER — INSULIN GLARGINE 100 UNIT/ML ~~LOC~~ SOLN
20.0000 [IU] | Freq: Every day | SUBCUTANEOUS | Status: DC
  Administered 2014-10-05: 20 [IU] via SUBCUTANEOUS
  Filled 2014-10-05 (×3): qty 0.2

## 2014-10-05 MED ORDER — INSULIN ASPART 100 UNIT/ML ~~LOC~~ SOLN
5.0000 [IU] | Freq: Three times a day (TID) | SUBCUTANEOUS | Status: DC
  Administered 2014-10-05 – 2014-10-06 (×2): 5 [IU] via SUBCUTANEOUS
  Filled 2014-10-05 (×2): qty 1

## 2014-10-05 MED ORDER — LOPERAMIDE HCL 2 MG PO CAPS
2.0000 mg | ORAL_CAPSULE | ORAL | Status: DC | PRN
  Administered 2014-10-05: 2 mg via ORAL
  Filled 2014-10-05: qty 1

## 2014-10-05 NOTE — ED Notes (Signed)
[id number]  

## 2014-10-05 NOTE — ED Notes (Signed)
Pt walking around station with his walker and sitter at side.

## 2014-10-05 NOTE — ED Notes (Signed)
Pts CBG is 196. Nurse notified.

## 2014-10-05 NOTE — ED Notes (Signed)
Pt resting; easily arousable; took medications without difficulty. Sitter at bedside

## 2014-10-05 NOTE — ED Provider Notes (Signed)
12:29 PM blood sugar elevated over 400, will receive sliding scale.  In reviewing patient's chart his home meds, including evening lantus had not been ordered.  These have been ordered now- will continue with sliding scale today and pt will get his lantus tonight.   Jerelyn Scott, MD 10/05/14 1230

## 2014-10-05 NOTE — ED Notes (Signed)
Pt had diarrhea and did not make it to restroom in time. Pt soiled his pants and the floor. Given new pants.

## 2014-10-05 NOTE — ED Notes (Signed)
Pt given decaf coffee and sandwich for snacks

## 2014-10-05 NOTE — ED Notes (Addendum)
Pt ambulated to the bathroom and is walking around the unit with walker and sitter.

## 2014-10-05 NOTE — Progress Notes (Signed)
Patient Disposition:    Patient remains to have unstable glucose levels.  Patient to continue to be monitored until medically stable for DC. Patient has contracted for safety and is cleared from psych perspective as he has denied SI/HI/AVH.  He has been walking with staff in the hallways and had a pleasant/open affect.  Patient per MD Psych note is being referred to SW in ED for placement. Placement is NOT an option for ALF, SNF, or group home for patient as patient was placed just recently and left AMA on a letter of guarantee from the hospital.  Hospital will not support another LOG for patient due to leaving AMA.  Patient aware of resources in the community and efforts have been made to refer him to [facility] however there have not been any beds. Patient encouraged to call on Saturday morning, (earlier the better) in effort to obtain a bed. Patient also given information for Medicaid Disability now that he is a new amputee and application to apply. Patient has resources for outpatient mental health Patient also completed assessment for homelessness/CM in effort to obtain housing as he has been living in his truck. Assessment faxed/scanned in e-mail and sent.    Patient is not for inpatient psych placement. Once stabilized he will need to DC, hopeful to a shelter.  Deretha Emory, MSW Clinical Social Work: Emergency Room [phone]

## 2014-10-05 NOTE — ED Notes (Signed)
Pt pacing around room with steady gait; denies needs at this time time. Sitter at bedside

## 2014-10-05 NOTE — ED Notes (Addendum)
Pt ambulated to the restroom.

## 2014-10-05 NOTE — ED Notes (Signed)
Me[employer] delivered patient eating breakfast.

## 2014-10-05 NOTE — ED Notes (Signed)
Patient ambulating with walker

## 2014-10-06 LAB — CBG MONITORING, ED: Glucose-Capillary: 162 mg/dL — ABNORMAL HIGH (ref 65–99)

## 2014-10-06 NOTE — ED Notes (Signed)
Attempted to encourage pt to wait for CM to assist w/meds. Pt states he wants to leave and will get help w/them from [facility]. Dahlia Client, SW, aware pt refusing to wait.

## 2014-10-06 NOTE — Discharge Instructions (Signed)
It was our pleasure to provide your ER care today - we hope that you feel better.  Drink adequate water. Continue your diabetic medication.  Eat diabetic diet.    Follow up with primary care doctor in the next few days.  For mental health issues, follow up with [facility], and use resource guide provided.  Return to ER if worse, new symptoms, fevers, persistent vomiting, severe depression, medical emergency, other concern.     Depression Depression refers to feeling sad, low, down in the dumps, blue, gloomy, or empty. In general, there are two kinds of depression:  Normal sadness or normal grief. This kind of depression is one that we all feel from time to time after upsetting life experiences, such as the loss of a job or the ending of a relationship. This kind of depression is considered normal, is short lived, and resolves within a few days to 2 weeks. Depression experienced after the loss of a loved one (bereavement) often lasts longer than 2 weeks but normally gets better with time.  Clinical depression. This kind of depression lasts longer than normal sadness or normal grief or interferes with your ability to function at home, at work, and in school. It also interferes with your personal relationships. It affects almost every aspect of your life. Clinical depression is an illness. Symptoms of depression can also be caused by conditions other than those mentioned above, such as:  Physical illness. Some physical illnesses, including underactive thyroid gland (hypothyroidism), severe anemia, specific types of cancer, diabetes, uncontrolled seizures, heart and lung problems, strokes, and chronic pain are commonly associated with symptoms of depression.  Side effects of some prescription medicine. In some people, certain types of medicine can cause symptoms of depression.  Substance abuse. Abuse of alcohol and illicit drugs can cause symptoms of depression. SYMPTOMS Symptoms of normal  sadness and normal grief include the following:  Feeling sad or crying for short periods of time.  Not caring about anything (apathy).  Difficulty sleeping or sleeping too much.  No longer able to enjoy the things you used to enjoy.  Desire to be by oneself all the time (social isolation).  Lack of energy or motivation.  Difficulty concentrating or remembering.  Change in appetite or weight.  Restlessness or agitation. Symptoms of clinical depression include the same symptoms of normal sadness or normal grief and also the following symptoms:  Feeling sad or crying all the time.  Feelings of guilt or worthlessness.  Feelings of hopelessness or helplessness.  Thoughts of suicide or the desire to harm yourself (suicidal ideation).  Loss of touch with reality (psychotic symptoms). Seeing or hearing things that are not real (hallucinations) or having false beliefs about your life or the people around you (delusions and paranoia). DIAGNOSIS  The diagnosis of clinical depression is usually based on how bad the symptoms are and how long they have lasted. Your health care provider will also ask you questions about your medical history and substance use to find out if physical illness, use of prescription medicine, or substance abuse is causing your depression. Your health care provider may also order blood tests. TREATMENT  Often, normal sadness and normal grief do not require treatment. However, sometimes antidepressant medicine is given for bereavement to ease the depressive symptoms until they resolve. The treatment for clinical depression depends on how bad the symptoms are but often includes antidepressant medicine, counseling with a mental health professional, or both. Your health care provider will help to determine what  treatment is best for you. Depression caused by physical illness usually goes away with appropriate medical treatment of the illness. If prescription medicine is  causing depression, talk with your health care provider about stopping the medicine, decreasing the dose, or changing to another medicine. Depression caused by the abuse of alcohol or illicit drugs goes away when you stop using these substances. Some adults need professional help in order to stop drinking or using drugs. SEEK IMMEDIATE MEDICAL CARE IF:  You have thoughts about hurting yourself or others.  You lose touch with reality (have psychotic symptoms).  You are taking medicine for depression and have a serious side effect. FOR MORE INFORMATION  National [employer] on Mental Illness: www.nami.[employer]: https://carter.com/ Document Released: 04/10/2000 Document Revised: 08/28/2013 Document Reviewed: 07/13/2011 [facility] Patient Information 2015 [place], [place]. This information is not intended to replace advice given to you by your health care provider. Make sure you discuss any questions you have with your health care provider.    Stress and Stress Management Stress is a normal reaction to life events. It is what you feel when life demands more than you are used to or more than you can handle. Some stress can be useful. For example, the stress reaction can help you catch the last bus of the day, study for a test, or meet a deadline at work. But stress that occurs too often or for too long can cause problems. It can affect your emotional health and interfere with relationships and normal daily activities. Too much stress can weaken your immune system and increase your risk for physical illness. If you already have a medical problem, stress can make it worse. CAUSES  All sorts of life events may cause stress. An event that causes stress for one person may not be stressful for another person. Major life events commonly cause stress. These may be positive or negative. Examples include losing your job, moving into a new home, getting married, having a baby, or  losing a loved one. Less obvious life events may also cause stress, especially if they occur day after day or in combination. Examples include working long hours, driving in traffic, caring for children, being in debt, or being in a difficult relationship. SIGNS AND SYMPTOMS Stress may cause emotional symptoms including, the following:  Anxiety. This is feeling worried, afraid, on edge, overwhelmed, or out of control.  Anger. This is feeling irritated or impatient.  Depression. This is feeling sad, down, helpless, or guilty.  Difficulty focusing, remembering, or making decisions. Stress may cause physical symptoms, including the following:   Aches and pains. These may affect your head, neck, back, stomach, or other areas of your body.  Tight muscles or clenched jaw.  Low energy or trouble sleeping. Stress may cause unhealthy behaviors, including the following:   Eating to feel better (overeating) or skipping meals.  Sleeping too little, too much, or both.  Working too much or putting off tasks (procrastination).  Smoking, drinking alcohol, or using drugs to feel better. DIAGNOSIS  Stress is diagnosed through an assessment by your health care provider. Your health care provider will ask questions about your symptoms and any stressful life events.Your health care provider will also ask about your medical history and may order blood tests or other tests. Certain medical conditions and medicine can cause physical symptoms similar to stress. Mental illness can cause emotional symptoms and unhealthy behaviors similar to stress. Your health care provider may refer you to  a mental health professional for further evaluation.  TREATMENT  Stress management is the recommended treatment for stress.The goals of stress management are reducing stressful life events and coping with stress in healthy ways.  Techniques for reducing stressful life events include the following:  Stress  identification. Self-monitor for stress and identify what causes stress for you. These skills may help you to avoid some stressful events.  Time management. Set your priorities, keep a calendar of events, and learn to say "no." These tools can help you avoid making too many commitments. Techniques for coping with stress include the following:  Rethinking the problem. Try to think realistically about stressful events rather than ignoring them or overreacting. Try to find the positives in a stressful situation rather than focusing on the negatives.  Exercise. Physical exercise can release both physical and emotional tension. The key is to find a form of exercise you enjoy and do it regularly.  Relaxation techniques. These relax the body and mind. Examples include yoga, meditation, tai chi, biofeedback, deep breathing, progressive muscle relaxation, listening to music, being out in nature, journaling, and other hobbies. Again, the key is to find one or more that you enjoy and can do regularly.  Healthy lifestyle. Eat a balanced diet, get plenty of sleep, and do not smoke. Avoid using alcohol or drugs to relax.  Strong support network. Spend time with family, friends, or other people you enjoy being around.Express your feelings and talk things over with someone you trust. Counseling or talktherapy with a mental health professional may be helpful if you are having difficulty managing stress on your own. Medicine is typically not recommended for the treatment of stress.Talk to your health care provider if you think you need medicine for symptoms of stress. HOME CARE INSTRUCTIONS  Keep all follow-up visits as directed by your health care provider.  Take all medicines as directed by your health care provider. SEEK MEDICAL CARE IF:  Your symptoms get worse or you start having new symptoms.  You feel overwhelmed by your problems and can no longer manage them on your own. SEEK IMMEDIATE MEDICAL CARE  IF:  You feel like hurting yourself or someone else. Document Released: 10/07/2000 Document Revised: 08/28/2013 Document Reviewed: 12/06/2012 [facility] Patient Information 2015 [place], [place]. This information is not intended to replace advice given to you by your health care provider. Make sure you discuss any questions you have with your health care provider.    Diabetes Mellitus and Food It is important for you to manage your blood sugar (glucose) level. Your blood glucose level can be greatly affected by what you eat. Eating healthier foods in the appropriate amounts throughout the day at about the same time each day will help you control your blood glucose level. It can also help slow or prevent worsening of your diabetes mellitus. Healthy eating may even help you improve the level of your blood pressure and reach or maintain a healthy weight.  HOW CAN FOOD AFFECT ME? Carbohydrates Carbohydrates affect your blood glucose level more than any other type of food. Your dietitian will help you determine how many carbohydrates to eat at each meal and teach you how to count carbohydrates. Counting carbohydrates is important to keep your blood glucose at a healthy level, especially if you are using insulin or taking certain medicines for diabetes mellitus. Alcohol Alcohol can cause sudden decreases in blood glucose (hypoglycemia), especially if you use insulin or take certain medicines for diabetes mellitus. Hypoglycemia can be a life-threatening  condition. Symptoms of hypoglycemia (sleepiness, dizziness, and disorientation) are similar to symptoms of having too much alcohol.  If your health care provider has given you approval to drink alcohol, do so in moderation and use the following guidelines:  Women should not have more than one drink per day, and men should not have more than two drinks per day. One drink is equal to:  12 oz of beer.  5 oz of wine.  1 oz of hard liquor.  Do not drink on  an empty stomach.  Keep yourself hydrated. Have water, diet soda, or unsweetened iced tea.  Regular soda, juice, and other mixers might contain a lot of carbohydrates and should be counted. WHAT FOODS ARE NOT RECOMMENDED? As you make food choices, it is important to remember that all foods are not the same. Some foods have fewer nutrients per serving than other foods, even though they might have the same number of calories or carbohydrates. It is difficult to get your body what it needs when you eat foods with fewer nutrients. Examples of foods that you should avoid that are high in calories and carbohydrates but low in nutrients include:  Trans fats (most processed foods list trans fats on the Nutrition Facts label).  Regular soda.  Juice.  Candy.  Sweets, such as cake, pie, doughnuts, and cookies.  Fried foods. WHAT FOODS CAN I EAT? Have nutrient-rich foods, which will nourish your body and keep you healthy. The food you should eat also will depend on several factors, including:  The calories you need.  The medicines you take.  Your weight.  Your blood glucose level.  Your blood pressure level.  Your cholesterol level. You also should eat a variety of foods, including:  Protein, such as meat, poultry, fish, tofu, nuts, and seeds (lean animal proteins are best).  Fruits.  Vegetables.  Dairy products, such as milk, cheese, and yogurt (low fat is best).  Breads, grains, pasta, cereal, rice, and beans.  Fats such as olive oil, trans fat-free margarine, canola oil, avocado, and olives. DOES EVERYONE WITH DIABETES MELLITUS HAVE THE SAME MEAL PLAN? Because every person with diabetes mellitus is different, there is not one meal plan that works for everyone. It is very important that you meet with a dietitian who will help you create a meal plan that is just right for you. Document Released: 01/08/2005 Document Revised: 04/18/2013 Document Reviewed: 03/10/2013 [facility]  Patient Information 2015 [place], [place]. This information is not intended to replace advice given to you by your health care provider. Make sure you discuss any questions you have with your health care provider.    Type 2 Diabetes Mellitus Type 2 diabetes mellitus, often simply referred to as type 2 diabetes, is a long-lasting (chronic) disease. In type 2 diabetes, the pancreas does not make enough insulin (a hormone), the cells are less responsive to the insulin that is made (insulin resistance), or both. Normally, insulin moves sugars from food into the tissue cells. The tissue cells use the sugars for energy. The lack of insulin or the lack of normal response to insulin causes excess sugars to build up in the blood instead of going into the tissue cells. As a result, high blood sugar (hyperglycemia) develops. The effect of high sugar (glucose) levels can cause many complications. Type 2 diabetes was also previously called adult-onset diabetes, but it can occur at any age.  RISK FACTORS  A person is predisposed to developing type 2 diabetes if someone in the  family has the disease and also has one or more of the following primary risk factors:  Overweight.  An inactive lifestyle.  A history of consistently eating high-calorie foods. Maintaining a normal weight and regular physical activity can reduce the chance of developing type 2 diabetes. SYMPTOMS  A person with type 2 diabetes may not show symptoms initially. The symptoms of type 2 diabetes appear slowly. The symptoms include:  Increased thirst (polydipsia).  Increased urination (polyuria).  Increased urination during the night (nocturia).  Weight loss. This weight loss may be rapid.  Frequent, recurring infections.  Tiredness (fatigue).  Weakness.  Vision changes, such as blurred vision.  Fruity smell to your breath.  Abdominal pain.  Nausea or vomiting.  Cuts or bruises which are slow to heal.  Tingling or  numbness in the hands or feet. DIAGNOSIS Type 2 diabetes is frequently not diagnosed until complications of diabetes are present. Type 2 diabetes is diagnosed when symptoms or complications are present and when blood glucose levels are increased. Your blood glucose level may be checked by one or more of the following blood tests:  A fasting blood glucose test. You will not be allowed to eat for at least 8 hours before a blood sample is taken.  A random blood glucose test. Your blood glucose is checked at any time of the day regardless of when you ate.  A hemoglobin A1c blood glucose test. A hemoglobin A1c test provides information about blood glucose control over the previous 3 months.  An oral glucose tolerance test (OGTT). Your blood glucose is measured after you have not eaten (fasted) for 2 hours and then after you drink a glucose-containing beverage. TREATMENT   You may need to take insulin or diabetes medicine daily to keep blood glucose levels in the desired range.  If you use insulin, you may need to adjust the dosage depending on the carbohydrates that you eat with each meal or snack. The treatment goal is to maintain the before meal blood sugar (preprandial glucose) level at 70-130 mg/dL. HOME CARE INSTRUCTIONS   Have your hemoglobin A1c level checked twice a year.  Perform daily blood glucose monitoring as directed by your health care provider.  Monitor urine ketones when you are ill and as directed by your health care provider.  Take your diabetes medicine or insulin as directed by your health care provider to maintain your blood glucose levels in the desired range.  Never run out of diabetes medicine or insulin. It is needed every day.  If you are using insulin, you may need to adjust the amount of insulin given based on your intake of carbohydrates. Carbohydrates can raise blood glucose levels but need to be included in your diet. Carbohydrates provide vitamins, minerals,  and fiber which are an essential part of a healthy diet. Carbohydrates are found in fruits, vegetables, whole grains, dairy products, legumes, and foods containing added sugars.  Eat healthy foods. You should make an appointment to see a registered dietitian to help you create an eating plan that is right for you.  Lose weight if you are overweight.  Carry a medical alert card or wear your medical alert jewelry.  Carry a 15-gram carbohydrate snack with you at all times to treat low blood glucose (hypoglycemia). Some examples of 15-gram carbohydrate snacks include:  Glucose tablets, 3 or 4.  Glucose gel, 15-gram tube.  Raisins, 2 tablespoons (24 grams).  Jelly beans, 6.  Animal crackers, 8.  Regular pop, 4 ounces (120  mL).  Gummy treats, 9.  Recognize hypoglycemia. Hypoglycemia occurs with blood glucose levels of 70 mg/dL and below. The risk for hypoglycemia increases when fasting or skipping meals, during or after intense exercise, and during sleep. Hypoglycemia symptoms can include:  Tremors or shakes.  Decreased ability to concentrate.  Sweating.  Increased heart rate.  Headache.  Dry mouth.  Hunger.  Irritability.  Anxiety.  Restless sleep.  Altered speech or coordination.  Confusion.  Treat hypoglycemia promptly. If you are alert and able to safely swallow, follow the 15:15 rule:  Take 15-20 grams of rapid-acting glucose or carbohydrate. Rapid-acting options include glucose gel, glucose tablets, or 4 ounces (120 mL) of fruit juice, regular soda, or low-fat milk.  Check your blood glucose level 15 minutes after taking the glucose.  Take 15-20 grams more of glucose if the repeat blood glucose level is still 70 mg/dL or below.  Eat a meal or snack within 1 hour once blood glucose levels return to normal.  Be alert to feeling very thirsty and urinating more frequently than usual, which are early signs of hyperglycemia. An early awareness of hyperglycemia  allows for prompt treatment. Treat hyperglycemia as directed by your health care provider.  Engage in at least 150 minutes of moderate-intensity physical activity a week, spread over at least 3 days of the week or as directed by your health care provider. In addition, you should engage in resistance exercise at least 2 times a week or as directed by your health care provider. Try to spend no more than 90 minutes at one time inactive.  Adjust your medicine and food intake as needed if you start a new exercise or sport.  Follow your sick-day plan anytime you are unable to eat or drink as usual.  Do not use any tobacco products including cigarettes, chewing tobacco, or electronic cigarettes. If you need help quitting, ask your health care provider.  Limit alcohol intake to no more than 1 drink per day for nonpregnant women and 2 drinks per day for men. You should drink alcohol only when you are also eating food. Talk with your health care provider whether alcohol is safe for you. Tell your health care provider if you drink alcohol several times a week.  Keep all follow-up visits as directed by your health care provider. This is important.  Schedule an eye exam soon after the diagnosis of type 2 diabetes and then annually.  Perform daily skin and foot care. Examine your skin and feet daily for cuts, bruises, redness, nail problems, bleeding, blisters, or sores. A foot exam by a health care provider should be done annually.  Brush your teeth and gums at least twice a day and floss at least once a day. Follow up with your dentist regularly.  Share your diabetes management plan with your workplace or school.  Stay up-to-date with immunizations. It is recommended that people with diabetes who are over 34 years old get the pneumonia vaccine. In some cases, two separate shots may be given. Ask your health care provider if your pneumonia vaccination is up-to-date.  Learn to manage stress.  Obtain  ongoing diabetes education and support as needed.  Participate in or seek rehabilitation as needed to maintain or improve independence and quality of life. Request a physical or occupational therapy referral if you are having foot or hand numbness, or difficulties with grooming, dressing, eating, or physical activity. SEEK MEDICAL CARE IF:   You are unable to eat food or drink fluids  for more than 6 hours.  You have nausea and vomiting for more than 6 hours.  Your blood glucose level is over 240 mg/dL.  There is a change in mental status.  You develop an additional serious illness.  You have diarrhea for more than 6 hours.  You have been sick or have had a fever for a couple of days and are not getting better.  You have pain during any physical activity.  SEEK IMMEDIATE MEDICAL CARE IF:  You have difficulty breathing.  You have moderate to large ketone levels. MAKE SURE YOU:  Understand these instructions.  Will watch your condition.  Will get help right away if you are not doing well or get worse. Document Released: 04/13/2005 Document Revised: 08/28/2013 Document Reviewed: 11/10/2011 [facility] Patient Information 2015 [place], [place]. This information is not intended to replace advice given to you by your health care provider. Make sure you discuss any questions you have with your health care provider.     Emergency Department Resource Guide 1) Find a Doctor and Pay Out of Pocket Although you won't have to find out who is covered by your insurance plan, it is a good idea to ask around and get recommendations. You will then need to call the office and see if the doctor you have chosen will accept you as a new patient and what types of options they offer for patients who are self-pay. Some doctors offer discounts or will set up payment plans for their patients who do not have insurance, but you will need to ask so you aren't surprised when you get to your appointment.  2)  Contact Your Local Health Department Not all health departments have doctors that can see patients for sick visits, but many do, so it is worth a call to see if yours does. If you don't know where your local health department is, you can check in your phone book. The CDC also has a tool to help you locate your state's health department, and many state websites also have listings of all of their local health departments.  3) Find a [facility] If your illness is not likely to be very severe or complicated, you may want to try a walk in clinic. These are popping up all over the country in pharmacies, drugstores, and shopping centers. They're usually staffed by nurse practitioners or physician assistants that have been trained to treat common illnesses and complaints. They're usually fairly quick and inexpensive. However, if you have serious medical issues or chronic medical problems, these are probably not your best option.  No Primary Care Doctor: - Call Health Connect at  [phone] - they can help you locate a primary care doctor that  accepts your insurance, provides certain services, etc. - Physician Referral Service- [phone]  Chronic Pain Problems: Organization         Address  Phone   Notes  [facility]  [phone] Patients need to be referred by their primary care doctor.   Medication Assistance: Organization         Address  Phone   Notes  [facility] Medication [facility] [address] [place], [place] [address] [phone] --Must be a resident of [facility] -- Must have NO insurance coverage whatsoever (no Medicaid/ Medicare, etc.) -- The pt. MUST have a primary care doctor that directs their care regularly and follows them in the community   [facility]  [phone]   [address]  (  [phone]    Agencies that provide inexpensive medical care: Organization         Address  Phone   Notes  [place]   [phone]   [facility]    [phone]   [facility] [place], [place] [phone]   [place] [address] [place] [phone]   Planned Parenthood    [phone]   [facility]    [phone]   [place] and [place] [address], [place] Phone:  [phone], Fax:  [phone] Hours of Operation:  9 am - 6 pm, M-F.  Also accepts Medicaid/Medicare and self-pay.  [facility] for [place], [address], [place] Phone: [phone], Fax: [phone]. Hours of Operation:  8:30 am - 5:30 pm, M-F.  Also accepts Medicaid and self-pay.  [facility] [place] [address] [place] Phone: [phone]   [place], [place], [place] [phone], Ext. [phone] Mondays & Thursdays: 7-9 AM.  First 15 patients are seen on a first come, first serve basis.    [place] Providers:  Organization         Address  Phone   Notes  [facility] [address], [place] [phone] Also accepts self-pay patients.  [facility] [address], [place]  [phone]   [place], [address], [place] [phone]   [facility] [address] [place] [phone]   Lucianne Lei [address], [place]   [phone] Only accepts [place] Access [place] patients after they have their name applied to their card.   Self-Pay (no insurance) in [place] [facility]:  Organization         Address  Phone   Notes  Sickle Cell Patients, [facility] Internal Medicine [place] [phone]   [facility] Urgent Care [place] [phone]   [facility] [place]  [place], [place], [place] [phone]   [facility]/Dr. Osei-Bonsu  [address] [place] or [address], [place] [phone] Phone number for both [place] and [place] locations is the same.  [facility] [address], [place] [phone]   [facility] [address] [place] or [address] [phone]   [facility] [address] [place] [phone], phone; [phone], fax Sees patients 1st and 3rd Saturday of every month.  Must not qualify for public or private insurance (i.e. Medicaid, Medicare, [place] Health Choice, Veterans' Benefits)  Household income should be no more than 200% of the poverty level The clinic cannot treat you if you are pregnant or think you are pregnant  Sexually transmitted diseases are not treated at the clinic.    Dental Care: Organization         Address  Phone  Notes  [facility] Department of [facility] [place] [phone] Accepts children up to age 45 who are enrolled in [place] or [place]; pregnant women with a Medicaid card; and children who  have applied for Medicaid or [place] Health Choice, but were declined, whose parents can pay a reduced fee at time of service.  [facility] Department of [facility]  [address], [place] [phone] Accepts children up to age 76 who are enrolled in [place] or [place]; pregnant women with a Medicaid card; and children who have applied for Medicaid or [place] Health Choice, but were declined, whose parents can pay a reduced fee at time of service.  [facility] Adult [facility] Access PROGRAM  [place] [phone] Patients are seen by appointment only. Walk-ins are not accepted. [place] will see patients 22 years of age and older. Monday - Tuesday  (8am-5pm) Most Wednesdays (8:30-5pm) $30 per visit, cash only  [facility] Adult Dental Access PROGRAM  [address], [facility] [phone] Patients are seen by appointment only. Walk-ins are not accepted. [place] will see patients 75 years of age and older. One Wednesday Evening (Monthly: Volunteer Based).  $30 per visit, cash only  [place]  [phone] for adults; Children under age 68, call Graduate Pediatric Dentistry at [phone]. Children aged 35-14, please call [phone] to request a pediatric application.  Dental services are provided in all areas of dental care including fillings, crowns and bridges, complete and partial dentures, implants, gum treatment, root canals, and extractions. Preventive care is also provided. Treatment is provided to both adults and children. Patients are selected via a lottery and there is often a waiting list.   [facility] [address] [place]  [phone] [email or web address]   [facility] [address] [place], [place] [phone], Ext. [phone] Second and Fourth Thursday of each month, opens at 6:30 AM; Clinic ends at 9 AM.  Patients are seen on a first-come first-served basis, and a limited number are seen during each clinic.   [facility]  [address] [place] D[place], [place] [phone]   Eligibility Requirements You must have lived in [place], [place], or [place] counties for at least the last three months.   You cannot be eligible for state or federal sponsored [employer], including [employer], [place], or [employer].   You generally cannot be eligible for healthcare insurance through your employer.    How to apply: Eligibility screenings are held every Tuesday and Wednesday afternoon from 1:00 pm until 4:00 pm. You do not need an appointment for the interview!  [facility] [address], [place], [place]   [place]  [place] Department  [place]  [phone]    Behavioral Health Resources in the Community: Intensive Outpatient Programs Organization         Address  Phone  Notes  [place]. [address] [place], [place] [phone]   [facility] Outpatient [address], [place], [place]   ADS: [facility] Drug S[facility] [address] [place], [place]   [place] [address]  [place], [place] or [phone]   Substance Abuse Resources Organization         Address  Phone  Notes  Alcohol and Drug Services  [phone]   [facility]  [phone]   [facility] [place]   [facility]  [phone]   Residential & Outpatient  Substance Abuse Program  [phone]   Psychological Services Organization         Address  Phone  Notes  [place]  [place]  [phone]   [place] [address], [place] or [phone]    Mobile Crisis Teams Organization         Address  Phone  Notes  [facility], Mobile Crisis Care Unit  [phone]   [facility]  [address] [place], [place]   [place] [address], [place] [phone]    Self-Help/Support Groups Organization         Address  Phone             Notes  [place]. of [place] - variety of support groups  [place] Call for more information  Narcotics Anonymous (NA), [facility]aring [facility] [address], [employer] [place]  2 meetings at this location   Special educational needs teacher         Address  Phone  Notes  [facility]idential [facility] [place],    [place]  [phone]   [facility]  [address], [place] [address], [place], [place]   [place], [place] [phone] Admissions: 8am-3pm M-F  Incentives Substance [place] [address],    [place], [place] [phone]   [facility] [address] [place], [place], [place]   [facility] [address],  [place], [place]   [facility] Programs - Intensive Outpatient [address], [place], [place]   [facility] ([place].,  [place] , [place] [phone] or [phone]   [facility] (RTS) [address] [place], [place] Accepts Medicaid  [facility] [place] [address]  [place] [phone] Substance Abuse/Addiction Treatment   [facility] Organization         Address  Phone  Notes  [facility]  [phone]   Domenic Schwab, PhD [address] Arlis [place], [place]   [phone] or [phone]   [place], [place] [phone]   [facility] [address], [place], [place] [phone] Insurance/Medicaid/sponsorship through [facility] and Families [address], [address] [place]                                    [place], [place] [phone] [place] [address] [place], [place] [phone]    Dr. Adele Schilder  [phone]   [facility] Dept. 1) [address],  2) [place] 3)  [place] [address], [place] [phone]  [phone]   [place] [phone] or [phone] (After Hours)

## 2014-10-06 NOTE — ED Notes (Signed)
Tyler Burgess, SW, per Dr Norman Herrlich request for pt to be given Doxy and Insulin if at all possible d/t pt states could not afford. Will also discuss w/Wanda, CM.

## 2014-10-06 NOTE — ED Notes (Signed)
Instructed patient to call using the call bell if needed.  Voiced understanding.

## 2014-10-06 NOTE — Care Management (Signed)
Patient refused to wait for CM, he left stating he will take prescriptions to the [facility] for medication assistance, no further ED CM needs were identified.

## 2014-10-06 NOTE — ED Notes (Addendum)
Patient ambulated to the BR without difficulty. Patient denies wanting to hurt himself or other at this time.  Staffing notified and sitter sent home

## 2014-10-06 NOTE — ED Provider Notes (Addendum)
Pod C and Psych team indicates patient cleared for d/c, has been seen by social work, and is ready for d/c.   Pt provided resource guide for additional community resources.  Glucose much improved.   Pt eating and drinking. Normal mood/affect. No SI.  Right foot stump/surgical wound re-check - sutures intact, healing. Minimal erythema. No malodor, no purulent discharge. No cellulitis/erythema extending proximally up foot.   On review recent prior ed visit, pts surgeon had been consulted and rx provided for doxy - pt states he has not filled. No fever or chills.  Patient indicates he has appt w his surgeon this Monday, which he will keep.  States his plan is to get into shelter.    CM/SW asked to facilitate home med incl antibiotic and insulin/diabetic meds.   Pt currently appears stable for d/c.  Return precautions provided.       Cathren Laine, MD 10/06/14 [phone]

## 2014-10-10 ENCOUNTER — Emergency Department (HOSPITAL_COMMUNITY)
Admission: EM | Admit: 2014-10-10 | Discharge: 2014-10-10 | Disposition: A | Discharge status: Home / Self Care | Payer: Medicaid - Out of State | Attending: Emergency Medicine | Admitting: Emergency Medicine

## 2014-10-10 ENCOUNTER — Encounter (HOSPITAL_COMMUNITY): Payer: Self-pay

## 2014-10-10 ENCOUNTER — Emergency Department (HOSPITAL_COMMUNITY): Payer: Medicaid - Out of State

## 2014-10-10 DIAGNOSIS — W01198A Fall on same level from slipping, tripping and stumbling with subsequent striking against other object, initial encounter: Secondary | ICD-10-CM | POA: Insufficient documentation

## 2014-10-10 DIAGNOSIS — Y9389 Activity, other specified: Secondary | ICD-10-CM | POA: Insufficient documentation

## 2014-10-10 DIAGNOSIS — Y998 Other external cause status: Secondary | ICD-10-CM | POA: Insufficient documentation

## 2014-10-10 DIAGNOSIS — Z794 Long term (current) use of insulin: Secondary | ICD-10-CM | POA: Insufficient documentation

## 2014-10-10 DIAGNOSIS — Y9289 Other specified places as the place of occurrence of the external cause: Secondary | ICD-10-CM | POA: Insufficient documentation

## 2014-10-10 DIAGNOSIS — Y835 Amputation of limb(s) as the cause of abnormal reaction of the patient, or of later complication, without mention of misadventure at the time of the procedure: Secondary | ICD-10-CM | POA: Insufficient documentation

## 2014-10-10 DIAGNOSIS — Z79899 Other long term (current) drug therapy: Secondary | ICD-10-CM | POA: Insufficient documentation

## 2014-10-10 DIAGNOSIS — F329 Major depressive disorder, single episode, unspecified: Secondary | ICD-10-CM | POA: Insufficient documentation

## 2014-10-10 DIAGNOSIS — E119 Type 2 diabetes mellitus without complications: Secondary | ICD-10-CM | POA: Insufficient documentation

## 2014-10-10 DIAGNOSIS — T8781 Dehiscence of amputation stump: Secondary | ICD-10-CM | POA: Insufficient documentation

## 2014-10-10 DIAGNOSIS — Z7982 Long term (current) use of aspirin: Secondary | ICD-10-CM | POA: Insufficient documentation

## 2014-10-10 DIAGNOSIS — Z720 Tobacco use: Secondary | ICD-10-CM | POA: Insufficient documentation

## 2014-10-10 DIAGNOSIS — W19XXXA Unspecified fall, initial encounter: Secondary | ICD-10-CM

## 2014-10-10 DIAGNOSIS — T8131XA Disruption of external operation (surgical) wound, not elsewhere classified, initial encounter: Secondary | ICD-10-CM

## 2014-10-10 MED ORDER — CEPHALEXIN 500 MG PO CAPS: 500.0000 mg | ORAL_CAPSULE | Freq: Four times a day (QID) | ORAL | Status: DC

## 2014-10-10 NOTE — ED Notes (Signed)
Patient c/o right foot pain. Patient states he tripped while walking down stairs and his post-op shoe "came undone" and he busted sutures to the right foot. Patient recently has toes amputated in May. Patient denies head, neck, or back injury from fall.

## 2014-10-10 NOTE — ED Notes (Addendum)
Pt states that he may have torn a suture on right foot.  Dressing removed, some bloody drainage noted.  Wound edges are unaproximated.  No active bleeding noted at this time.  Pt states he did see his provider two days ago and was informed wound was "healing well".

## 2014-10-10 NOTE — ED Provider Notes (Signed)
CSN: [id number]     Arrival date & time 10/10/14  2105 History   First MD Initiated Contact with Patient 10/10/14 2145     Chief Complaint  Patient presents with  . Foot Pain     (Consider location/radiation/quality/duration/timing/severity/associated sxs/prior Treatment) Patient is a 59 y.o. male presenting with lower extremity pain. The history is provided by the patient.  Foot Pain This is a new problem.   Cray Prew is a 59 y.o. male with hx of diabetes who presents to the ED with foot pain s/p fall. He recently had his right toes amputated and was wearing a post op shoe and tripped and tore the sutures open. He denies any other problems.   Past Medical History  Diagnosis Date  . Diabetes mellitus without complication   . Depression    Past Surgical History  Procedure Laterality Date  . Right arm    . Cholecystectomy    . Amputation toe Right 09/03/2014    Procedure: AMPUTATION RIGHT GREAT TOE AND SECOND TOE;  Surgeon: Franky Macho Md, MD;  Location: [facility];  Service: General;  Laterality: Right;  right great and second toes  . Amputation Right 09/20/2014    Procedure: RIGHT TRANSMETATARSAL AMPUTATION;  Surgeon: Nadara Mustard, MD;  Location: [facility];  Service: Orthopedics;  Laterality: Right;   Family History  Problem Relation Age of Onset  . Diabetes Mother   . Depression Father   . Depression Brother   . Diabetes Other   . Depression Other    History  Substance Use Topics  . Smoking status: Current Every Day Smoker -- 0.50 packs/day for 35 years    Types: Cigarettes  . Smokeless tobacco: Not on file  . Alcohol Use: 1.2 oz/week    2 Cans of beer per week     Comment: once per month    Review of Systems Negative except as stated in HPI   Allergies  Review of patient's allergies indicates no known allergies.  Home Medications   Prior to Admission medications   Medication Sig Start Date End Date Taking? Authorizing Provider  acetaminophen (TYLENOL) 325 MG  tablet Take 2 tablets (650 mg total) by mouth every 6 (six) hours as needed for mild pain (or Fever >/= 101). Patient not taking: Reported on 10/04/2014 09/26/14   Alison Murray, MD  ARIPiprazole (ABILIFY) 5 MG tablet Take 1 tablet (5 mg total) by mouth 2 (two) times daily. Patient not taking: Reported on 10/04/2014 09/26/14   Alison Murray, MD  aspirin 81 MG chewable tablet Chew 1 tablet (81 mg total) by mouth daily. Patient not taking: Reported on 10/04/2014 09/26/14   Alison Murray, MD  benztropine (COGENTIN) 0.5 MG tablet Take 1 tablet (0.5 mg total) by mouth 2 (two) times daily. Patient not taking: Reported on 10/04/2014 09/26/14   Alison Murray, MD  cephALEXin (KEFLEX) 500 MG capsule Take 1 capsule (500 mg total) by mouth 4 (four) times daily. 10/10/14   Hope Orlene Och, NP  gabapentin (NEURONTIN) 300 MG capsule Take 1 capsule (300 mg total) by mouth 3 (three) times daily. Patient not taking: Reported on 10/04/2014 09/26/14   Alison Murray, MD  HYDROcodone-acetaminophen (NORCO/VICODIN) 5-325 MG per tablet Take 1 tablet by mouth every 4 (four) hours as needed for moderate pain. Patient not taking: Reported on 10/04/2014 10/03/14   Azalia Bilis, MD  insulin aspart (NOVOLOG) 100 UNIT/ML injection Inject 5 Units into the skin 3 (three) times daily with  meals. Patient not taking: Reported on 10/04/2014 09/26/14   Alison Murray, MD  insulin glargine (LANTUS) 100 UNIT/ML injection Inject 0.2 mLs (20 Units total) into the skin at bedtime. Patient not taking: Reported on 10/10/2014 09/26/14   Alison Murray, MD  Multiple Vitamin (MULTIVITAMIN WITH MINERALS) TABS tablet Take 1 tablet by mouth daily. Patient not taking: Reported on 09/15/2014 04/18/14   Thermon Leyland, NP  saccharomyces boulardii (FLORASTOR) 250 MG capsule Take 1 capsule (250 mg total) by mouth 2 (two) times daily. Patient not taking: Reported on 10/04/2014 09/26/14   Alison Murray, MD  senna-docusate (SENOKOT-S) 8.6-50 MG per tablet Take 1 tablet by mouth at bedtime as  needed for mild constipation. Patient not taking: Reported on 10/04/2014 09/26/14   Alison Murray, MD  sertraline (ZOLOFT) 100 MG tablet Take 1 tablet (100 mg total) by mouth daily. Patient not taking: Reported on 10/10/2014 09/26/14   Alison Murray, MD   BP 141/96 mmHg  Pulse 63  Temp(Src) 97.8 F (36.6 C) (Oral)  Resp 20  Ht  (1.778 m)  Wt 170 lb (77.111 kg)  BMI 24.39 kg/m2  SpO2 100% Physical Exam  Constitutional: He is oriented to person, place, and time. He appears well-developed and well-nourished. No distress.  HENT:  Head: Normocephalic.  Eyes: EOM are normal.  Neck: Neck supple.  Cardiovascular: Normal rate.   Pulmonary/Chest: Effort normal.  Abdominal: Soft. There is no tenderness.  Musculoskeletal:       Feet:  Right foot with toes amputated and wound open where part of the sutures broke. Swelling of the foot and lower leg noted.   Neurological: He is alert and oriented to person, place, and time. No cranial nerve deficit.  Skin: Skin is warm and dry.  Psychiatric: He has a normal mood and affect. His behavior is normal.  Nursing note and vitals reviewed.   ED Course  Procedures (including critical care time) Dr. Juleen China in to examine the patient and applied wet to dry dressing and instructed patient on wound care until follow up for his scheduled appointment this week.   Labs Review Labs Reviewed - No data to display  Imaging Review Dg Foot Complete Right  10/10/2014   CLINICAL DATA:  Recent metatarsal amputation. Fell. Pain and swelling.  EXAM: RIGHT FOOT COMPLETE - 3+ VIEW  COMPARISON:  10/01/2014  FINDINGS: Stable surgical changes from a proximal metatarsal midfoot amputation. There is air in the soft tissues which was not present on the prior film. This could be due to disruption of the wound. I do not see any definite findings for acute fracture or osteomyelitis.  IMPRESSION: New areas noted in the soft tissues of the foot overlying the amputation. This could  be due to disruption of the wound or wound dehiscence.  Stable appearance at the amputation site. No fracture or evidence of osteomyelitis.   Electronically Signed   By: Rudie Meyer M.D.   On: 10/10/2014 22:36     MDM  59 y.o. male with open wound s/p injury that caused sutures to break. Stable for d/c with no acute findings on x-ray. Dr. Juleen China discussed with the patient plan of care and all questioned fully answered. He will return here as needed if any problems arise.   Final diagnoses:  Wound dehiscence, initial encounter  Fall, initial encounter      [facility], NP 10/11/14 0106  Raeford Razor, MD 10/12/14 1059

## 2014-10-10 NOTE — Discharge Instructions (Signed)
Use the wet to dry dressing as instructed by Dr. Juleen China. Follow up with your doctor as scheduled. Take the antibiotic as directed. Return as needed.

## 2014-10-12 ENCOUNTER — Emergency Department (HOSPITAL_COMMUNITY): Payer: Medicaid Other

## 2014-10-12 ENCOUNTER — Inpatient Hospital Stay (HOSPITAL_COMMUNITY)
Admission: EM | Admit: 2014-10-12 | Discharge: 2014-10-18 | DRG: 857 | Disposition: A | Discharge status: Skilled Nursing Facility | Payer: Medicaid Other | Attending: Internal Medicine | Admitting: Internal Medicine

## 2014-10-12 ENCOUNTER — Encounter (HOSPITAL_COMMUNITY): Payer: Self-pay | Admitting: Emergency Medicine

## 2014-10-12 ENCOUNTER — Inpatient Hospital Stay (HOSPITAL_COMMUNITY): Payer: Medicaid Other

## 2014-10-12 DIAGNOSIS — Z91120 Patient's intentional underdosing of medication regimen due to financial hardship: Secondary | ICD-10-CM | POA: Diagnosis present

## 2014-10-12 DIAGNOSIS — Z590 Homelessness: Secondary | ICD-10-CM | POA: Diagnosis not present

## 2014-10-12 DIAGNOSIS — F330 Major depressive disorder, recurrent, mild: Secondary | ICD-10-CM | POA: Diagnosis present

## 2014-10-12 DIAGNOSIS — F489 Nonpsychotic mental disorder, unspecified: Secondary | ICD-10-CM | POA: Diagnosis present

## 2014-10-12 DIAGNOSIS — T798XXS Other early complications of trauma, sequela: Secondary | ICD-10-CM | POA: Diagnosis not present

## 2014-10-12 DIAGNOSIS — T43226A Underdosing of selective serotonin reuptake inhibitors, initial encounter: Secondary | ICD-10-CM | POA: Diagnosis present

## 2014-10-12 DIAGNOSIS — D638 Anemia in other chronic diseases classified elsewhere: Secondary | ICD-10-CM | POA: Diagnosis present

## 2014-10-12 DIAGNOSIS — F99 Mental disorder, not otherwise specified: Secondary | ICD-10-CM | POA: Diagnosis present

## 2014-10-12 DIAGNOSIS — T8131XA Disruption of external operation (surgical) wound, not elsewhere classified, initial encounter: Secondary | ICD-10-CM | POA: Diagnosis present

## 2014-10-12 DIAGNOSIS — M7989 Other specified soft tissue disorders: Secondary | ICD-10-CM

## 2014-10-12 DIAGNOSIS — Z89431 Acquired absence of right foot: Secondary | ICD-10-CM

## 2014-10-12 DIAGNOSIS — T39016A Underdosing of aspirin, initial encounter: Secondary | ICD-10-CM | POA: Diagnosis present

## 2014-10-12 DIAGNOSIS — Z9049 Acquired absence of other specified parts of digestive tract: Secondary | ICD-10-CM | POA: Diagnosis present

## 2014-10-12 DIAGNOSIS — T43596A Underdosing of other antipsychotics and neuroleptics, initial encounter: Secondary | ICD-10-CM | POA: Diagnosis present

## 2014-10-12 DIAGNOSIS — E1152 Type 2 diabetes mellitus with diabetic peripheral angiopathy with gangrene: Secondary | ICD-10-CM | POA: Diagnosis present

## 2014-10-12 DIAGNOSIS — L089 Local infection of the skin and subcutaneous tissue, unspecified: Secondary | ICD-10-CM | POA: Diagnosis present

## 2014-10-12 DIAGNOSIS — T798XXD Other early complications of trauma, subsequent encounter: Secondary | ICD-10-CM | POA: Diagnosis not present

## 2014-10-12 DIAGNOSIS — F1020 Alcohol dependence, uncomplicated: Secondary | ICD-10-CM | POA: Diagnosis present

## 2014-10-12 DIAGNOSIS — F411 Generalized anxiety disorder: Secondary | ICD-10-CM | POA: Diagnosis present

## 2014-10-12 DIAGNOSIS — T798XXA Other early complications of trauma, initial encounter: Secondary | ICD-10-CM | POA: Diagnosis not present

## 2014-10-12 DIAGNOSIS — E1165 Type 2 diabetes mellitus with hyperglycemia: Secondary | ICD-10-CM | POA: Diagnosis present

## 2014-10-12 DIAGNOSIS — E1142 Type 2 diabetes mellitus with diabetic polyneuropathy: Secondary | ICD-10-CM | POA: Diagnosis present

## 2014-10-12 DIAGNOSIS — F17210 Nicotine dependence, cigarettes, uncomplicated: Secondary | ICD-10-CM | POA: Diagnosis present

## 2014-10-12 DIAGNOSIS — Y835 Amputation of limb(s) as the cause of abnormal reaction of the patient, or of later complication, without mention of misadventure at the time of the procedure: Secondary | ICD-10-CM | POA: Diagnosis present

## 2014-10-12 DIAGNOSIS — E1140 Type 2 diabetes mellitus with diabetic neuropathy, unspecified: Secondary | ICD-10-CM | POA: Diagnosis present

## 2014-10-12 DIAGNOSIS — Z833 Family history of diabetes mellitus: Secondary | ICD-10-CM | POA: Diagnosis not present

## 2014-10-12 DIAGNOSIS — R739 Hyperglycemia, unspecified: Secondary | ICD-10-CM

## 2014-10-12 DIAGNOSIS — T814XXA Infection following a procedure, initial encounter: Principal | ICD-10-CM | POA: Diagnosis present

## 2014-10-12 DIAGNOSIS — M868X7 Other osteomyelitis, ankle and foot: Secondary | ICD-10-CM | POA: Diagnosis present

## 2014-10-12 DIAGNOSIS — T148XXA Other injury of unspecified body region, initial encounter: Secondary | ICD-10-CM

## 2014-10-12 DIAGNOSIS — T383X6A Underdosing of insulin and oral hypoglycemic [antidiabetic] drugs, initial encounter: Secondary | ICD-10-CM | POA: Diagnosis present

## 2014-10-12 LAB — CBC WITH DIFFERENTIAL/PLATELET
BASOS ABS: 0.1 10*3/uL (ref 0.0–0.1)
Basophils Relative: 1 % (ref 0–1)
EOS ABS: 0.1 10*3/uL (ref 0.0–0.7)
Eosinophils Relative: 0 % (ref 0–5)
HEMATOCRIT: 30.4 % — AB (ref 39.0–52.0)
HEMOGLOBIN: 10.1 g/dL — AB (ref 13.0–17.0)
Lymphocytes Relative: 13 % (ref 12–46)
Lymphs Abs: 1.6 10*3/uL (ref 0.7–4.0)
MCH: 31.9 pg (ref 26.0–34.0)
MCHC: 33.2 g/dL (ref 30.0–36.0)
MCV: 95.9 fL (ref 78.0–100.0)
MONOS PCT: 6 % (ref 3–12)
Monocytes Absolute: 0.8 10*3/uL (ref 0.1–1.0)
Neutro Abs: 10.1 10*3/uL — ABNORMAL HIGH (ref 1.7–7.7)
Neutrophils Relative %: 80 % — ABNORMAL HIGH (ref 43–77)
Platelets: 360 10*3/uL (ref 150–400)
RBC: 3.17 MIL/uL — ABNORMAL LOW (ref 4.22–5.81)
RDW: 13.9 % (ref 11.5–15.5)
WBC: 12.6 10*3/uL — ABNORMAL HIGH (ref 4.0–10.5)

## 2014-10-12 LAB — BASIC METABOLIC PANEL
Anion gap: 10 (ref 5–15)
BUN: 14 mg/dL (ref 6–20)
CO2: 25 mmol/L (ref 22–32)
Calcium: 8.5 mg/dL — ABNORMAL LOW (ref 8.9–10.3)
Chloride: 96 mmol/L — ABNORMAL LOW (ref 101–111)
Creatinine, Ser: 1.27 mg/dL — ABNORMAL HIGH (ref 0.61–1.24)
GFR calc Af Amer: >60 mL/min (ref >60)
GFR calc non Af Amer: >60 mL/min (ref >60)
Glucose, Bld: 526 mg/dL — ABNORMAL HIGH (ref 65–99)
Potassium: 4.8 mmol/L (ref 3.5–5.1)
Sodium: 131 mmol/L — ABNORMAL LOW (ref 135–145)

## 2014-10-12 LAB — URINE MICROSCOPIC-ADD ON

## 2014-10-12 LAB — RAPID URINE DRUG SCREEN, HOSP PERFORMED
Amphetamines: NOT DETECTED
BARBITURATES: NOT DETECTED
Benzodiazepines: NOT DETECTED
Cocaine: NOT DETECTED
Opiates: POSITIVE — AB
Tetrahydrocannabinol: NOT DETECTED

## 2014-10-12 LAB — URINALYSIS, ROUTINE W REFLEX MICROSCOPIC
Bilirubin Urine: NEGATIVE
Ketones, ur: NEGATIVE mg/dL
Leukocytes, UA: NEGATIVE
Nitrite: NEGATIVE
Specific Gravity, Urine: 1.02 (ref 1.005–1.030)
Urobilinogen, UA: 0.2 mg/dL (ref 0.0–1.0)
pH: 6 (ref 5.0–8.0)

## 2014-10-12 LAB — I-STAT CG4 LACTIC ACID, ED: Lactic Acid, Venous: 2.07 mmol/L (ref 0.5–2.0)

## 2014-10-12 LAB — ETHANOL: Alcohol, Ethyl (B): <5 mg/dL (ref <5)

## 2014-10-12 LAB — GLUCOSE, CAPILLARY
Glucose-Capillary: 478 mg/dL — ABNORMAL HIGH (ref 65–99)
Glucose-Capillary: 242 mg/dL — ABNORMAL HIGH (ref 65–99)

## 2014-10-12 MED ORDER — INSULIN ASPART 100 UNIT/ML ~~LOC~~ SOLN
10.0000 [IU] | Freq: Once | SUBCUTANEOUS | Status: AC
  Administered 2014-10-12: 10 [IU] via SUBCUTANEOUS
  Filled 2014-10-12: qty 1

## 2014-10-12 MED ORDER — VANCOMYCIN HCL 10 G IV SOLR
1500.0000 mg | Freq: Once | INTRAVENOUS | Status: AC
  Administered 2014-10-12: 1500 mg via INTRAVENOUS
  Filled 2014-10-12: qty 1500

## 2014-10-12 MED ORDER — ONDANSETRON HCL 4 MG/2ML IJ SOLN
4.0000 mg | Freq: Once | INTRAMUSCULAR | Status: AC
  Administered 2014-10-12: 4 mg via INTRAVENOUS
  Filled 2014-10-12: qty 2

## 2014-10-12 MED ORDER — ADULT MULTIVITAMIN W/MINERALS CH
1.0000 | ORAL_TABLET | Freq: Every day | ORAL | Status: DC
  Administered 2014-10-12 – 2014-10-18 (×5): 1 via ORAL
  Filled 2014-10-12 (×6): qty 1

## 2014-10-12 MED ORDER — HYDROMORPHONE HCL 1 MG/ML IJ SOLN
1.0000 mg | Freq: Once | INTRAMUSCULAR | Status: AC
  Administered 2014-10-12: 1 mg via INTRAVENOUS
  Filled 2014-10-12: qty 1

## 2014-10-12 MED ORDER — FOLIC ACID 1 MG PO TABS
1.0000 mg | ORAL_TABLET | Freq: Every day | ORAL | Status: DC
  Administered 2014-10-12 – 2014-10-18 (×5): 1 mg via ORAL
  Filled 2014-10-12 (×6): qty 1

## 2014-10-12 MED ORDER — VANCOMYCIN HCL IN DEXTROSE 1-5 GM/200ML-% IV SOLN
1000.0000 mg | Freq: Two times a day (BID) | INTRAVENOUS | Status: DC
  Administered 2014-10-13 – 2014-10-15 (×6): 1000 mg via INTRAVENOUS
  Filled 2014-10-12 (×10): qty 200

## 2014-10-12 MED ORDER — ONDANSETRON HCL 4 MG/2ML IJ SOLN
4.0000 mg | Freq: Four times a day (QID) | INTRAMUSCULAR | Status: DC | PRN
  Administered 2014-10-16: 4 mg via INTRAVENOUS

## 2014-10-12 MED ORDER — HYDROCODONE-ACETAMINOPHEN 5-325 MG PO TABS
1.0000 | ORAL_TABLET | ORAL | Status: DC | PRN
  Administered 2014-10-12 – 2014-10-15 (×17): 2 via ORAL
  Administered 2014-10-16: 1 via ORAL
  Administered 2014-10-16 – 2014-10-17 (×2): 2 via ORAL
  Filled 2014-10-12 (×13): qty 2
  Filled 2014-10-12 (×2): qty 1
  Filled 2014-10-12 (×7): qty 2

## 2014-10-12 MED ORDER — ENOXAPARIN SODIUM 40 MG/0.4ML ~~LOC~~ SOLN
40.0000 mg | SUBCUTANEOUS | Status: DC
  Administered 2014-10-12: 40 mg via SUBCUTANEOUS
  Filled 2014-10-12: qty 0.4

## 2014-10-12 MED ORDER — PIPERACILLIN-TAZOBACTAM 3.375 G IVPB 30 MIN
3.3750 g | INTRAVENOUS | Status: AC
  Administered 2014-10-12: 3.375 g via INTRAVENOUS
  Filled 2014-10-12: qty 50

## 2014-10-12 MED ORDER — SODIUM CHLORIDE 0.9 % IV SOLN
INTRAVENOUS | Status: AC
  Administered 2014-10-12: 19:00:00 via INTRAVENOUS

## 2014-10-12 MED ORDER — INSULIN GLARGINE 100 UNIT/ML ~~LOC~~ SOLN
10.0000 [IU] | Freq: Every day | SUBCUTANEOUS | Status: DC
  Administered 2014-10-12 – 2014-10-13 (×2): 10 [IU] via SUBCUTANEOUS
  Filled 2014-10-12 (×4): qty 0.1

## 2014-10-12 MED ORDER — THIAMINE HCL 100 MG/ML IJ SOLN
100.0000 mg | Freq: Every day | INTRAMUSCULAR | Status: DC
  Administered 2014-10-16: 100 mg via INTRAVENOUS
  Filled 2014-10-12 (×5): qty 1

## 2014-10-12 MED ORDER — GADOBENATE DIMEGLUMINE 529 MG/ML IV SOLN
14.0000 mL | Freq: Once | INTRAVENOUS | Status: AC | PRN
  Administered 2014-10-12: 14 mL via INTRAVENOUS

## 2014-10-12 MED ORDER — SODIUM CHLORIDE 0.9 % IV BOLUS (SEPSIS)
1000.0000 mL | Freq: Once | INTRAVENOUS | Status: AC
  Administered 2014-10-12: 1000 mL via INTRAVENOUS

## 2014-10-12 MED ORDER — PIPERACILLIN-TAZOBACTAM 3.375 G IVPB
INTRAVENOUS | Status: AC
  Filled 2014-10-12: qty 100

## 2014-10-12 MED ORDER — LORAZEPAM 1 MG PO TABS: 1.0000 mg | ORAL_TABLET | Freq: Four times a day (QID) | ORAL | Status: AC | PRN

## 2014-10-12 MED ORDER — INSULIN ASPART 100 UNIT/ML ~~LOC~~ SOLN
0.0000 [IU] | Freq: Every day | SUBCUTANEOUS | Status: DC
  Administered 2014-10-12: 2 [IU] via SUBCUTANEOUS

## 2014-10-12 MED ORDER — VITAMIN B-1 100 MG PO TABS
100.0000 mg | ORAL_TABLET | Freq: Every day | ORAL | Status: DC
  Administered 2014-10-12 – 2014-10-18 (×5): 100 mg via ORAL
  Filled 2014-10-12 (×6): qty 1

## 2014-10-12 MED ORDER — LORAZEPAM 2 MG/ML IJ SOLN
1.0000 mg | Freq: Four times a day (QID) | INTRAMUSCULAR | Status: AC | PRN
  Administered 2014-10-15: 1 mg via INTRAVENOUS
  Filled 2014-10-12: qty 1

## 2014-10-12 MED ORDER — INSULIN ASPART 100 UNIT/ML ~~LOC~~ SOLN
0.0000 [IU] | Freq: Three times a day (TID) | SUBCUTANEOUS | Status: DC
  Administered 2014-10-13: 2 [IU] via SUBCUTANEOUS
  Administered 2014-10-13: 7 [IU] via SUBCUTANEOUS
  Administered 2014-10-13: 1 [IU] via SUBCUTANEOUS
  Administered 2014-10-14 (×2): 5 [IU] via SUBCUTANEOUS

## 2014-10-12 MED ORDER — PIPERACILLIN-TAZOBACTAM 3.375 G IVPB
3.3750 g | Freq: Three times a day (TID) | INTRAVENOUS | Status: DC
  Administered 2014-10-12 – 2014-10-18 (×17): 3.375 g via INTRAVENOUS
  Filled 2014-10-12 (×25): qty 50

## 2014-10-12 MED ORDER — ONDANSETRON HCL 4 MG PO TABS: 4.0000 mg | ORAL_TABLET | Freq: Four times a day (QID) | ORAL | Status: DC | PRN

## 2014-10-12 NOTE — ED Notes (Signed)
Patient here for recheck of right foot. Patient c/o right foot pain with infection. Per patient was seen here 2 days ago in ER and told he had got infection in foot after having all toes amputated on 5/21. Patient given antibiotics but states infection and swelling has gotten worse. Patient has low-grade temp.

## 2014-10-12 NOTE — H&P (Signed)
PCP:   No PCP Per Patient   Chief Complaint:  Foot red and swollen  HPI: 59 yo male homeless with IDDM, right metatarsal amputation 5/26 at [facility] comes in after falling down some stairs several days ago, injuring his right foot, had some dehiscence of wound then.  Comes in today because since then he has had some swelling and redness develop.  Pt reports "some flies got in it too", had maggots also in wound.  Denies any fevers.  No significant pain.  Pt is homeless, cant afford any of his medications not taking his insulin.  Does not have disability yet.  Amputation was done by dr duda at [facility].  Review of Systems:  Positive and negative as per HPI otherwise all other systems are negative  Past Medical History: Past Medical History  Diagnosis Date  . Diabetes mellitus without complication   . Depression    Past Surgical History  Procedure Laterality Date  . Right arm    . Cholecystectomy    . Amputation toe Right 09/03/2014    Procedure: AMPUTATION RIGHT GREAT TOE AND SECOND TOE;  Surgeon: Franky Macho Md, MD;  Location: [facility];  Service: General;  Laterality: Right;  right great and second toes  . Amputation Right 09/20/2014    Procedure: RIGHT TRANSMETATARSAL AMPUTATION;  Surgeon: Nadara Mustard, MD;  Location: [facility];  Service: Orthopedics;  Laterality: Right;    Medications: Prior to Admission medications   Medication Sig Start Date End Date Taking? Authorizing Provider  acetaminophen (TYLENOL) 325 MG tablet Take 2 tablets (650 mg total) by mouth every 6 (six) hours as needed for mild pain (or Fever >/= 101). Patient not taking: Reported on 10/04/2014 09/26/14   Alison Murray, MD  ARIPiprazole (ABILIFY) 5 MG tablet Take 1 tablet (5 mg total) by mouth 2 (two) times daily. Patient not taking: Reported on 10/04/2014 09/26/14   Alison Murray, MD  aspirin 81 MG chewable tablet Chew 1 tablet (81 mg total) by mouth daily. Patient not taking: Reported on 10/04/2014 09/26/14   Alison Murray, MD   benztropine (COGENTIN) 0.5 MG tablet Take 1 tablet (0.5 mg total) by mouth 2 (two) times daily. Patient not taking: Reported on 10/04/2014 09/26/14   Alison Murray, MD  cephALEXin (KEFLEX) 500 MG capsule Take 1 capsule (500 mg total) by mouth 4 (four) times daily. Patient not taking: Reported on 10/12/2014 10/10/14   Janne Napoleon, NP  gabapentin (NEURONTIN) 300 MG capsule Take 1 capsule (300 mg total) by mouth 3 (three) times daily. Patient not taking: Reported on 10/04/2014 09/26/14   Alison Murray, MD  HYDROcodone-acetaminophen (NORCO/VICODIN) 5-325 MG per tablet Take 1 tablet by mouth every 4 (four) hours as needed for moderate pain. Patient not taking: Reported on 10/04/2014 10/03/14   Azalia Bilis, MD  insulin aspart (NOVOLOG) 100 UNIT/ML injection Inject 5 Units into the skin 3 (three) times daily with meals. Patient not taking: Reported on 10/04/2014 09/26/14   Alison Murray, MD  insulin glargine (LANTUS) 100 UNIT/ML injection Inject 0.2 mLs (20 Units total) into the skin at bedtime. Patient not taking: Reported on 10/10/2014 09/26/14   Alison Murray, MD  Multiple Vitamin (MULTIVITAMIN WITH MINERALS) TABS tablet Take 1 tablet by mouth daily. Patient not taking: Reported on 09/15/2014 04/18/14   Thermon Leyland, NP  saccharomyces boulardii (FLORASTOR) 250 MG capsule Take 1 capsule (250 mg total) by mouth 2 (two) times daily. Patient not taking: Reported  on 10/04/2014 09/26/14   Alison Murray, MD  senna-docusate (SENOKOT-S) 8.6-50 MG per tablet Take 1 tablet by mouth at bedtime as needed for mild constipation. Patient not taking: Reported on 10/04/2014 09/26/14   Alison Murray, MD  sertraline (ZOLOFT) 100 MG tablet Take 1 tablet (100 mg total) by mouth daily. Patient not taking: Reported on 10/10/2014 09/26/14   Alison Murray, MD    Allergies:  No Known Allergies  Social History:  reports that he has been smoking Cigarettes.  He has a 17.5 pack-year smoking history. He has never used smokeless tobacco. He reports that  he drinks alcohol. He reports that he does not use illicit drugs.  Family History: Family History  Problem Relation Age of Onset  . Diabetes Mother   . Depression Father   . Depression Brother   . Diabetes Other   . Depression Other     Physical Exam: Filed Vitals:   10/12/14 1310  BP: 113/71  Pulse: 113  Temp: 99.3 F (37.4 C)  TempSrc: Oral  Resp: 18  Height:  (1.778 m)  Weight: 77.111 kg (170 lb)  SpO2: 100%   General appearance: alert, cooperative and no distress Head: Normocephalic, without obvious abnormality, atraumatic Eyes: negative Nose: Nares normal. Septum midline. Mucosa normal. No drainage or sinus tenderness. Neck: no JVD and supple, symmetrical, trachea midline Lungs: clear to auscultation bilaterally Heart: regular rate and rhythm, S1, S2 normal, no murmur, click, rub or gallop Abdomen: soft, non-tender; bowel sounds normal; no masses,  no organomegaly Extremities: extremities normal, atraumatic, no cyanosis or edema x right foot wound dehisced, no nectrotic tissue note but with surrounding erythema and swelling c/w cellulitis Pulses: 2+ and symmetric Skin: Skin color, texture, turgor normal. No rashes or lesions Neurologic: Grossly normal    Labs on Admission:   Recent Labs  10/12/14 1359  NA 131*  K 4.8  CL 96*  CO2 25  GLUCOSE 526*  BUN 14  CREATININE 1.27*  CALCIUM 8.5*    Recent Labs  10/12/14 1359  WBC 12.6*  NEUTROABS 10.1*  HGB 10.1*  HCT 30.4*  MCV 95.9  PLT 360   Radiological Exams on Admission: Dg Foot Complete Right  10/12/2014   CLINICAL DATA:  Right foot amputation 09/12/2014. Pain. Erythema. Using.  EXAM: RIGHT FOOT COMPLETE - 3+ VIEW  COMPARISON:  10/10/2014  FINDINGS: There is irregularity of the soft tissues along the amputation bed. There is some gas penetration along the soft tissues, similar to 10/10/2014. The foot is amputated at the proximal metatarsal metaphyseal level.  Soft tissue swelling along the  stump and midfoot. No obvious osteolysis along the metatarsal bases.  IMPRESSION: 1. Speckled gas density centrally along the amputation bed extending back towards the metatarsal bases, question breakdown of the amputation bed flap. Localized soft tissue infection is not excluded. There is considerable soft tissue swelling in the midfoot and along the amputation bed. No bony destruction.   Electronically Signed   By: Gaylyn Rong M.D.   On: 10/12/2014 14:22   Dg Foot Complete Right  10/10/2014   CLINICAL DATA:  Recent metatarsal amputation. Fell. Pain and swelling.  EXAM: RIGHT FOOT COMPLETE - 3+ VIEW  COMPARISON:  10/01/2014  FINDINGS: Stable surgical changes from a proximal metatarsal midfoot amputation. There is air in the soft tissues which was not present on the prior film. This could be due to disruption of the wound. I do not see any definite findings for acute fracture or  osteomyelitis.  IMPRESSION: New areas noted in the soft tissues of the foot overlying the amputation. This could be due to disruption of the wound or wound dehiscence.  Stable appearance at the amputation site. No fracture or evidence of osteomyelitis.   Electronically Signed   By: Rudie Meyer M.D.   On: 10/10/2014 22:36   Old record reviewed Case discussed with edp dr Blinda Leatherwood  Assessment/Plan  59 yo male homeless with uncontrolled diabetes and infection to right foot s/p recent amputation for osteomyelitis  Principal Problem:   Wound infection-  Place on iv vanc and zosyn.  Pt is high risk for needing further amputation due to noncompliance with medications due to cost issues.  Order mri of foot to evaluate if infection is deeper.  Obtain general surgery consult in am.     Active Problems:   Type 2 diabetes mellitus with peripheral neuropathy-  SSI, resume lantus however pt cannot afford any meds as outpatient   Nonpsychotic mental disorder- stable   GAD (generalized anxiety disorder)- stable   Alcohol use  disorder, severe, dependence - place on ciwa protocol, prn ativan ordered.  bananna bag.  Admit to medical bed.  Full code.  DAVID,RACHAL A 10/12/2014, 4:32 PM

## 2014-10-12 NOTE — Progress Notes (Signed)
ANTIBIOTIC CONSULT NOTE - INITIAL  Pharmacy Consult for Vancomycin and Zosyn Indication: wound infection  No Known Allergies  Patient Measurements: Height:  (177.8 cm) Weight: 170 lb (77.111 kg) IBW/kg (Calculated) : 73  Vital Signs: Temp: 99.3 F (37.4 C) (06/17 1310) Temp Source: Oral (06/17 1310) BP: 113/71 mmHg (06/17 1310) Pulse Rate: 113 (06/17 1310) Intake/Output from previous day:   Intake/Output from this shift:    Labs:  Recent Labs  10/12/14 1359  WBC 12.6*  HGB 10.1*  PLT 360  CREATININE 1.27*   Estimated Creatinine Clearance: 66.3 mL/min (by C-G formula based on Cr of 1.27). No results for input(s): VANCOTROUGH, VANCOPEAK, VANCORANDOM, GENTTROUGH, GENTPEAK, GENTRANDOM, TOBRATROUGH, TOBRAPEAK, TOBRARND, AMIKACINPEAK, AMIKACINTROU, AMIKACIN in the last 72 hours.   Microbiology: Recent Results (from the past 720 hour(s))  Surgical pcr screen     Status: None   Collection Time: 09/20/14  4:43 AM  Result Value Ref Range Status   MRSA, PCR NEGATIVE NEGATIVE Final   Staphylococcus aureus NEGATIVE NEGATIVE Final    Comment:        The Xpert SA Assay (FDA approved for NASAL specimens in patients over 53 years of age), is one component of a comprehensive surveillance program.  Test performance has been validated by [facility] for patients greater than or equal to 52 year old. It is not intended to diagnose infection nor to guide or monitor treatment.   Culture, blood (routine x 2)     Status: None   Collection Time: 09/25/14  7:50 AM  Result Value Ref Range Status   Specimen Description BLOOD RIGHT ARM  Final   Special Requests BOTTLES DRAWN AEROBIC ONLY 5CC  Final   Culture   Final    NO GROWTH 5 DAYS Performed at [facility]    Report Status 10/01/2014 FINAL  Final  Culture, blood (routine x 2)     Status: None   Collection Time: 09/25/14  8:00 AM  Result Value Ref Range Status   Specimen Description BLOOD RIGHT ARM  Final   Special Requests BOTTLES DRAWN AEROBIC ONLY 5CC  Final   Culture   Final    NO GROWTH 5 DAYS Performed at [facility]    Report Status 10/01/2014 FINAL  Final  Culture, blood (routine x 2)     Status: None (Preliminary result)   Collection Time: 10/12/14  2:04 PM  Result Value Ref Range Status   Specimen Description BLOOD LEFT ARM  Final   Special Requests Normal 10CC EACH BOTTLE  Final   Culture PENDING  Incomplete   Report Status PENDING  Incomplete  Culture, blood (routine x 2)     Status: None (Preliminary result)   Collection Time: 10/12/14  2:08 PM  Result Value Ref Range Status   Specimen Description BLOOD RIGHT ARM  Final   Special Requests   Final    Normal BOTTLES DRAWN AEROBIC AND ANAEROBIC 9 CC EACH BOTTLE   Culture PENDING  Incomplete   Report Status PENDING  Incomplete   Medical History: Past Medical History  Diagnosis Date  . Diabetes mellitus without complication   . Depression    Assessment: 59yo male with worsening pain and swelling of right foot.  Pt is s/p partial amputation in May. SCr is slightly elevated.   Goal of Therapy:  Vancomycin trough level ~ 15 Eradicate infection.  Plan:  Zosyn 3.375gm IV q8h, each dose over 4 hrs Vancomycin  IV now x 1 then Vancomycin  1000mg  IV q12hrs Check trough at steady state Monitor labs, renal fxn, and c/s Deescalate ABX when appropriate  Valrie Hart A 10/12/2014,3:32 PM

## 2014-10-12 NOTE — ED Provider Notes (Addendum)
CSN: [id number]     Arrival date & time 10/12/14  1305 History   First MD Initiated Contact with Patient 10/12/14 1338     Chief Complaint  Patient presents with  . Wound Check     (Consider location/radiation/quality/duration/timing/severity/associated sxs/prior Treatment) HPI Comments: Patient presents to the ER for evaluation of right foot pain, swelling. Patient reports that he had toes have dictated earlier this year, developed further infection and had amputation through the midportion of his foot and of May. He reports that he slipped several days ago and fell, tearing the stitches out of the operative site. Since then he has noticed a foul odor coming from the wound, pain, swelling, redness. Today he noticed maggots in the wound.  Patient is a 59 y.o. [place] presenting with wound check.  Wound Check    Past Medical History  Diagnosis Date  . Diabetes mellitus without complication   . Depression    Past Surgical History  Procedure Laterality Date  . Right arm    . Cholecystectomy    . Amputation toe Right 09/03/2014    Procedure: AMPUTATION RIGHT GREAT TOE AND SECOND TOE;  Surgeon: Franky Macho Md, MD;  Location: [facility];  Service: General;  Laterality: Right;  right great and second toes  . Amputation Right 09/20/2014    Procedure: RIGHT TRANSMETATARSAL AMPUTATION;  Surgeon: Nadara Mustard, MD;  Location: [facility];  Service: Orthopedics;  Laterality: Right;   Family History  Problem Relation Age of Onset  . Diabetes Mother   . Depression Father   . Depression Brother   . Diabetes Other   . Depression Other    History  Substance Use Topics  . Smoking status: Current Every Day Smoker -- 0.50 packs/day for 35 years    Types: Cigarettes  . Smokeless tobacco: Never Used  . Alcohol Use: Yes     Comment: once per month    Review of Systems  Skin: Positive for wound.  All other systems reviewed and are negative.     Allergies  Review of patient's allergies indicates no  known allergies.  Home Medications   Prior to Admission medications   Medication Sig Start Date End Date Taking? Authorizing Provider  acetaminophen (TYLENOL) 325 MG tablet Take 2 tablets (650 mg total) by mouth every 6 (six) hours as needed for mild pain (or Fever >/= 101). Patient not taking: Reported on 10/04/2014 09/26/14   Alison Murray, MD  ARIPiprazole (ABILIFY) 5 MG tablet Take 1 tablet (5 mg total) by mouth 2 (two) times daily. Patient not taking: Reported on 10/04/2014 09/26/14   Alison Murray, MD  aspirin 81 MG chewable tablet Chew 1 tablet (81 mg total) by mouth daily. Patient not taking: Reported on 10/04/2014 09/26/14   Alison Murray, MD  benztropine (COGENTIN) 0.5 MG tablet Take 1 tablet (0.5 mg total) by mouth 2 (two) times daily. Patient not taking: Reported on 10/04/2014 09/26/14   Alison Murray, MD  cephALEXin (KEFLEX) 500 MG capsule Take 1 capsule (500 mg total) by mouth 4 (four) times daily. Patient not taking: Reported on 10/12/2014 10/10/14   Janne Napoleon, NP  gabapentin (NEURONTIN) 300 MG capsule Take 1 capsule (300 mg total) by mouth 3 (three) times daily. Patient not taking: Reported on 10/04/2014 09/26/14   Alison Murray, MD  HYDROcodone-acetaminophen (NORCO/VICODIN) 5-325 MG per tablet Take 1 tablet by mouth every 4 (four) hours as needed for moderate pain. Patient not taking: Reported on 10/04/2014  10/03/14   Azalia Bilis, MD  insulin aspart (NOVOLOG) 100 UNIT/ML injection Inject 5 Units into the skin 3 (three) times daily with meals. Patient not taking: Reported on 10/04/2014 09/26/14   Alison Murray, MD  insulin glargine (LANTUS) 100 UNIT/ML injection Inject 0.2 mLs (20 Units total) into the skin at bedtime. Patient not taking: Reported on 10/10/2014 09/26/14   Alison Murray, MD  Multiple Vitamin (MULTIVITAMIN WITH MINERALS) TABS tablet Take 1 tablet by mouth daily. Patient not taking: Reported on 09/15/2014 04/18/14   Thermon Leyland, NP  saccharomyces boulardii (FLORASTOR) 250 MG capsule Take  1 capsule (250 mg total) by mouth 2 (two) times daily. Patient not taking: Reported on 10/04/2014 09/26/14   Alison Murray, MD  senna-docusate (SENOKOT-S) 8.6-50 MG per tablet Take 1 tablet by mouth at bedtime as needed for mild constipation. Patient not taking: Reported on 10/04/2014 09/26/14   Alison Murray, MD  sertraline (ZOLOFT) 100 MG tablet Take 1 tablet (100 mg total) by mouth daily. Patient not taking: Reported on 10/10/2014 09/26/14   Alison Murray, MD   BP 113/71 mmHg  Pulse 113  Temp(Src) 99.3 F (37.4 C) (Oral)  Resp 18  Ht  (1.778 m)  Wt 170 lb (77.111 kg)  BMI 24.39 kg/m2  SpO2 100% Physical Exam  Constitutional: He is oriented to person, place, and time. He appears well-developed and well-nourished. No distress.  HENT:  Head: Normocephalic and atraumatic.  Right Ear: Hearing normal.  Left Ear: Hearing normal.  Nose: Nose normal.  Mouth/Throat: Oropharynx is clear and moist and mucous membranes are normal.  Eyes: Conjunctivae and EOM are normal. Pupils are equal, round, and reactive to light.  Neck: Normal range of motion. Neck supple.  Cardiovascular: Regular rhythm, S1 normal and S2 normal.  Exam reveals no gallop and no friction rub.   No murmur heard. Pulmonary/Chest: Effort normal and breath sounds normal. No respiratory distress. He exhibits no tenderness.  Abdominal: Soft. Normal appearance and bowel sounds are normal. There is no hepatosplenomegaly. There is no tenderness. There is no rebound, no guarding, no tenderness at McBurney's point and negative Murphy's sign. No hernia.  Musculoskeletal: Normal range of motion.       Right foot: There is tenderness.       Feet:  Neurological: He is alert and oriented to person, place, and time. He has normal strength. No cranial nerve deficit or sensory deficit. Coordination normal. GCS eye subscore is 4. GCS verbal subscore is 5. GCS motor subscore is 6.  Skin: Skin is warm, dry and intact. No rash noted. No cyanosis.      Psychiatric: He has a normal mood and affect. His speech is normal and behavior is normal. Thought content normal.  Nursing note and vitals reviewed.     ED Course  Procedures (including critical care time) Labs Review Labs Reviewed  CBC WITH DIFFERENTIAL/PLATELET - Abnormal; Notable for the following:    WBC 12.6 (*)    RBC 3.17 (*)    Hemoglobin 10.1 (*)    HCT 30.4 (*)    Neutrophils Relative % 80 (*)    Neutro Abs 10.1 (*)    All other components within normal limits  BASIC METABOLIC PANEL - Abnormal; Notable for the following:    Sodium 131 (*)    Chloride 96 (*)    Glucose, Bld 526 (*)    Creatinine, Ser 1.27 (*)    Calcium 8.5 (*)    All  other components within normal limits  I-STAT CG4 LACTIC ACID, ED - Abnormal; Notable for the following:    Lactic Acid, Venous 2.07 (*)    All other components within normal limits  CULTURE, BLOOD (ROUTINE X 2)  CULTURE, BLOOD (ROUTINE X 2)  WOUND CULTURE  CBG MONITORING, ED    Imaging Review Dg Foot Complete Right  10/12/2014   CLINICAL DATA:  Right foot amputation 09/12/2014. Pain. Erythema. Using.  EXAM: RIGHT FOOT COMPLETE - 3+ VIEW  COMPARISON:  10/10/2014  FINDINGS: There is irregularity of the soft tissues along the amputation bed. There is some gas penetration along the soft tissues, similar to 10/10/2014. The foot is amputated at the proximal metatarsal metaphyseal level.  Soft tissue swelling along the stump and midfoot. No obvious osteolysis along the metatarsal bases.  IMPRESSION: 1. Speckled gas density centrally along the amputation bed extending back towards the metatarsal bases, question breakdown of the amputation bed flap. Localized soft tissue infection is not excluded. There is considerable soft tissue swelling in the midfoot and along the amputation bed. No bony destruction.   Electronically Signed   By: Gaylyn Rong M.D.   On: 10/12/2014 14:22   Dg Foot Complete Right  10/10/2014   CLINICAL DATA:   Recent metatarsal amputation. Fell. Pain and swelling.  EXAM: RIGHT FOOT COMPLETE - 3+ VIEW  COMPARISON:  10/01/2014  FINDINGS: Stable surgical changes from a proximal metatarsal midfoot amputation. There is air in the soft tissues which was not present on the prior film. This could be due to disruption of the wound. I do not see any definite findings for acute fracture or osteomyelitis.  IMPRESSION: New areas noted in the soft tissues of the foot overlying the amputation. This could be due to disruption of the wound or wound dehiscence.  Stable appearance at the amputation site. No fracture or evidence of osteomyelitis.   Electronically Signed   By: Rudie Meyer M.D.   On: 10/10/2014 22:36     EKG Interpretation None      MDM   Final diagnoses:  None   cellulitis   wound infection  Patient presents to the ER for evaluation of infection of the area of previous amputation. Patient recently had an injury to the site and had the essence of the sutures. He returns today with swelling, redness, drainage and maggots in the wound. There is clear evidence of infection with significant malodorous drainage. Patient will require hospitalization for antibiotic coverage. The is tachycardic at arrival, likely secondary to pain. He is not hypotensive and there is no clear fever. Has a mild leukocytosis, but not clearly septic at this point.  Patient is a diabetic and once again sugars are uncontrolled above 500. He does not appear to be in DKA.  Patient will require hospitalization for IV antibiotic and treatment of wound infection as well as management of blood sugars.    Gilda Crease, MD 10/12/14 1458  Gilda Crease, MD 10/12/14 [phone]

## 2014-10-13 DIAGNOSIS — T798XXD Other early complications of trauma, subsequent encounter: Secondary | ICD-10-CM

## 2014-10-13 LAB — GLUCOSE, CAPILLARY
GLUCOSE-CAPILLARY: 123 mg/dL — AB (ref 65–99)
Glucose-Capillary: 170 mg/dL — ABNORMAL HIGH (ref 65–99)
Glucose-Capillary: 305 mg/dL — ABNORMAL HIGH (ref 65–99)
Glucose-Capillary: 178 mg/dL — ABNORMAL HIGH (ref 65–99)

## 2014-10-13 LAB — CBC
HCT: 28.8 % — ABNORMAL LOW (ref 39.0–52.0)
Hemoglobin: 9.4 g/dL — ABNORMAL LOW (ref 13.0–17.0)
MCH: 31.3 pg (ref 26.0–34.0)
MCHC: 32.6 g/dL (ref 30.0–36.0)
MCV: 96 fL (ref 78.0–100.0)
Platelets: 303 10*3/uL (ref 150–400)
RBC: 3 MIL/uL — ABNORMAL LOW (ref 4.22–5.81)
RDW: 14.2 % (ref 11.5–15.5)
WBC: 10.1 10*3/uL (ref 4.0–10.5)

## 2014-10-13 LAB — BASIC METABOLIC PANEL
Anion gap: 6 (ref 5–15)
BUN: 16 mg/dL (ref 6–20)
CALCIUM: 8.4 mg/dL — AB (ref 8.9–10.3)
CO2: 26 mmol/L (ref 22–32)
CREATININE: 1.06 mg/dL (ref 0.61–1.24)
Chloride: 105 mmol/L (ref 101–111)
GFR calc non Af Amer: >60 mL/min (ref >60)
Glucose, Bld: 162 mg/dL — ABNORMAL HIGH (ref 65–99)
Potassium: 4.1 mmol/L (ref 3.5–5.1)
Sodium: 137 mmol/L (ref 135–145)

## 2014-10-13 MED ORDER — MORPHINE SULFATE 2 MG/ML IJ SOLN
1.0000 mg | INTRAMUSCULAR | Status: DC | PRN
  Administered 2014-10-13 – 2014-10-14 (×6): 1 mg via INTRAVENOUS
  Filled 2014-10-13 (×6): qty 1

## 2014-10-13 MED ORDER — HEPARIN SODIUM (PORCINE) 5000 UNIT/ML IJ SOLN
5000.0000 [IU] | Freq: Three times a day (TID) | INTRAMUSCULAR | Status: DC
  Administered 2014-10-13 – 2014-10-18 (×16): 5000 [IU] via SUBCUTANEOUS
  Filled 2014-10-13 (×20): qty 1

## 2014-10-13 MED ORDER — VANCOMYCIN HCL IN DEXTROSE 1-5 GM/200ML-% IV SOLN
INTRAVENOUS | Status: AC
  Filled 2014-10-13: qty 200

## 2014-10-13 NOTE — Progress Notes (Addendum)
Patient transferring to [facility] via [facility]. Report given to [facility] transporter. VSS. Patient's own walker sent with him. Called report to [place] on [place] and [facility].

## 2014-10-13 NOTE — Consult Note (Addendum)
Reason for Consult:Right foot transmetatarsal amputation wound dehiscence. Referring Physician: Otilio [facility] [employer] [place] of right transmetatarsal amputation. Poor compliance. Social concerns, homeless, no source of funding of Skilled nursing facility.   ASN:[id number] Allbaugh is an 59 y.o. male with history of diabetes mellitus. Status post right foot second toe amputation by Dr. Arnoldo Morale, general surgery At [facility], then went on to non healing of the right forefoot and had revision of the toe amputation to a right transmetatarsal Amputation by Dr. Sharol Given 09/20/2014. Presented to [facility]  And was advised to follow up with Dr. Sharol Given and was seen in Dr. Jess Barters office 10/08/2014 With slight dehiscence of the right foot wound. Advised to start silvadiene dressings and to stay of the right foot. On 10/09/2014 he apparently fell landing On the right foot and the foot incision opened. Presented to [facility] last evening and was admitted. Right foot incision is dehisced and there Were maggots on the wound. Dr. Jerilee Hoh of [facility] spoke with me this AM and I recommended transfer so he may be evaluated And revision surgery considered. Currently on IV antibiotics.    Past Medical History  Diagnosis Date  . Diabetes mellitus without complication   . Depression     Past Surgical History  Procedure Laterality Date  . Right arm    . Cholecystectomy    . Amputation toe Right 09/03/2014    Procedure: AMPUTATION RIGHT GREAT TOE AND SECOND TOE;  Surgeon: Aviva Signs Md, MD;  Location: [facility];  Service: General;  Laterality: Right;  right great and second toes  . Amputation Right 09/20/2014    Procedure: RIGHT TRANSMETATARSAL AMPUTATION;  Surgeon: Newt Minion, MD;  Location: [facility];  Service: Orthopedics;  Laterality: Right;    Family History  Problem Relation Age of Onset  . Diabetes Mother   . Depression  Father   . Depression Brother   . Diabetes Other   . Depression Other     Social History:  reports that he has been smoking Cigarettes.  He has a 17.5 pack-year smoking history. He has never used smokeless tobacco. He reports that he drinks alcohol. He reports that he does not use illicit drugs.  Allergies: No Known Allergies  Medications:  Prior to Admission:  Prescriptions prior to admission  Medication Sig Dispense Refill Last Dose  . acetaminophen (TYLENOL) 325 MG tablet Take 2 tablets (650 mg total) by mouth every 6 (six) hours as needed for mild pain (or Fever >/= 101). (Patient not taking: Reported on 10/04/2014) 30 tablet 0 Not Taking at Unknown time  . ARIPiprazole (ABILIFY) 5 MG tablet Take 1 tablet (5 mg total) by mouth 2 (two) times daily. (Patient not taking: Reported on 10/04/2014) 60 tablet 0 Not Taking at Unknown time  . aspirin 81 MG chewable tablet Chew 1 tablet (81 mg total) by mouth daily. (Patient not taking: Reported on 10/04/2014) 30 tablet 0 Not Taking at Unknown time  . benztropine (COGENTIN) 0.5 MG tablet Take 1 tablet (0.5 mg total) by mouth 2 (two) times daily. (Patient not taking: Reported on 10/04/2014) 60 tablet 0 Not Taking at Unknown time  . cephALEXin (KEFLEX) 500 MG capsule Take 1 capsule (500 mg total) by mouth 4 (four) times daily. (Patient not taking: Reported on 10/12/2014) 20 capsule 0 Not Taking at Unknown time  . gabapentin (NEURONTIN) 300 MG capsule Take 1 capsule (300 mg total) by mouth 3 (three) times  daily. (Patient not taking: Reported on 10/04/2014) 90 capsule 0 Not Taking at Unknown time  . HYDROcodone-acetaminophen (NORCO/VICODIN) 5-325 MG per tablet Take 1 tablet by mouth every 4 (four) hours as needed for moderate pain. (Patient not taking: Reported on 10/04/2014) 8 tablet 0 Not Taking at Unknown time  . insulin aspart (NOVOLOG) 100 UNIT/ML injection Inject 5 Units into the skin 3 (three) times daily with meals. (Patient not taking: Reported on 10/04/2014) 10  mL 11 Not Taking at Unknown time  . insulin glargine (LANTUS) 100 UNIT/ML injection Inject 0.2 mLs (20 Units total) into the skin at bedtime. (Patient not taking: Reported on 10/10/2014) 10 mL 11 Past Week at Unknown time  . Multiple Vitamin (MULTIVITAMIN WITH MINERALS) TABS tablet Take 1 tablet by mouth daily. (Patient not taking: Reported on 09/15/2014)   Not Taking at Unknown time  . saccharomyces boulardii (FLORASTOR) 250 MG capsule Take 1 capsule (250 mg total) by mouth 2 (two) times daily. (Patient not taking: Reported on 10/04/2014) 60 capsule 0 Not Taking at Unknown time  . senna-docusate (SENOKOT-S) 8.6-50 MG per tablet Take 1 tablet by mouth at bedtime as needed for mild constipation. (Patient not taking: Reported on 10/04/2014) 10 tablet 0 Not Taking at Unknown time  . sertraline (ZOLOFT) 100 MG tablet Take 1 tablet (100 mg total) by mouth daily. (Patient not taking: Reported on 10/10/2014) 30 tablet 0 Past Week at Unknown time   Scheduled: . folic acid  1 mg Oral Daily  . heparin subcutaneous  5,000 Units Subcutaneous 3 times per day  . insulin aspart  0-5 Units Subcutaneous QHS  . insulin aspart  0-9 Units Subcutaneous TID WC  . insulin glargine  10 Units Subcutaneous QHS  . multivitamin with minerals  1 tablet Oral Daily  . piperacillin-tazobactam (ZOSYN)  IV  3.375 g Intravenous Q8H  . thiamine  100 mg Oral Daily   Or  . thiamine  100 mg Intravenous Daily  . vancomycin  1,000 mg Intravenous Q12H    Results for orders placed or performed during the hospital encounter of 10/12/14 (from the past 48 hour(s))  Glucose, capillary     Status: Abnormal   Collection Time: 10/12/14  1:17 PM  Result Value Ref Range   Glucose-Capillary 478 (H) 65 - 99 mg/dL  CBC with Differential/Platelet     Status: Abnormal   Collection Time: 10/12/14  1:59 PM  Result Value Ref Range   WBC 12.6 (H) 4.0 - 10.5 K/uL   RBC 3.17 (L) 4.22 - 5.81 MIL/uL   Hemoglobin 10.1 (L) 13.0 - 17.0 g/dL   HCT 30.4 (L)  39.0 - 52.0 %   MCV 95.9 78.0 - 100.0 fL   MCH 31.9 26.0 - 34.0 pg   MCHC 33.2 30.0 - 36.0 g/dL   RDW 13.9 11.5 - 15.5 %   Platelets 360 150 - 400 K/uL   Neutrophils Relative % 80 (H) 43 - 77 %   Neutro Abs 10.1 (H) 1.7 - 7.7 K/uL   Lymphocytes Relative 13 12 - 46 %   Lymphs Abs 1.6 0.7 - 4.0 K/uL   Monocytes Relative 6 3 - 12 %   Monocytes Absolute 0.8 0.1 - 1.0 K/uL   Eosinophils Relative 0 0 - 5 %   Eosinophils Absolute 0.1 0.0 - 0.7 K/uL   Basophils Relative 1 0 - 1 %   Basophils Absolute 0.1 0.0 - 0.1 K/uL  Basic metabolic panel     Status: Abnormal  Collection Time: 10/12/14  1:59 PM  Result Value Ref Range   Sodium 131 (L) 135 - 145 mmol/L   Potassium 4.8 3.5 - 5.1 mmol/L   Chloride 96 (L) 101 - 111 mmol/L   CO2 25 22 - 32 mmol/L   Glucose, Bld 526 (H) 65 - 99 mg/dL   BUN 14 6 - 20 mg/dL   Creatinine, Ser 1.27 (H) 0.61 - 1.24 mg/dL   Calcium 8.5 (L) 8.9 - 10.3 mg/dL   GFR calc non Af Amer >60 >60 mL/min   GFR calc Af Amer >60 >60 mL/min    Comment: (NOTE) The eGFR has been calculated using the CKD EPI equation. This calculation has not been validated in all clinical situations. eGFR's persistently <60 mL/min signify possible Chronic Kidney Disease.    Anion gap 10 5 - 15  Ethanol     Status: None   Collection Time: 10/12/14  1:59 PM  Result Value Ref Range   Alcohol, Ethyl (B) <5 <5 mg/dL    Comment:        LOWEST DETECTABLE LIMIT FOR SERUM ALCOHOL IS 5 mg/dL FOR MEDICAL PURPOSES ONLY   Culture, blood (routine x 2)     Status: None (Preliminary result)   Collection Time: 10/12/14  2:04 PM  Result Value Ref Range   Specimen Description BLOOD LEFT ARM    Special Requests Normal 10CC EACH BOTTLE    Culture PENDING    Report Status PENDING   Culture, blood (routine x 2)     Status: None (Preliminary result)   Collection Time: 10/12/14  2:08 PM  Result Value Ref Range   Specimen Description BLOOD RIGHT ARM    Special Requests      Normal BOTTLES DRAWN  AEROBIC AND ANAEROBIC 9 CC EACH BOTTLE   Culture PENDING    Report Status PENDING   I-Stat CG4 Lactic Acid, ED     Status: Abnormal   Collection Time: 10/12/14  2:39 PM  Result Value Ref Range   Lactic Acid, Venous 2.07 (HH) 0.5 - 2.0 mmol/L   Comment NOTIFIED PHYSICIAN   Urinalysis, Routine w reflex microscopic (not at [facility])     Status: Abnormal   Collection Time: 10/12/14  5:25 PM  Result Value Ref Range   Color, Urine YELLOW YELLOW   APPearance CLEAR CLEAR   Specific Gravity, Urine 1.020 1.005 - 1.030   pH 6.0 5.0 - 8.0   Glucose, UA >1000 (A) NEGATIVE mg/dL   Hgb urine dipstick TRACE (A) NEGATIVE   Bilirubin Urine NEGATIVE NEGATIVE   Ketones, ur NEGATIVE NEGATIVE mg/dL   Protein, ur TRACE (A) NEGATIVE mg/dL   Urobilinogen, UA 0.2 0.0 - 1.0 mg/dL   Nitrite NEGATIVE NEGATIVE   Leukocytes, UA NEGATIVE NEGATIVE  Urine rapid drug screen (hosp performed)     Status: Abnormal   Collection Time: 10/12/14  5:25 PM  Result Value Ref Range   Opiates POSITIVE (A) NONE DETECTED   Cocaine NONE DETECTED NONE DETECTED   Benzodiazepines NONE DETECTED NONE DETECTED   Amphetamines NONE DETECTED NONE DETECTED   Tetrahydrocannabinol NONE DETECTED NONE DETECTED   Barbiturates NONE DETECTED NONE DETECTED    Comment:        DRUG SCREEN FOR MEDICAL PURPOSES ONLY.  IF CONFIRMATION IS NEEDED FOR ANY PURPOSE, NOTIFY LAB WITHIN 5 DAYS.        LOWEST DETECTABLE LIMITS FOR URINE DRUG SCREEN Drug Class       Cutoff (ng/mL) Amphetamine  1000 Barbiturate      200 Benzodiazepine   170 Tricyclics       017 Opiates          300 Cocaine          300 THC              50   Urine microscopic-add on     Status: None   Collection Time: 10/12/14  5:25 PM  Result Value Ref Range   Squamous Epithelial / LPF RARE RARE   WBC, UA 0-2 <3 WBC/hpf   RBC / HPF 3-6 <3 RBC/hpf   Bacteria, UA RARE RARE  Glucose, capillary     Status: Abnormal   Collection Time: 10/12/14 10:15 PM  Result Value Ref Range    Glucose-Capillary 242 (H) 65 - 99 mg/dL  Basic metabolic panel     Status: Abnormal   Collection Time: 10/13/14  6:31 AM  Result Value Ref Range   Sodium 137 135 - 145 mmol/L   Potassium 4.1 3.5 - 5.1 mmol/L   Chloride 105 101 - 111 mmol/L   CO2 26 22 - 32 mmol/L   Glucose, Bld 162 (H) 65 - 99 mg/dL   BUN 16 6 - 20 mg/dL   Creatinine, Ser 1.06 0.61 - 1.24 mg/dL   Calcium 8.4 (L) 8.9 - 10.3 mg/dL   GFR calc non Af Amer >60 >60 mL/min   GFR calc Af Amer >60 >60 mL/min    Comment: (NOTE) The eGFR has been calculated using the CKD EPI equation. This calculation has not been validated in all clinical situations. eGFR's persistently <60 mL/min signify possible Chronic Kidney Disease.    Anion gap 6 5 - 15  CBC     Status: Abnormal   Collection Time: 10/13/14  6:31 AM  Result Value Ref Range   WBC 10.1 4.0 - 10.5 K/uL   RBC 3.00 (L) 4.22 - 5.81 MIL/uL   Hemoglobin 9.4 (L) 13.0 - 17.0 g/dL   HCT 28.8 (L) 39.0 - 52.0 %   MCV 96.0 78.0 - 100.0 fL   MCH 31.3 26.0 - 34.0 pg   MCHC 32.6 30.0 - 36.0 g/dL   RDW 14.2 11.5 - 15.5 %   Platelets 303 150 - 400 K/uL  Glucose, capillary     Status: Abnormal   Collection Time: 10/13/14  8:16 AM  Result Value Ref Range   Glucose-Capillary 170 (H) 65 - 99 mg/dL   Comment 1 Notify RN   Glucose, capillary     Status: Abnormal   Collection Time: 10/13/14 11:35 AM  Result Value Ref Range   Glucose-Capillary 123 (H) 65 - 99 mg/dL  Glucose, capillary     Status: Abnormal   Collection Time: 10/13/14  4:47 PM  Result Value Ref Range   Glucose-Capillary 305 (H) 65 - 99 mg/dL    Mr Foot Right W Wo Contrast  10/13/2014   CLINICAL DATA:  Diabetic patient status post amputation at the bases of the metatarsals of the right foot 09/20/2014. Status post fall several days ago with a right foot injury and wound dehiscence. Redness, pain and swelling.  EXAM: MRI OF THE RIGHT FOOT WITHOUT AND WITH CONTRAST  TECHNIQUE: Multiplanar, multisequence MR imaging was  performed both before and after administration of intravenous contrast.  CONTRAST:  14 mL MULTIHANCE GADOBENATE DIMEGLUMINE 529 MG/ML IV SOLN  COMPARISON:  Plain films of the right foot 10/01/2014, 10/10/2014 and 10/12/2014.  FINDINGS: Postoperative change of amputation at the  level the bases of all metatarsals is identified. There is a large open wound at the surgical site eccentric medially. The wound is centered over the remaining first, second and third metatarsals. In the soft tissues deep to the lateral margin of the wound, a skin ulceration is seen along the lateral margin of the remaining fifth metatarsal with locules of gas in soft tissues deep to the ulceration. There is a thin collection of fluid versus granulation tissue tracking deep to this wound laterally at the level of the fifth metatarsal.  Intense marrow edema and enhancement are seen throughout all of the metatarsal remnants. Less intense marrow edema and enhancement are also seen in the periphery of the cuboid. A mild degree of edema and enhancement are noted in the calcaneus centered at the peroneal tubercle.  IMPRESSION: Status post amputation at the level the bases of metatarsals. Intense marrow edema and enhancement throughout all the metatarsals remnants is consistent with osteomyelitis. Milder degree of edema and enhancement in the lateral cuboid is also worrisome for osteomyelitis. Very mild edema and enhancement in the calcaneus could be secondary to reactive change or osteomyelitis.  Likely small soft tissue abscess lateral to the patient's large open wound.  Skin ulceration over the base of fifth metatarsal. Soft tissues deep to the ulceration and locules of gas.   Electronically Signed   By: Inge Rise M.D.   On: 10/13/2014 08:23   Dg Foot Complete Right  10/12/2014   CLINICAL DATA:  Right foot amputation 09/12/2014. Pain. Erythema. Using.  EXAM: RIGHT FOOT COMPLETE - 3+ VIEW  COMPARISON:  10/10/2014  FINDINGS: There is  irregularity of the soft tissues along the amputation bed. There is some gas penetration along the soft tissues, similar to 10/10/2014. The foot is amputated at the proximal metatarsal metaphyseal level.  Soft tissue swelling along the stump and midfoot. No obvious osteolysis along the metatarsal bases.  IMPRESSION: 1. Speckled gas density centrally along the amputation bed extending back towards the metatarsal bases, question breakdown of the amputation bed flap. Localized soft tissue infection is not excluded. There is considerable soft tissue swelling in the midfoot and along the amputation bed. No bony destruction.   Electronically Signed   By: Van Clines M.D.   On: 10/12/2014 14:22    Review of Systems  Constitutional: Negative for fever, chills and weight loss.  HENT: Negative for congestion, ear discharge, ear pain, hearing loss, nosebleeds, sore throat and tinnitus.   Eyes: Negative for blurred vision, double vision, photophobia, pain, discharge and redness.  Respiratory: Negative for cough, hemoptysis, sputum production, shortness of breath, wheezing and stridor.   Cardiovascular: Positive for claudication and leg swelling. Negative for chest pain, palpitations, orthopnea and PND.  Gastrointestinal: Negative for heartburn, nausea, vomiting, abdominal pain, diarrhea, constipation, blood in stool and melena.  Genitourinary: Negative for dysuria, urgency, frequency, hematuria and flank pain.  Musculoskeletal: Positive for falls. Negative for myalgias, back pain, joint pain and neck pain.  Skin: Positive for rash. Negative for itching.  Neurological: Positive for sensory change, focal weakness and weakness. Negative for dizziness, tingling, tremors, speech change, seizures, loss of consciousness and headaches.  Endo/Heme/Allergies: Negative for environmental allergies and polydipsia. Does not bruise/bleed easily.  Psychiatric/Behavioral: Negative for depression, suicidal ideas,  hallucinations, memory loss and substance abuse. The patient is not nervous/anxious and does not have insomnia.    Blood pressure 140/81, pulse 55, temperature 98.3 F (36.8 C), temperature source Oral, resp. rate 16, height 5' 10"  (1.778 m),  weight 70.035 kg (154 lb 6.4 oz), SpO2 100 %. Physical Exam  Orthopaedic Exam: This patient is awake alert and oriented 4. He is sitting upright in his bed eating a meal. The right foot that has loose stressing present and this was removed. Right foot shows sutures loose within a open wound measuring approximately 4 cm x 10 cm. There is blistering over the dorsal medial aspect of the right mid proximal foot. There is an area of desquamated skin over the plantar medial aspect of the right foot. There is a exudate to the open wound which is purulent and malodorous. A new dressing was normal saline wet to dries applied. Patient apparently has undergone previous Doppler ankle arm ratios. There is swelling of the right foot that is moderately severe. No warmth or erythema extending above the ankle level noted. MRI scan from 10/13/2014 suggests areas of fluid and edema associated with the residual metatarsals. 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  Assessment/Plan: Right foot dehisced surgical incision site of transmetatarsal amputation with infection. Diabetes mellitus type 2. Poor social situation where patient is homeless with no financial support. Unfortunately his social situation places him at high risk for difficulties healing his right lower extremity due to poor nutrition inability to rest her elevate the lower extremity and inability to maintain a nonweightbearing status.  Plan: This patient was admitted at [facility] in transfer from [facility] he will continue with IV antibiotics to try and decrease the cellulitis associated with the right lower extremity maintain the lower extremity and  elevated position. Dr. Sharol Given will see this patient and consider revision of transmetatarsal amputation to the higher level. I will continue to follow until a solution is obtained.  NITKA,JAMES E 10/13/2014, 6:05 PM

## 2014-10-13 NOTE — Progress Notes (Signed)
[facility] PROGRESS NOTE  Tyler Burgess JOI:[id number] DOB: [birth year] DOA: 10/12/2014 PCP: No PCP Per Patient  Assessment/Plan: Right foot infection -Patient is status post a recent right transmetatarsal amputation by Dr. Lajoyce Corners at the end of May. -Patient says about 4 days ago he tripped over stairs and his wound stitches opened up, subsequently draining yellow fluid. On the day of admission he also noted flies and maggots within his wound which prompted his visit to the emergency department. -He was admitted and started on vancomycin and Zosyn, MRI was ordered which showed osteomyelitis of the metatarsal remnants also possibly osteomyelitis of the lateral cuboid and calcaneus, as well as a small soft tissue abscess lateral to the patient's large open wound, there are also locules of gas surrounding the base of the fifth metatarsal. -Above has been discussed with Dr. Otelia Sergeant, orthopedics covering for Dr. Lajoyce Corners, who has recommended transfer to [facility] for surgical evaluation by Dr. Lajoyce Corners.  Insulin-dependent diabetes mellitus -CBGs above 500 on admission, improving with treatment. -Continue to adjust insulin as needed.  Alcohol abuse -thiamine/folate, continue to monitor for withdrawals on CIWA protocol.  Social issues -Patient is homeless and as such poses unique challenges with discharge disposition following amputation, possibly long-term antibiotics. Recommend social work Administrator, sports.  Code Status: Full code Family Communication: Patient only  Disposition Plan: Transfer to [facility] today   Consultants:  Orthopedics   Antibiotics:  Vancomycin  Zosyn   Subjective: Complains of pain of the right foot  Objective: Filed Vitals:   10/13/14 0617 10/13/14 1137 10/13/14 1248 10/13/14 1432  BP: 131/79 119/68 138/86 140/81  Pulse: 54 55 60 55  Temp: 98.2 F (36.8 C) 98.4 F (36.9 C) 98.3 F (36.8 C) 98.3 F (36.8 C)  TempSrc: Oral  Oral Oral    Resp: 18 18 18 16   Height:      Weight: 70.035 kg (154 lb 6.4 oz)     SpO2: 99% 97% 100% 100%    Intake/Output Summary (Last 24 hours) at 10/13/14 1620 Last data filed at 10/13/14 1432  Gross per 24 hour  Intake  582.5 ml  Output   1150 ml  Net -567.5 ml   Filed Weights   10/12/14 1310 10/12/14 1718 10/13/14 0617  Weight: 77.111 kg (170 lb) 68.176 kg (150 lb 4.8 oz) 70.035 kg (154 lb 6.4 oz)    Exam:   General:  Alert, awake, oriented 3  Cardiovascular: Regular rate and rhythm  Respiratory: Clear to auscultation bilaterally  Abdomen: Soft, nontender, nondistended, positive bowel sounds  Extremities: Left no clubbing, cyanosis or edema; right covered and clean dressing   Neurologic:  Nonfocal  Data Reviewed: Basic Metabolic Panel:  Recent Labs Lab 10/12/14 1359 10/13/14 0631  NA 131* 137  K 4.8 4.1  CL 96* 105  CO2 25 26  GLUCOSE 526* 162*  BUN 14 16  CREATININE 1.27* 1.06  CALCIUM 8.5* 8.4*   Liver Function Tests: No results for input(s): AST, ALT, ALKPHOS, BILITOT, PROT, ALBUMIN in the last 168 hours. No results for input(s): LIPASE, AMYLASE in the last 168 hours. No results for input(s): AMMONIA in the last 168 hours. CBC:  Recent Labs Lab 10/12/14 1359 10/13/14 0631  WBC 12.6* 10.1  NEUTROABS 10.1*  --   HGB 10.1* 9.4*  HCT 30.4* 28.8*  MCV 95.9 96.0  PLT 360 303   Cardiac Enzymes: No results for input(s): CKTOTAL, CKMB, CKMBINDEX, TROPONINI in the last 168 hours. BNP (last 3 results)  No results for input(s): BNP in the last 8760 hours.  ProBNP (last 3 results) No results for input(s): PROBNP in the last 8760 hours.  CBG:  Recent Labs Lab 10/12/14 1317 10/12/14 2215 10/13/14 0816 10/13/14 1135  GLUCAP 478* 242* 170* 123*    Recent Results (from the past 240 hour(s))  Culture, blood (routine x 2)     Status: None (Preliminary result)   Collection Time: 10/12/14  2:04 PM  Result Value Ref Range Status   Specimen Description  BLOOD LEFT ARM  Final   Special Requests Normal 10CC EACH BOTTLE  Final   Culture PENDING  Incomplete   Report Status PENDING  Incomplete  Culture, blood (routine x 2)     Status: None (Preliminary result)   Collection Time: 10/12/14  2:08 PM  Result Value Ref Range Status   Specimen Description BLOOD RIGHT ARM  Final   Special Requests   Final    Normal BOTTLES DRAWN AEROBIC AND ANAEROBIC 9 CC EACH BOTTLE   Culture PENDING  Incomplete   Report Status PENDING  Incomplete     Studies: Mr Foot Right W Wo Contrast  10/13/2014   CLINICAL DATA:  Diabetic patient status post amputation at the bases of the metatarsals of the right foot 09/20/2014. Status post fall several days ago with a right foot injury and wound dehiscence. Redness, pain and swelling.  EXAM: MRI OF THE RIGHT FOOT WITHOUT AND WITH CONTRAST  TECHNIQUE: Multiplanar, multisequence MR imaging was performed both before and after administration of intravenous contrast.  CONTRAST:  14 mL MULTIHANCE GADOBENATE DIMEGLUMINE 529 MG/ML IV SOLN  COMPARISON:  Plain films of the right foot 10/01/2014, 10/10/2014 and 10/12/2014.  FINDINGS: Postoperative change of amputation at the level the bases of all metatarsals is identified. There is a large open wound at the surgical site eccentric medially. The wound is centered over the remaining first, second and third metatarsals. In the soft tissues deep to the lateral margin of the wound, a skin ulceration is seen along the lateral margin of the remaining fifth metatarsal with locules of gas in soft tissues deep to the ulceration. There is a thin collection of fluid versus granulation tissue tracking deep to this wound laterally at the level of the fifth metatarsal.  Intense marrow edema and enhancement are seen throughout all of the metatarsal remnants. Less intense marrow edema and enhancement are also seen in the periphery of the cuboid. A mild degree of edema and enhancement are noted in the calcaneus  centered at the peroneal tubercle.  IMPRESSION: Status post amputation at the level the bases of metatarsals. Intense marrow edema and enhancement throughout all the metatarsals remnants is consistent with osteomyelitis. Milder degree of edema and enhancement in the lateral cuboid is also worrisome for osteomyelitis. Very mild edema and enhancement in the calcaneus could be secondary to reactive change or osteomyelitis.  Likely small soft tissue abscess lateral to the patient's large open wound.  Skin ulceration over the base of fifth metatarsal. Soft tissues deep to the ulceration and locules of gas.   Electronically Signed   By: Drusilla Kanner M.D.   On: 10/13/2014 08:23   Dg Foot Complete Right  10/12/2014   CLINICAL DATA:  Right foot amputation 09/12/2014. Pain. Erythema. Using.  EXAM: RIGHT FOOT COMPLETE - 3+ VIEW  COMPARISON:  10/10/2014  FINDINGS: There is irregularity of the soft tissues along the amputation bed. There is some gas penetration along the soft tissues, similar to 10/10/2014. The  foot is amputated at the proximal metatarsal metaphyseal level.  Soft tissue swelling along the stump and midfoot. No obvious osteolysis along the metatarsal bases.  IMPRESSION: 1. Speckled gas density centrally along the amputation bed extending back towards the metatarsal bases, question breakdown of the amputation bed flap. Localized soft tissue infection is not excluded. There is considerable soft tissue swelling in the midfoot and along the amputation bed. No bony destruction.   Electronically Signed   By: Gaylyn Rong M.D.   On: 10/12/2014 14:22    Scheduled Meds: . folic acid  1 mg Oral Daily  . heparin subcutaneous  5,000 Units Subcutaneous 3 times per day  . insulin aspart  0-5 Units Subcutaneous QHS  . insulin aspart  0-9 Units Subcutaneous TID WC  . insulin glargine  10 Units Subcutaneous QHS  . multivitamin with minerals  1 tablet Oral Daily  . piperacillin-tazobactam (ZOSYN)  IV  3.375  g Intravenous Q8H  . thiamine  100 mg Oral Daily   Or  . thiamine  100 mg Intravenous Daily  . vancomycin  1,000 mg Intravenous Q12H   Continuous Infusions:   Principal Problem:   Wound infection Active Problems:   Type 2 diabetes mellitus with peripheral neuropathy   Nonpsychotic mental disorder   GAD (generalized anxiety disorder)   Alcohol use disorder, severe, dependence    Time spent: 40 minutes. Greater than 50% of this time was spent in direct contact with the patient coordinating care.    Chaya Jan  [facility] Pager [phone]  If 7PM-7AM, please contact night-coverage at [email or web address], password [facility] 10/13/2014, 4:20 PM  LOS: 1 day

## 2014-10-13 NOTE — Progress Notes (Signed)
Pt states he had a pain of a 10. Bought in pain medicine, but pt states he changed his mind and that he does not want the pain medicine. Pt now claims his pain is a 5, without any interventions.

## 2014-10-14 DIAGNOSIS — F331 Major depressive disorder, recurrent, moderate: Secondary | ICD-10-CM

## 2014-10-14 DIAGNOSIS — F411 Generalized anxiety disorder: Secondary | ICD-10-CM

## 2014-10-14 DIAGNOSIS — F330 Major depressive disorder, recurrent, mild: Secondary | ICD-10-CM | POA: Insufficient documentation

## 2014-10-14 LAB — GLUCOSE, CAPILLARY
GLUCOSE-CAPILLARY: 266 mg/dL — AB (ref 65–99)
GLUCOSE-CAPILLARY: 190 mg/dL — AB (ref 65–99)
Glucose-Capillary: 303 mg/dL — ABNORMAL HIGH (ref 65–99)
Glucose-Capillary: 274 mg/dL — ABNORMAL HIGH (ref 65–99)

## 2014-10-14 MED ORDER — MORPHINE SULFATE 2 MG/ML IJ SOLN
2.0000 mg | INTRAMUSCULAR | Status: DC | PRN
  Administered 2014-10-14 – 2014-10-16 (×8): 2 mg via INTRAVENOUS
  Filled 2014-10-14 (×8): qty 1

## 2014-10-14 MED ORDER — INSULIN ASPART PROT & ASPART (70-30 MIX) 100 UNIT/ML ~~LOC~~ SUSP
15.0000 [IU] | Freq: Two times a day (BID) | SUBCUTANEOUS | Status: DC
  Filled 2014-10-14: qty 10

## 2014-10-14 MED ORDER — SERTRALINE HCL 50 MG PO TABS
50.0000 mg | ORAL_TABLET | Freq: Every day | ORAL | Status: DC
  Administered 2014-10-15 – 2014-10-18 (×3): 50 mg via ORAL
  Filled 2014-10-14 (×4): qty 1

## 2014-10-14 MED ORDER — GABAPENTIN 300 MG PO CAPS
300.0000 mg | ORAL_CAPSULE | Freq: Three times a day (TID) | ORAL | Status: DC
  Administered 2014-10-14 – 2014-10-18 (×11): 300 mg via ORAL
  Filled 2014-10-14 (×14): qty 1

## 2014-10-14 MED ORDER — INSULIN ASPART PROT & ASPART (70-30 MIX) 100 UNIT/ML ~~LOC~~ SUSP
20.0000 [IU] | Freq: Two times a day (BID) | SUBCUTANEOUS | Status: DC
  Administered 2014-10-14 – 2014-10-18 (×8): 20 [IU] via SUBCUTANEOUS
  Filled 2014-10-14 (×2): qty 10

## 2014-10-14 MED ORDER — INSULIN ASPART 100 UNIT/ML ~~LOC~~ SOLN
0.0000 [IU] | Freq: Three times a day (TID) | SUBCUTANEOUS | Status: DC
  Administered 2014-10-14: 8 [IU] via SUBCUTANEOUS
  Administered 2014-10-15 (×2): 5 [IU] via SUBCUTANEOUS
  Administered 2014-10-16: 2 [IU] via SUBCUTANEOUS
  Administered 2014-10-17: 3 [IU] via SUBCUTANEOUS
  Administered 2014-10-17: 11 [IU] via SUBCUTANEOUS
  Administered 2014-10-18: 5 [IU] via SUBCUTANEOUS
  Administered 2014-10-18 (×2): 3 [IU] via SUBCUTANEOUS

## 2014-10-14 MED ORDER — TRAZODONE HCL 50 MG PO TABS
50.0000 mg | ORAL_TABLET | Freq: Every evening | ORAL | Status: DC | PRN
  Administered 2014-10-15: 50 mg via ORAL
  Filled 2014-10-14 (×2): qty 1

## 2014-10-14 MED ORDER — ARIPIPRAZOLE 5 MG PO TABS
5.0000 mg | ORAL_TABLET | Freq: Every day | ORAL | Status: DC
  Administered 2014-10-15 – 2014-10-18 (×3): 5 mg via ORAL
  Filled 2014-10-14 (×4): qty 1

## 2014-10-14 NOTE — Consult Note (Signed)
[facility] Face-to-Face Psychiatry Consult   Reason for Consult:  Needs medication for Major depressive disorder, recurrent and anxiety Referring Physician: Dr. Sharma Covert Patient Identification: Tyler Burgess MRN:  [id number] Principal Diagnosis: MDD-recurrent moderate, Generalized anxiety disorder Diagnosis:   Patient Active Problem List   Diagnosis Date Noted  . Wound infection [T79.8XXA] 10/12/2014  . Depression [F32.9]   . Diabetic ketoacidosis without coma associated with type 2 diabetes mellitus [E13.10]   . Weakness [R53.1]   . DKA (diabetic ketoacidoses) [E13.10] 09/15/2014  . Hyponatremia [E87.1] 09/15/2014  . DKA, type 2, not at goal [E13.10] 09/15/2014  . Suicidal ideations [R45.851]   . Diarrhea [R19.7]   . Enteritis due to Clostridium difficile [A04.7] 09/05/2014  . Osteomyelitis [M86.9] 08/31/2014  . Sepsis [A41.9] 08/31/2014  . Substance abuse [F19.10]   . Suicidal ideation [R45.851]   . Type 2 diabetes mellitus with hyperglycemia [E11.65] 04/15/2014  . Suicidal behavior [F48.9] 04/11/2014  . 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 GAD (generalized anxiety disorder) [F41.1] 04/11/2014  . Alcohol use disorder, severe, dependence [F10.20] 04/11/2014  . Opioid use disorder, moderate, in sustained remission [F11.90] 04/11/2014  . Opioid dependence in remission [F11.21] 01/10/2014  . Severe major depression without psychotic features [F32.2] 01/09/2014  . 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 Type 2 diabetes mellitus with peripheral neuropathy [E11.40] 12/11/2013  . Nonpsychotic mental disorder [F48.9] 12/11/2013    Total Time spent with patient: 50 minutes  Subjective:   Tyler Burgess is a 59 y.o. male patient admitted with post surgical foot infection .  HPI:  Patient is  59 year old male with history of diabetes, depression and anxiety. Thanks for asking me to evaluate the patient for medication management.  Patient reports that he has not been taking his mental health medications regularly for the past one month. However, he reports that his medications helped when he was compliant and would like to get back on them. He reports vague depressive symptoms, anxiety, excessive worries, difficulty sleeping, apprehension and poor concentration. He is currently homeless, in poor medical health and poor financial state. Patient reports history of alcohol abuse but not in the past few weeks. Currently, he denies suicidal ideations, psychosis or delusional thinking.  HPI Elements:   Location:  depression, anxiety. Quality:  mild. Duration:  for past 2 months. Context:  life stress and medical problem.  Past Medical History:  Past Medical History  Diagnosis Date  . Diabetes mellitus without complication   . Depression     Past Surgical History  Procedure Laterality Date  . Right arm    . Cholecystectomy    . Amputation toe Right 09/03/2014    Procedure: AMPUTATION RIGHT GREAT TOE AND SECOND TOE;  Surgeon: Aviva Signs Md, MD;  Location: [facility];  Service: General;  Laterality: Right;  right great and second toes  . Amputation Right 09/20/2014    Procedure: RIGHT TRANSMETATARSAL AMPUTATION;  Surgeon: Newt Minion, MD;  Location: [facility];  Service: Orthopedics;  Laterality: Right;   Family History:  Family History  Problem Relation Age of Onset  . Diabetes Mother   . Depression Father   . Depression Brother   . Diabetes Other   . Depression Other    Social History:  History  Alcohol Use  . Yes    Comment: once per month     History  Drug Use No    History   Social History  . Marital Status: Widowed  Spouse Name: N/A  . Number of Children: N/A  . Years of Education: N/A   Social History Main Topics  . Smoking status: Current Every Day Smoker -- 0.50 packs/day for 35 years    Types: Cigarettes  . Smokeless tobacco: Never Used  . Alcohol Use: Yes     Comment: once per month  . Drug  Use: No  . Sexual Activity: No   Other Topics Concern  . None   Social History Narrative   Homeless and supposed to go to a Shelter in [place].  [facility] could not get medications right and so he took a loaded pistol into the center and had to be jailed for 9 days.  Gets around in a truck.  Lives in his truck and occasionally visits his brother.     Additional Social History:                          Allergies:  No Known Allergies  Labs:  Results for orders placed or performed during the hospital encounter of 10/12/14 (from the past 48 hour(s))  Urinalysis, Routine w reflex microscopic (not at [facility])     Status: Abnormal   Collection Time: 10/12/14  5:25 PM  Result Value Ref Range   Color, Urine YELLOW YELLOW   APPearance CLEAR CLEAR   Specific Gravity, Urine 1.020 1.005 - 1.030   pH 6.0 5.0 - 8.0   Glucose, UA >1000 (A) NEGATIVE mg/dL   Hgb urine dipstick TRACE (A) NEGATIVE   Bilirubin Urine NEGATIVE NEGATIVE   Ketones, ur NEGATIVE NEGATIVE mg/dL   Protein, ur TRACE (A) NEGATIVE mg/dL   Urobilinogen, UA 0.2 0.0 - 1.0 mg/dL   Nitrite NEGATIVE NEGATIVE   Leukocytes, UA NEGATIVE NEGATIVE  Urine rapid drug screen (hosp performed)     Status: Abnormal   Collection Time: 10/12/14  5:25 PM  Result Value Ref Range   Opiates POSITIVE (A) NONE DETECTED   Cocaine NONE DETECTED NONE DETECTED   Benzodiazepines NONE DETECTED NONE DETECTED   Amphetamines NONE DETECTED NONE DETECTED   Tetrahydrocannabinol NONE DETECTED NONE DETECTED   Barbiturates NONE DETECTED NONE DETECTED    Comment:        DRUG SCREEN FOR MEDICAL PURPOSES ONLY.  IF CONFIRMATION IS NEEDED FOR ANY PURPOSE, NOTIFY LAB WITHIN 5 DAYS.        LOWEST DETECTABLE LIMITS FOR URINE DRUG SCREEN Drug Class       Cutoff (ng/mL) Amphetamine      1000 Barbiturate      200 Benzodiazepine   376 Tricyclics       283 Opiates          300 Cocaine          300 THC              50   Urine microscopic-add  on     Status: None   Collection Time: 10/12/14  5:25 PM  Result Value Ref Range   Squamous Epithelial / LPF RARE RARE   WBC, UA 0-2 <3 WBC/hpf   RBC / HPF 3-6 <3 RBC/hpf   Bacteria, UA RARE RARE  Glucose, capillary     Status: Abnormal   Collection Time: 10/12/14 10:15 PM  Result Value Ref Range   Glucose-Capillary 242 (H) 65 - 99 mg/dL  Basic metabolic panel     Status: Abnormal   Collection Time: 10/13/14  6:31 AM  Result Value Ref Range  Sodium 137 135 - 145 mmol/L   Potassium 4.1 3.5 - 5.1 mmol/L   Chloride 105 101 - 111 mmol/L   CO2 26 22 - 32 mmol/L   Glucose, Bld 162 (H) 65 - 99 mg/dL   BUN 16 6 - 20 mg/dL   Creatinine, Ser 1.06 0.61 - 1.24 mg/dL   Calcium 8.4 (L) 8.9 - 10.3 mg/dL   GFR calc non Af Amer >60 >60 mL/min   GFR calc Af Amer >60 >60 mL/min    Comment: (NOTE) The eGFR has been calculated using the CKD EPI equation. This calculation has not been validated in all clinical situations. eGFR's persistently <60 mL/min signify possible Chronic Kidney Disease.    Anion gap 6 5 - 15  CBC     Status: Abnormal   Collection Time: 10/13/14  6:31 AM  Result Value Ref Range   WBC 10.1 4.0 - 10.5 K/uL   RBC 3.00 (L) 4.22 - 5.81 MIL/uL   Hemoglobin 9.4 (L) 13.0 - 17.0 g/dL   HCT 28.8 (L) 39.0 - 52.0 %   MCV 96.0 78.0 - 100.0 fL   MCH 31.3 26.0 - 34.0 pg   MCHC 32.6 30.0 - 36.0 g/dL   RDW 14.2 11.5 - 15.5 %   Platelets 303 150 - 400 K/uL  Glucose, capillary     Status: Abnormal   Collection Time: 10/13/14  8:16 AM  Result Value Ref Range   Glucose-Capillary 170 (H) 65 - 99 mg/dL   Comment 1 Notify RN   Glucose, capillary     Status: Abnormal   Collection Time: 10/13/14 11:35 AM  Result Value Ref Range   Glucose-Capillary 123 (H) 65 - 99 mg/dL  Glucose, capillary     Status: Abnormal   Collection Time: 10/13/14  4:47 PM  Result Value Ref Range   Glucose-Capillary 305 (H) 65 - 99 mg/dL  Glucose, capillary     Status: Abnormal   Collection Time: 10/13/14  9:20  PM  Result Value Ref Range   Glucose-Capillary 178 (H) 65 - 99 mg/dL  Glucose, capillary     Status: Abnormal   Collection Time: 10/14/14  7:49 AM  Result Value Ref Range   Glucose-Capillary 266 (H) 65 - 99 mg/dL  Glucose, capillary     Status: Abnormal   Collection Time: 10/14/14 10:19 AM  Result Value Ref Range   Glucose-Capillary 303 (H) 65 - 99 mg/dL    Vitals: Blood pressure 133/76, pulse 50, temperature 98.2 F (36.8 C), temperature source Oral, resp. rate 18, height _0  (1.778 m), weight 69.854 kg (154 lb), SpO2 100 %.  Risk to Self: Is patient at risk for suicide?: No Risk to Others:   Prior Inpatient Therapy:   Prior Outpatient Therapy:    Current Facility-Administered Medications  Medication Dose Route Frequency Provider Last Rate Last Dose  . folic acid (FOLVITE) tablet 1 mg  1 mg Oral Daily Phillips Grout, MD   1 mg at 10/14/14 0935  . gabapentin (NEURONTIN) capsule 300 mg  300 mg Oral TID Jonetta Osgood, MD   300 mg at 10/14/14 1545  . heparin injection 5,000 Units  5,000 Units Subcutaneous 3 times per day Erline Hau, MD   5,000 Units at 10/14/14 1500  . HYDROcodone-acetaminophen (NORCO/VICODIN) 5-325 MG per tablet 1-2 tablet  1-2 tablet Oral Q4H PRN Phillips Grout, MD   2 tablet at 10/14/14 1600  . insulin aspart (novoLOG) injection 0-15 Units  0-15 Units  Subcutaneous TID WC Jonetta Osgood, MD   0 Units at 10/14/14 1200  . insulin aspart protamine- aspart (NOVOLOG MIX 70/30) injection 20 Units  20 Units Subcutaneous BID WC Shanker Kristeen Mans, MD      . LORazepam (ATIVAN) tablet 1 mg  1 mg Oral Q6H PRN Phillips Grout, MD       Or  . LORazepam (ATIVAN) injection 1 mg  1 mg Intravenous Q6H PRN Phillips Grout, MD      . morphine 2 MG/ML injection 1 mg  1 mg Intravenous Q4H PRN Erline Hau, MD   1 mg at 10/14/14 0934  . multivitamin with minerals tablet 1 tablet  1 tablet Oral Daily Phillips Grout, MD   1 tablet at 10/14/14 0935  .  ondansetron (ZOFRAN) tablet 4 mg  4 mg Oral Q6H PRN Phillips Grout, MD       Or  . ondansetron (ZOFRAN) injection 4 mg  4 mg Intravenous Q6H PRN Phillips Grout, MD      . piperacillin-tazobactam (ZOSYN) IVPB 3.375 g  3.375 g Intravenous Q8H Orpah Greek, MD   3.375 g at 10/14/14 1545  . thiamine (VITAMIN B-1) tablet 100 mg  100 mg Oral Daily Phillips Grout, MD   100 mg at 10/14/14 0935   Or  . thiamine (B-1) injection 100 mg  100 mg Intravenous Daily Phillips Grout, MD      . vancomycin (VANCOCIN) IVPB 1000 mg/200 mL premix  1,000 mg Intravenous Q12H Orpah Greek, MD   1,000 mg at 10/14/14 1545    Musculoskeletal: Strength & Muscle Tone: within normal limits Gait & Station: normal Patient leans: N/A  Psychiatric Specialty Exam: Physical Exam  Review of Systems  Constitutional: Negative.   HENT: Negative.   Eyes: Negative.   Respiratory: Negative.   Cardiovascular: Negative.   Gastrointestinal: Negative.   Genitourinary: Negative.   Musculoskeletal:       S/p left foot amputation two weeks ago.  Skin: Negative.   Neurological: Negative.   Endo/Heme/Allergies: Negative.     Blood pressure 133/76, pulse 50, temperature 98.2 F (36.8 C), temperature source Oral, resp. rate 18, height _0  (1.778 m), weight 69.854 kg (154 lb), SpO2 100 %.Body mass index is 22.1 kg/(m^2).  General Appearance: Casual  Eye Contact::  Good  Speech:  Clear and Coherent and Normal Rate  Volume:  Normal  Mood:  Anxious, Depressed and Hopeless  Affect:  Appropriate and Congruent  Thought Process:  Coherent, Goal Directed and Intact  Orientation:  Full (Time, Place, and Person)  Thought Content:  WDL  Suicidal Thoughts:  No  Homicidal Thoughts:  No  Memory:  Immediate;   Good Recent;   Good Remote;   Good  Judgement:  Fair  Insight:  Fair and Shallow  Psychomotor Activity:  Normal  Concentration:  Fair  Recall:  NA  Fund of Knowledge:Fair  Language: Good  Akathisia:  NA   Handed:  Right  AIMS (if indicated):     Assets:  Desire for Improvement  ADL's:  Intact  Cognition: WNL  Sleep:   poor   Medical Decision Making: Established Problem, Stable/Improving (1), Review or order clinical lab tests (1), Review or order medicine tests (1), Review of Medication Regimen & Side Effects (2) and Review of New Medication or Change in Dosage (2)  Treatment Plan Summary: Daily contact with patient to assess and evaluate symptoms and progress in treatment.  Medication management:  Resume Abilify 5 mg po daily for depressed mood, Zoloft 50 mg po daily for depression, Cogentin 0.5 mg po daily for EPS and Trazodone 55m Qhs prn insomnia.  Disposition: Please refer patient to outpatient psychiatric service upon discharge.  ACorena Pilgrim,MD 10/14/2014 4:12 PM

## 2014-10-14 NOTE — Progress Notes (Signed)
Dr. Ophelia Charter in to see pt. Order entered to change dressing daily. Md instructed nurse to use dry gauze dressing and pack well daily until pt has surgery.

## 2014-10-14 NOTE — Progress Notes (Signed)
Patient ID: Tyler Burgess, male   DOB: 1956-01-16, 59 y.o.   MRN: [id number] Dressing change today by RN.  Dr. Lajoyce Corners to see Monday. On IV ABX. Will need more surgery.

## 2014-10-14 NOTE — Progress Notes (Signed)
PATIENT DETAILS Name: Tyler Burgess Age: 59 y.o. Sex: male Date of Birth: 1955/08/11 Admit Date: 10/12/2014 Admitting Physician Haydee Monica, MD PCP:No PCP Per Patient  Subjective: Pain in the right foot area. Claims that his mood is stable-and denies any suicidal or homicidal ideation. Endorses frustration regarding being homeless with medical issues. Unable to afford either insulin or any of his psychiatric medications.  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 Await blood cultures. Await input from Dr. Lajoyce Corners  Active Problems: Type 2 diabetes mellitus with peripheral neuropathy: Uncontrolled, last A1c 13.0 on 09/16/14. Unable to afford Lantus-hence discontinue Lantus-start insulin 70/3015 units twice a day. Will start oral hypoglycemic agents when close at discharge. Resume Neurontin for peripheral neuropathy. Follow and adjust accordingly.  Anemia: Likely secondary to chronic disease. Abdomen stable, Follow.  Depression: Denies any suicidal or homicidal ideation. Claims unable to afford any of his psychiatric medications-therefore noncompliant. Have consulted psychiatry for medication management-see if he can be switched to alternative medications that is more affordable.  Alcohol abuse: Currently awake and alert. Continue Ativan per [facility] protocol  Homelessness:social worker eval  Disposition: Remain inpatient  Antimicrobial agents  See below  Anti-infectives    Start     Dose/Rate Route Frequency Ordered Stop   10/13/14 0400  vancomycin (VANCOCIN) IVPB 1000 mg/200 mL premix     1,000 mg 200 mL/hr over 60 Minutes Intravenous Every 12 hours 10/12/14 1817     10/12/14 2200  piperacillin-tazobactam (ZOSYN) IVPB 3.375 g     3.375 g 12.5 mL/hr over 240 Minutes Intravenous Every 8 hours 10/12/14 1817     10/12/14 1430   vancomycin (VANCOCIN) 1,500 mg in sodium chloride 0.9 % 500 mL IVPB     1,500 mg 250 mL/hr over 120 Minutes Intravenous  Once 10/12/14 1406 10/12/14 1705   10/12/14 1415  piperacillin-tazobactam (ZOSYN) IVPB 3.375 g     3.375 g 100 mL/hr over 30 Minutes Intravenous STAT 10/12/14 1406 10/12/14 1505      DVT Prophylaxis: Prophylactic Heparin  Code Status: Full code   Family Communication None at bedside  Procedures: None  CONSULTS:  orthopedic surgery  Time spent 35 minutes-Greater than 50% of this time was spent in counseling, explanation of diagnosis, planning of further management, and coordination of care.  MEDICATIONS: Scheduled Meds: . folic acid  1 mg Oral Daily  . heparin subcutaneous  5,000 Units Subcutaneous 3 times per day  . insulin aspart  0-5 Units Subcutaneous QHS  . insulin aspart  0-9 Units Subcutaneous TID WC  . insulin glargine  10 Units Subcutaneous QHS  . multivitamin with minerals  1 tablet Oral Daily  . piperacillin-tazobactam (ZOSYN)  IV  3.375 g Intravenous Q8H  . thiamine  100 mg Oral Daily   Or  . thiamine  100 mg Intravenous Daily  . vancomycin  1,000 mg Intravenous Q12H   Continuous Infusions:  PRN Meds:.HYDROcodone-acetaminophen, LORazepam **OR** LORazepam, morphine injection, ondansetron **OR** ondansetron (ZOFRAN) IV    PHYSICAL EXAM: Vital signs in last 24 hours: Filed Vitals:   10/13/14 1821 10/13/14 2350 10/14/14 0500 10/14/14 0544  BP: 132/77 143/75  146/70  Pulse: 69 58  54  Temp: 98.8 F (37.1 C) 97.4 F (36.3 C)  97.5 F (36.4 C)  TempSrc: Oral Axillary  Axillary  Resp: 18 18  18  Height:      Weight:   69.854 kg (154 lb)   SpO2: 99% 100%  100%    Weight change: -7.258 kg (-16 lb) Filed Weights   10/12/14 1718 10/13/14 0617 10/14/14 0500  Weight: 68.176 kg (150 lb 4.8 oz) 70.035 kg (154 lb 6.4 oz) 69.854 kg (154 lb)   Body mass index is 22.1 kg/(m^2).   Gen Exam: Awake and alert with clear speech. Neck:  Supple, No JVD.   Chest: B/L Clear.   CVS: S1 S2 Regular, no murmurs.  Abdomen: soft, BS +, non tender, non distended.  Extremities: no edema, lower extremities warm to touch. right lower extremity bandaged-did not open Neurologic: Non Focal.   Skin: No Rash.   Wounds: N/A.   Intake/Output from previous day:  Intake/Output Summary (Last 24 hours) at 10/14/14 1141 Last data filed at 10/14/14 0838  Gross per 24 hour  Intake   1690 ml  Output    900 ml  Net    790 ml     LAB RESULTS: CBC  Recent Labs Lab 10/12/14 1359 10/13/14 0631  WBC 12.6* 10.1  HGB 10.1* 9.4*  HCT 30.4* 28.8*  PLT 360 303  MCV 95.9 96.0  MCH 31.9 31.3  MCHC 33.2 32.6  RDW 13.9 14.2  LYMPHSABS 1.6  --   MONOABS 0.8  --   EOSABS 0.1  --   BASOSABS 0.1  --     Chemistries   Recent Labs Lab 10/12/14 1359 10/13/14 0631  NA 131* 137  K 4.8 4.1  CL 96* 105  CO2 25 26  GLUCOSE 526* 162*  BUN 14 16  CREATININE 1.27* 1.06  CALCIUM 8.5* 8.4*    CBG:  Recent Labs Lab 10/13/14 1135 10/13/14 1647 10/13/14 2120 10/14/14 0749 10/14/14 1019  GLUCAP 123* 305* 178* 266* 303*    GFR Estimated Creatinine Clearance: 76 mL/min (by C-G formula based on Cr of 1.06).  Coagulation profile No results for input(s): INR, PROTIME in the last 168 hours.  Cardiac Enzymes No results for input(s): CKMB, TROPONINI, MYOGLOBIN in the last 168 hours.  Invalid input(s): CK  Invalid input(s): POCBNP No results for input(s): DDIMER in the last 72 hours. No results for input(s): HGBA1C in the last 72 hours. No results for input(s): CHOL, HDL, LDLCALC, TRIG, CHOLHDL, LDLDIRECT in the last 72 hours. No results for input(s): TSH, T4TOTAL, T3FREE, THYROIDAB in the last 72 hours.  Invalid input(s): FREET3 No results for input(s): VITAMINB12, FOLATE, FERRITIN, TIBC, IRON, RETICCTPCT in the last 72 hours. No results for input(s): LIPASE, AMYLASE in the last 72 hours.  Urine Studies No results for input(s):  UHGB, CRYS in the last 72 hours.  Invalid input(s): UACOL, UAPR, USPG, UPH, UTP, UGL, UKET, UBIL, UNIT, UROB, ULEU, UEPI, UWBC, URBC, UBAC, CAST, UCOM, BILUA  MICROBIOLOGY: Recent Results (from the past 240 hour(s))  Culture, blood (routine x 2)     Status: None (Preliminary result)   Collection Time: 10/12/14  2:04 PM  Result Value Ref Range Status   Specimen Description BLOOD LEFT ARM  Final   Special Requests Normal 10CC EACH BOTTLE  Final   Culture PENDING  Incomplete   Report Status PENDING  Incomplete  Culture, blood (routine x 2)     Status: None (Preliminary result)   Collection Time: 10/12/14  2:08 PM  Result Value Ref Range Status   Specimen Description BLOOD RIGHT ARM  Final   Special Requests   Final  Normal BOTTLES DRAWN AEROBIC AND ANAEROBIC 9 CC EACH BOTTLE   Culture PENDING  Incomplete   Report Status PENDING  Incomplete    RADIOLOGY STUDIES/RESULTS: Ct Head Wo Contrast  09/22/2014   CLINICAL DATA:  Code stroke. Right arm numbness and weakness, acute onset. Initial encounter.  EXAM: CT HEAD WITHOUT CONTRAST  TECHNIQUE: Contiguous axial images were obtained from the base of the skull through the vertex without intravenous contrast.  COMPARISON:  None.  FINDINGS: There is no evidence of acute infarction, mass lesion, or intra- or extra-axial hemorrhage on CT.  Prominence of the ventricles and sulci reflects mild cortical volume loss. Mild periventricular white matter change likely reflects small vessel ischemic microangiopathy.  The brainstem and fourth ventricle are within normal limits. The basal ganglia are unremarkable in appearance. The cerebral hemispheres demonstrate grossly normal gray-white differentiation. No mass effect or midline shift is seen.  There is no evidence of fracture; visualized osseous structures are unremarkable in appearance. The orbits are within normal limits. The paranasal sinuses and mastoid air cells are well-aerated. No significant soft tissue  abnormalities are seen.  IMPRESSION: 1. No acute intracranial pathology seen on CT. 2. Mild cortical volume loss and scattered small vessel ischemic microangiopathy.  These results were called by telephone at the time of interpretation on 09/22/2014 at 5:37 pm to Dr. Susa Raring, who verbally acknowledged these results.   Electronically Signed   By: Roanna Raider M.D.   On: 09/22/2014 17:37   Mr Brain Wo Contrast  09/23/2014   CLINICAL DATA:  RIGHT-sided weakness and slurred speech beginning Sep 22, 2014. Status post RIGHT foot partial amputation Sep 20, 2014. History of diabetes, severe alcohol use, osteomyelitis.  EXAM: MRI HEAD WITHOUT CONTRAST  TECHNIQUE: Multiplanar, multiecho pulse sequences of the brain and surrounding structures were obtained without intravenous contrast.  COMPARISON:  CT head Sep 22, 2014  FINDINGS: Mild proportional prominence of the ventricles and sulci for age. No abnormal parenchymal signal, mass lesions, mass effect. No reduced diffusion to suggest acute ischemia. No susceptibility artifact to suggest hemorrhage.  No abnormal extra-axial fluid collections. No extra-axial masses though, contrast enhanced sequences would be more sensitive. Normal major intracranial vascular flow voids seen at the skull base.  Ocular globes and orbital contents are unremarkable though not tailored for evaluation. No abnormal sellar expansion. Mild paranasal sinus mucosal thickening. The mastoid air cells are well aerated. No suspicious calvarial bone marrow signal. No abnormal sellar expansion. Craniocervical junction maintained.  IMPRESSION: No acute intracranial process; specifically no acute ischemia.  Mild global parenchymal brain volume loss for age.   Electronically Signed   By: Awilda Metro M.D.   On: 09/23/2014 02:09   Mr Foot Right W Wo Contrast  10/13/2014   CLINICAL DATA:  Diabetic patient status post amputation at the bases of the metatarsals of the right foot 09/20/2014. Status  post fall several days ago with a right foot injury and wound dehiscence. Redness, pain and swelling.  EXAM: MRI OF THE RIGHT FOOT WITHOUT AND WITH CONTRAST  TECHNIQUE: Multiplanar, multisequence MR imaging was performed both before and after administration of intravenous contrast.  CONTRAST:  14 mL MULTIHANCE GADOBENATE DIMEGLUMINE 529 MG/ML IV SOLN  COMPARISON:  Plain films of the right foot 10/01/2014, 10/10/2014 and 10/12/2014.  FINDINGS: Postoperative change of amputation at the level the bases of all metatarsals is identified. There is a large open wound at the surgical site eccentric medially. The wound is centered over the remaining first, second and  third metatarsals. In the soft tissues deep to the lateral margin of the wound, a skin ulceration is seen along the lateral margin of the remaining fifth metatarsal with locules of gas in soft tissues deep to the ulceration. There is a thin collection of fluid versus granulation tissue tracking deep to this wound laterally at the level of the fifth metatarsal.  Intense marrow edema and enhancement are seen throughout all of the metatarsal remnants. Less intense marrow edema and enhancement are also seen in the periphery of the cuboid. A mild degree of edema and enhancement are noted in the calcaneus centered at the peroneal tubercle.  IMPRESSION: Status post amputation at the level the bases of metatarsals. Intense marrow edema and enhancement throughout all the metatarsals remnants is consistent with osteomyelitis. Milder degree of edema and enhancement in the lateral cuboid is also worrisome for osteomyelitis. Very mild edema and enhancement in the calcaneus could be secondary to reactive change or osteomyelitis.  Likely small soft tissue abscess lateral to the patient's large open wound.  Skin ulceration over the base of fifth metatarsal. Soft tissues deep to the ulceration and locules of gas.   Electronically Signed   By: Drusilla Kanner M.D.   On:  10/13/2014 08:23   Dg Chest Port 1 View  09/25/2014   CLINICAL DATA:  Fever.  EXAM: PORTABLE CHEST - 1 VIEW  COMPARISON:  09/01/2014  FINDINGS: The cardiomediastinal contours are normal. Linear atelectasis in the left greater than right lower lung zone. Pulmonary vasculature is normal. No consolidation, pleural effusion, or pneumothorax. A skin fold projects over the left hemithorax. No acute osseous abnormalities are seen.  IMPRESSION: Minimal subsegmental atelectasis at the lung bases. No consolidation to suggest pneumonia.   Electronically Signed   By: Rubye Oaks M.D.   On: 09/25/2014 06:30   Dg Foot 2 Views Right  09/16/2014   CLINICAL DATA:  Osteomyelitis.  EXAM: RIGHT FOOT - 2 VIEW  COMPARISON:  08/31/2014  FINDINGS: Interval resection of the first and second toe. There is now cortical irregularity of the distal first metatarsal consistent with osteomyelitis. Soft tissue air noted in the surgical bed about the second digit. No additional site concerning for osteomyelitis.  IMPRESSION: Post resection of the first and second toes. 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   Electronically Signed   By: Rubye Oaks M.D.   On: 09/16/2014 02:48   Dg Foot Complete Right  10/12/2014   CLINICAL DATA:  Right foot amputation 09/12/2014. Pain. Erythema. Using.  EXAM: RIGHT FOOT COMPLETE - 3+ VIEW  COMPARISON:  10/10/2014  FINDINGS: There is irregularity of the soft tissues along the amputation bed. There is some gas penetration along the soft tissues, similar to 10/10/2014. The foot is amputated at the proximal metatarsal metaphyseal level.  Soft tissue swelling along the stump and midfoot. No obvious osteolysis along the metatarsal bases.  IMPRESSION: 1. Speckled gas density centrally along the amputation bed extending back towards the metatarsal bases, question breakdown of the amputation bed flap. Localized soft tissue infection is not excluded. There is considerable  soft tissue swelling in the midfoot and along the amputation bed. No bony destruction.   Electronically Signed   By: Gaylyn Rong M.D.   On: 10/12/2014 14:22   Dg Foot Complete Right  10/10/2014   CLINICAL DATA:  Recent metatarsal amputation. Fell. Pain and swelling.  EXAM: RIGHT FOOT COMPLETE - 3+ VIEW  COMPARISON:  10/01/2014  FINDINGS: Stable surgical changes from  a proximal metatarsal midfoot amputation. There is air in the soft tissues which was not present on the prior film. This could be due to disruption of the wound. I do not see any definite findings for acute fracture or osteomyelitis.  IMPRESSION: New areas noted in the soft tissues of the foot overlying the amputation. This could be due to disruption of the wound or wound dehiscence.  Stable appearance at the amputation site. No fracture or evidence of osteomyelitis.   Electronically Signed   By: Rudie Meyer M.D.   On: 10/10/2014 22:36   Dg Foot Complete Right  10/01/2014   CLINICAL DATA:  Right foot pain and swelling after recent amputation on 09/20/2014.  EXAM: RIGHT FOOT COMPLETE - 3+ VIEW  COMPARISON:  09/15/2014  FINDINGS: The patient is status post transmetatarsal amputation of all 5 toes across the proximal metatarsal level. Some irregularity of overlying soft tissues is identified without evidence of soft tissue gas or foreign body. Bony amputation margins appear unremarkable with no evidence of bony destruction. No fractures identified.  IMPRESSION: No acute findings by x-ray of the right foot status post recent transmetatarsal amputation.   Electronically Signed   By: Irish Lack M.D.   On: 10/01/2014 14:28    Jeoffrey Massed, MD  [facility] Pager:[phone]  If 7PM-7AM, please contact night-coverage [email or web address] Password [email or web address] 10/14/2014, 11:41 AM   LOS: 2 days

## 2014-10-14 NOTE — Consult Note (Signed)
Reason for Consult: Dehiscence transmetatarsal amputation right foot Referring Physician: Dr. Lorin Mercy and Dr. Madelyn Flavors is an 59 y.o. male.  HPI: Patient is a 59 year old gentleman with a very difficult social and medical conditions. Patient is essentially homeless. The few family members that are still alive state they will not care for him. Patient has psychiatric issues unable to afford his psychiatric medications unable to afford his diabetic medications. Hemoglobin A1c greater than 13. Patient initially presented to [facility] with maggots in the wound. Patient states the hospital suctioned out the maggots he was not taken to surgery and was referred down to [facility] for evaluation and treatment.  Past Medical History  Diagnosis Date  . Diabetes mellitus without complication   . Depression     Past Surgical History  Procedure Laterality Date  . Right arm    . Cholecystectomy    . Amputation toe Right 09/03/2014    Procedure: AMPUTATION RIGHT GREAT TOE AND SECOND TOE;  Surgeon: Aviva Signs Md, MD;  Location: [facility];  Service: General;  Laterality: Right;  right great and second toes  . Amputation Right 09/20/2014    Procedure: RIGHT TRANSMETATARSAL AMPUTATION;  Surgeon: Newt Minion, MD;  Location: [facility];  Service: Orthopedics;  Laterality: Right;    Family History  Problem Relation Age of Onset  . Diabetes Mother   . Depression Father   . Depression Brother   . Diabetes Other   . Depression Other     Social History:  reports that he has been smoking Cigarettes.  He has a 17.5 pack-year smoking history. He has never used smokeless tobacco. He reports that he drinks alcohol. He reports that he does not use illicit drugs.  Allergies: No Known Allergies  Medications: I have reviewed the patient's current medications.  Results for orders placed or performed during the hospital encounter of 10/12/14 (from the past 48 hour(s))  Urinalysis, Routine w reflex  microscopic (not at [facility])     Status: Abnormal   Collection Time: 10/12/14  5:25 PM  Result Value Ref Range   Color, Urine YELLOW YELLOW   APPearance CLEAR CLEAR   Specific Gravity, Urine 1.020 1.005 - 1.030   pH 6.0 5.0 - 8.0   Glucose, UA >1000 (A) NEGATIVE mg/dL   Hgb urine dipstick TRACE (A) NEGATIVE   Bilirubin Urine NEGATIVE NEGATIVE   Ketones, ur NEGATIVE NEGATIVE mg/dL   Protein, ur TRACE (A) NEGATIVE mg/dL   Urobilinogen, UA 0.2 0.0 - 1.0 mg/dL   Nitrite NEGATIVE NEGATIVE   Leukocytes, UA NEGATIVE NEGATIVE  Urine rapid drug screen (hosp performed)     Status: Abnormal   Collection Time: 10/12/14  5:25 PM  Result Value Ref Range   Opiates POSITIVE (A) NONE DETECTED   Cocaine NONE DETECTED NONE DETECTED   Benzodiazepines NONE DETECTED NONE DETECTED   Amphetamines NONE DETECTED NONE DETECTED   Tetrahydrocannabinol NONE DETECTED NONE DETECTED   Barbiturates NONE DETECTED NONE DETECTED    Comment:        DRUG SCREEN FOR MEDICAL PURPOSES ONLY.  IF CONFIRMATION IS NEEDED FOR ANY PURPOSE, NOTIFY LAB WITHIN 5 DAYS.        LOWEST DETECTABLE LIMITS FOR URINE DRUG SCREEN Drug Class       Cutoff (ng/mL) Amphetamine      1000 Barbiturate      200 Benzodiazepine   672 Tricyclics       094 Opiates  300 Cocaine          300 THC              50   Urine microscopic-add on     Status: None   Collection Time: 10/12/14  5:25 PM  Result Value Ref Range   Squamous Epithelial / LPF RARE RARE   WBC, UA 0-2 <3 WBC/hpf   RBC / HPF 3-6 <3 RBC/hpf   Bacteria, UA RARE RARE  Glucose, capillary     Status: Abnormal   Collection Time: 10/12/14 10:15 PM  Result Value Ref Range   Glucose-Capillary 242 (H) 65 - 99 mg/dL  Basic metabolic panel     Status: Abnormal   Collection Time: 10/13/14  6:31 AM  Result Value Ref Range   Sodium 137 135 - 145 mmol/L   Potassium 4.1 3.5 - 5.1 mmol/L   Chloride 105 101 - 111 mmol/L   CO2 26 22 - 32 mmol/L   Glucose, Bld 162 (H) 65 - 99 mg/dL    BUN 16 6 - 20 mg/dL   Creatinine, Ser 1.06 0.61 - 1.24 mg/dL   Calcium 8.4 (L) 8.9 - 10.3 mg/dL   GFR calc non Af Amer >60 >60 mL/min   GFR calc Af Amer >60 >60 mL/min    Comment: (NOTE) The eGFR has been calculated using the CKD EPI equation. This calculation has not been validated in all clinical situations. eGFR's persistently <60 mL/min signify possible Chronic Kidney Disease.    Anion gap 6 5 - 15  CBC     Status: Abnormal   Collection Time: 10/13/14  6:31 AM  Result Value Ref Range   WBC 10.1 4.0 - 10.5 K/uL   RBC 3.00 (L) 4.22 - 5.81 MIL/uL   Hemoglobin 9.4 (L) 13.0 - 17.0 g/dL   HCT 28.8 (L) 39.0 - 52.0 %   MCV 96.0 78.0 - 100.0 fL   MCH 31.3 26.0 - 34.0 pg   MCHC 32.6 30.0 - 36.0 g/dL   RDW 14.2 11.5 - 15.5 %   Platelets 303 150 - 400 K/uL  Glucose, capillary     Status: Abnormal   Collection Time: 10/13/14  8:16 AM  Result Value Ref Range   Glucose-Capillary 170 (H) 65 - 99 mg/dL   Comment 1 Notify RN   Glucose, capillary     Status: Abnormal   Collection Time: 10/13/14 11:35 AM  Result Value Ref Range   Glucose-Capillary 123 (H) 65 - 99 mg/dL  Glucose, capillary     Status: Abnormal   Collection Time: 10/13/14  4:47 PM  Result Value Ref Range   Glucose-Capillary 305 (H) 65 - 99 mg/dL  Glucose, capillary     Status: Abnormal   Collection Time: 10/13/14  9:20 PM  Result Value Ref Range   Glucose-Capillary 178 (H) 65 - 99 mg/dL  Glucose, capillary     Status: Abnormal   Collection Time: 10/14/14  7:49 AM  Result Value Ref Range   Glucose-Capillary 266 (H) 65 - 99 mg/dL  Glucose, capillary     Status: Abnormal   Collection Time: 10/14/14 10:19 AM  Result Value Ref Range   Glucose-Capillary 303 (H) 65 - 99 mg/dL    Mr Foot Right W Wo Contrast  10/13/2014   CLINICAL DATA:  Diabetic patient status post amputation at the bases of the metatarsals of the right foot 09/20/2014. Status post fall several days ago with a right foot injury and wound dehiscence.  Redness, pain  and swelling.  EXAM: MRI OF THE RIGHT FOOT WITHOUT AND WITH CONTRAST  TECHNIQUE: Multiplanar, multisequence MR imaging was performed both before and after administration of intravenous contrast.  CONTRAST:  14 mL MULTIHANCE GADOBENATE DIMEGLUMINE 529 MG/ML IV SOLN  COMPARISON:  Plain films of the right foot 10/01/2014, 10/10/2014 and 10/12/2014.  FINDINGS: Postoperative change of amputation at the level the bases of all metatarsals is identified. There is a large open wound at the surgical site eccentric medially. The wound is centered over the remaining first, second and third metatarsals. In the soft tissues deep to the lateral margin of the wound, a skin ulceration is seen along the lateral margin of the remaining fifth metatarsal with locules of gas in soft tissues deep to the ulceration. There is a thin collection of fluid versus granulation tissue tracking deep to this wound laterally at the level of the fifth metatarsal.  Intense marrow edema and enhancement are seen throughout all of the metatarsal remnants. Less intense marrow edema and enhancement are also seen in the periphery of the cuboid. A mild degree of edema and enhancement are noted in the calcaneus centered at the peroneal tubercle.  IMPRESSION: Status post amputation at the level the bases of metatarsals. Intense marrow edema and enhancement throughout all the metatarsals remnants is consistent with osteomyelitis. Milder degree of edema and enhancement in the lateral cuboid is also worrisome for osteomyelitis. Very mild edema and enhancement in the calcaneus could be secondary to reactive change or osteomyelitis.  Likely small soft tissue abscess lateral to the patient's large open wound.  Skin ulceration over the base of fifth metatarsal. Soft tissues deep to the ulceration and locules of gas.   Electronically Signed   By: Inge Rise M.D.   On: 10/13/2014 08:23    Review of Systems  All other systems reviewed and are  negative.  Blood pressure 133/76, pulse 50, temperature 98.2 F (36.8 C), temperature source Oral, resp. rate 18, height 5' 10" (1.778 m), weight 69.854 kg (154 lb), SpO2 100 %. Physical Exam On examination patient has blisters dorsally over the right ankle. Patient has complete dehiscence of the wound with the wound approximate 5 x 7 cm and this is open all the way down to the metatarsals. There is necrotic tissue at the base of the wound. Assessment/Plan: Assessment uncontrolled diabetic with uncontrolled psychiatric issues with dehiscence and gangrenous changes of the right transmetatarsal amputation.  Plan: Discussed with the patient that we will need to proceed with a transtibial amputation. Patient states he understands I will try to see if this can be scheduled on Monday or Tuesday. Patient will need discharge to skilled nursing.  Kaede Clendenen V 10/14/2014, 3:31 PM

## 2014-10-15 ENCOUNTER — Other Ambulatory Visit (HOSPITAL_COMMUNITY): Payer: Self-pay | Admitting: Orthopedic Surgery

## 2014-10-15 DIAGNOSIS — R45851 Suicidal ideations: Secondary | ICD-10-CM

## 2014-10-15 DIAGNOSIS — F330 Major depressive disorder, recurrent, mild: Secondary | ICD-10-CM

## 2014-10-15 LAB — CBC
HEMATOCRIT: 29.3 % — AB (ref 39.0–52.0)
Hemoglobin: 9.4 g/dL — ABNORMAL LOW (ref 13.0–17.0)
MCH: 31.4 pg (ref 26.0–34.0)
MCHC: 32.1 g/dL (ref 30.0–36.0)
MCV: 98 fL (ref 78.0–100.0)
Platelets: 338 10*3/uL (ref 150–400)
RBC: 2.99 MIL/uL — ABNORMAL LOW (ref 4.22–5.81)
RDW: 14.4 % (ref 11.5–15.5)
WBC: 11.6 10*3/uL — AB (ref 4.0–10.5)

## 2014-10-15 LAB — BASIC METABOLIC PANEL
ANION GAP: 7 (ref 5–15)
BUN: 19 mg/dL (ref 6–20)
CO2: 26 mmol/L (ref 22–32)
Calcium: 8.7 mg/dL — ABNORMAL LOW (ref 8.9–10.3)
Chloride: 106 mmol/L (ref 101–111)
Creatinine, Ser: 1.33 mg/dL — ABNORMAL HIGH (ref 0.61–1.24)
GFR calc non Af Amer: 58 mL/min — ABNORMAL LOW (ref >60)
Glucose, Bld: 62 mg/dL — ABNORMAL LOW (ref 65–99)
Potassium: 3.7 mmol/L (ref 3.5–5.1)
SODIUM: 139 mmol/L (ref 135–145)

## 2014-10-15 LAB — VANCOMYCIN, TROUGH: VANCOMYCIN TR: 22 ug/mL — AB (ref 10.0–20.0)

## 2014-10-15 LAB — GLUCOSE, CAPILLARY
GLUCOSE-CAPILLARY: 247 mg/dL — AB (ref 65–99)
GLUCOSE-CAPILLARY: 93 mg/dL (ref 65–99)
Glucose-Capillary: 87 mg/dL (ref 65–99)
Glucose-Capillary: 223 mg/dL — ABNORMAL HIGH (ref 65–99)

## 2014-10-15 MED ORDER — SODIUM CHLORIDE 0.9 % IV SOLN
INTRAVENOUS | Status: DC
  Administered 2014-10-15: 09:00:00 via INTRAVENOUS
  Administered 2014-10-15: 75 mL/h via INTRAVENOUS
  Administered 2014-10-16: 14:00:00 via INTRAVENOUS
  Administered 2014-10-17: 75 mL/h via INTRAVENOUS

## 2014-10-15 MED ORDER — VANCOMYCIN HCL IN DEXTROSE 750-5 MG/150ML-% IV SOLN
750.0000 mg | Freq: Two times a day (BID) | INTRAVENOUS | Status: DC
  Administered 2014-10-16 – 2014-10-18 (×5): 750 mg via INTRAVENOUS
  Filled 2014-10-15 (×6): qty 150

## 2014-10-15 NOTE — Clinical Social Work Psych Assess (Signed)
Clinical Social Work Librarian, academic  Clinical Social Worker:  [place], [place] Date/Time:  10/15/2014, 4:20 PM Referred By:  Physician Date Referred:  10/15/14 Reason for Referral:  Behavioral Health Issues   Presenting Symptoms/Problems  Presenting Symptoms/Problems(in person's/family's own words):  Psych consulted for medication management.   Abuse/Neglect/Trauma History  Abuse/Neglect/Trauma History:  Denies History Abuse/Neglect/Trauma History Comments (indicate dates):  N/A   Psychiatric History  Psychiatric History:  Outpatient Treatment, Inpatient/Hospitalization Psychiatric Medication:  N/A   Current Mental Health Hospitalizations/Previous Mental Health History:  Patient reports previous hospitalization for SI.    Current Provider:  Hans Eden and Date:    Current Medications:   Scheduled Meds: . ARIPiprazole  5 mg Oral Daily  . folic acid  1 mg Oral Daily  . gabapentin  300 mg Oral TID  . heparin subcutaneous  5,000 Units Subcutaneous 3 times per day  . insulin aspart  0-15 Units Subcutaneous TID WC  . insulin aspart protamine- aspart  20 Units Subcutaneous BID WC  . multivitamin with minerals  1 tablet Oral Daily  . piperacillin-tazobactam (ZOSYN)  IV  3.375 g Intravenous Q8H  . sertraline  50 mg Oral Daily  . thiamine  100 mg Oral Daily   Or  . thiamine  100 mg Intravenous Daily  . vancomycin  1,000 mg Intravenous Q12H   Continuous Infusions: . sodium chloride 75 mL/hr at 10/15/14 0900   PRN Meds:.HYDROcodone-acetaminophen, LORazepam **OR** LORazepam, morphine injection, ondansetron **OR** ondansetron (ZOFRAN) IV, traZODone     Previous Inpatient Admission/Date/Reason:  [facility] in the past   Emotional Health/Current Symptoms  Suicide/Self Harm: None Reported Suicide Attempt in Past (date/description):  No SI or HI.  Other Harmful Behavior (ex. homicidal ideation) (describe):  None reported   Psychotic/Dissociative  Symptoms  Psychotic/Dissociative Symptoms: None Reported Other Psychotic/Dissociative Symptoms:  N/A   Attention/Behavioral Symptoms  Attention/Behavioral Symptoms: Within Normal Limits Other Attention/Behavioral Symptoms:  N/A   Cognitive Impairment  Cognitive Impairment:  Within Normal Limits Other Cognitive Impairment:  N/A   Mood and Adjustment  Mood and Adjustment:  Mood Congruent   Stress, Anxiety, Trauma, Any Recent Loss/Stressor  Stress, Anxiety, Trauma, Any Recent Loss/Stressor: Other - See Comment Anxiety (frequency):  N/A  Phobia (specify):  N/A  Compulsive Behavior (specify):  N/A  Obsessive Behavior (specify):  N/A  Other Stress, Anxiety, Trauma, Any Recent Loss/Stressor:  Patient is currently homeless.   Substance Abuse/Use  Substance Abuse/Use: None SBIRT Completed (please refer for detailed history): No Self-reported Substance Use (last use and frequency):  None reported  Urinary Drug Screen Completed: No Alcohol Level:  <5   Environment/Housing/Living Arrangement  Environmental/Housing/Living Arrangement: Homeless Who is in the Home:  N/A  Emergency Contact:  Brother   Tax inspector: [employer]   Patient's Strengths and Goals  Patient's Strengths and Goals (patient's own words):  Patient reports he is feeling less depressed and will be compliant with treatment after hospitalization.    Clinical Social Worker's Interpretive Summary  Clinical Social Workers Interpretive Summary:  CSW rounded with psych MD to evaluate patient. Patient reports he is trying to process medical needs and is aware that surgery is needed on leg. Patient reports he left SNF AMA but realizes this was a mistake and wants help at [facility]. Patient reports that his depression appears to be improving and denies any SI or HI. Patient reports several psychosocial stressors and is worried about being homeless. Patient reports he stayed at [facility] parking lot for  over 1 week but was unable to obtain a bed. Patient reports that he is hopeful to get more help from the hospital at [place].   Patient engaged during assessment with appropriate eye contact and affect. Patient reports that he will remain complaint with psychotropic medications as well. Psych MD has cleared patient for SNF placement if needed at [facility].   Disposition  Disposition: Outpatient Referral Made/Needed

## 2014-10-15 NOTE — Consult Note (Signed)
Psychiatry Consult Follow Up  Reason for Consult:  Needs medication for Major depressive disorder, recurrent and anxiety Referring Physician: Dr. Sharma Covert Patient Identification: Tyler Burgess MRN:  [id number] Principal Diagnosis: MDD-recurrent moderate, Generalized anxiety disorder Diagnosis:   Patient Active Problem List   Diagnosis Date Noted  . Major depressive disorder, recurrent episode, mild [F33.0]   . Wound infection [T79.8XXA] 10/12/2014  . Depression [F32.9]   . Diabetic ketoacidosis without coma associated with type 2 diabetes mellitus [E13.10]   . Weakness [R53.1]   . DKA (diabetic ketoacidoses) [E13.10] 09/15/2014  . Hyponatremia [E87.1] 09/15/2014  . DKA, type 2, not at goal [E13.10] 09/15/2014  . Suicidal ideations [R45.851]   . Diarrhea [R19.7]   . Enteritis due to Clostridium difficile [A04.7] 09/05/2014  . Osteomyelitis [M86.9] 08/31/2014  . Sepsis [A41.9] 08/31/2014  . Substance abuse [F19.10]   . Suicidal ideation [R45.851]   . Type 2 diabetes mellitus with hyperglycemia [E11.65] 04/15/2014  . Suicidal behavior [F48.9] 04/11/2014  . 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 GAD (generalized anxiety disorder) [F41.1] 04/11/2014  . Alcohol use disorder, severe, dependence [F10.20] 04/11/2014  . Opioid use disorder, moderate, in sustained remission [F11.90] 04/11/2014  . Opioid dependence in remission [F11.21] 01/10/2014  . Severe major depression without psychotic features [F32.2] 01/09/2014  . 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 Type 2 diabetes mellitus with peripheral neuropathy [E11.40] 12/11/2013  . 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 male patient admitted with post surgical foot infection .  HPI:  Patient is  59 year old male with history of diabetes, depression and anxiety. Thanks for asking me  to evaluate the patient for medication management. Patient reports that he has not been taking his mental health medications regularly for the past one month. However, he reports that his medications helped when he was compliant and would like to get back on them. He reports vague depressive symptoms, anxiety, excessive worries, difficulty sleeping, apprehension and poor concentration. He is currently homeless, in poor medical health and poor financial state. Patient reports history of alcohol abuse but not in the past few weeks. Currently, he denies suicidal ideations, psychosis or delusional thinking.  Interval History: Patient seen this morning with the psychiatric social service for psychiatric consultation and follow-up. Patient reported he has been compliant with his psychiatric medication and has no reported adverse effects. Patient stated he started feeling better emotionally and also satisfied with the the plan of going for surgery which include below knee amputation. Patient is hoping he will be able to refer to the skilled nursing facility and rehabilitation after that. Patient denied current suicidal/homicidal ideation, intention or plans. Patient has no evidence of psychosis.  Past Medical History:  Past Medical History  Diagnosis Date  . Diabetes mellitus without complication   . Depression     Past Surgical History  Procedure Laterality Date  . Right arm    . Cholecystectomy    . Amputation toe Right 09/03/2014    Procedure: AMPUTATION RIGHT GREAT TOE AND SECOND TOE;  Surgeon: Aviva Signs Md, MD;  Location: [facility];  Service: General;  Laterality: Right;  right great and second toes  . Amputation Right 09/20/2014    Procedure: RIGHT TRANSMETATARSAL AMPUTATION;  Surgeon: Newt Minion, MD;  Location: [facility];  Service: Orthopedics;  Laterality: Right;   Family History:  Family History  Problem Relation Age of Onset  . Diabetes Mother   . Depression Father   . Depression Brother   .  Diabetes Other   . Depression Other    Social History:  History  Alcohol Use  . Yes    Comment: once per month     History  Drug Use No    History   Social History  . Marital Status: Widowed    Spouse Name: N/A  . Number of Children: N/A  . Years of Education: N/A   Social History Main Topics  . Smoking status: Current Every Day Smoker -- 0.50 packs/day for 35 years    Types: Cigarettes  . Smokeless tobacco: Never Used  . Alcohol Use: Yes     Comment: once per month  . Drug Use: No  . Sexual Activity: No   Other Topics Concern  . None   Social History Narrative   Homeless and supposed to go to a Shelter in [place].  [facility] could not get medications right and so he took a loaded pistol into the center and had to be jailed for 9 days.  Gets around in a truck.  Lives in his truck and occasionally visits his brother.     Additional Social History:                          Allergies:  No Known Allergies  Labs:  Results for orders placed or performed during the hospital encounter of 10/12/14 (from the past 48 hour(s))  Glucose, capillary     Status: Abnormal   Collection Time: 10/13/14 11:35 AM  Result Value Ref Range   Glucose-Capillary 123 (H) 65 - 99 mg/dL  Glucose, capillary     Status: Abnormal   Collection Time: 10/13/14  4:47 PM  Result Value Ref Range   Glucose-Capillary 305 (H) 65 - 99 mg/dL  Glucose, capillary     Status: Abnormal   Collection Time: 10/13/14  9:20 PM  Result Value Ref Range   Glucose-Capillary 178 (H) 65 - 99 mg/dL  Glucose, capillary     Status: Abnormal   Collection Time: 10/14/14  7:49 AM  Result Value Ref Range   Glucose-Capillary 266 (H) 65 - 99 mg/dL  Glucose, capillary     Status: Abnormal   Collection Time: 10/14/14 10:19 AM  Result Value Ref Range   Glucose-Capillary 303 (H) 65 - 99 mg/dL  Glucose, capillary     Status: Abnormal   Collection Time: 10/14/14  4:54 PM  Result Value Ref Range    Glucose-Capillary 274 (H) 65 - 99 mg/dL  Glucose, capillary     Status: Abnormal   Collection Time: 10/14/14  9:21 PM  Result Value Ref Range   Glucose-Capillary 190 (H) 65 - 99 mg/dL   Comment 1 Notify RN   CBC     Status: Abnormal   Collection Time: 10/15/14  4:58 AM  Result Value Ref Range   WBC 11.6 (H) 4.0 - 10.5 K/uL   RBC 2.99 (L) 4.22 - 5.81 MIL/uL   Hemoglobin 9.4 (L) 13.0 - 17.0 g/dL   HCT 29.3 (L) 39.0 - 52.0 %   MCV 98.0 78.0 - 100.0 fL   MCH 31.4 26.0 - 34.0 pg   MCHC 32.1 30.0 - 36.0 g/dL   RDW 14.4 11.5 - 15.5 %   Platelets 338 150 - 400 K/uL  Basic metabolic panel     Status: Abnormal  Collection Time: 10/15/14  4:58 AM  Result Value Ref Range   Sodium 139 135 - 145 mmol/L   Potassium 3.7 3.5 - 5.1 mmol/L   Chloride 106 101 - 111 mmol/L   CO2 26 22 - 32 mmol/L   Glucose, Bld 62 (L) 65 - 99 mg/dL   BUN 19 6 - 20 mg/dL   Creatinine, Ser 1.33 (H) 0.61 - 1.24 mg/dL   Calcium 8.7 (L) 8.9 - 10.3 mg/dL   GFR calc non Af Amer 58 (L) >60 mL/min   GFR calc Af Amer >60 >60 mL/min    Comment: (NOTE) The eGFR has been calculated using the CKD EPI equation. This calculation has not been validated in all clinical situations. eGFR's persistently <60 mL/min signify possible Chronic Kidney Disease.    Anion gap 7 5 - 15  Glucose, capillary     Status: None   Collection Time: 10/15/14  7:20 AM  Result Value Ref Range   Glucose-Capillary 87 65 - 99 mg/dL    Vitals: Blood pressure 137/95, pulse 57, temperature 98.2 F (36.8 C), temperature source Oral, resp. rate 18, height 5' 10"  (1.778 m), weight 75.07 kg (165 lb 8 oz), SpO2 100 %.  Risk to Self: Is patient at risk for suicide?: No Risk to Others:   Prior Inpatient Therapy:   Prior Outpatient Therapy:    Current Facility-Administered Medications  Medication Dose Route Frequency Provider Last Rate Last Dose  . 0.9 %  sodium chloride infusion   Intravenous Continuous Jonetta Osgood, MD 75 mL/hr at 10/15/14 0900     . ARIPiprazole (ABILIFY) tablet 5 mg  5 mg Oral Daily Mojeed Akintayo   5 mg at 10/15/14 0900  . folic acid (FOLVITE) tablet 1 mg  1 mg Oral Daily Phillips Grout, MD   1 mg at 10/15/14 0900  . gabapentin (NEURONTIN) capsule 300 mg  300 mg Oral TID Jonetta Osgood, MD   300 mg at 10/15/14 0900  . heparin injection 5,000 Units  5,000 Units Subcutaneous 3 times per day Erline Hau, MD   5,000 Units at 10/15/14 [phone]  . HYDROcodone-acetaminophen (NORCO/VICODIN) 5-325 MG per tablet 1-2 tablet  1-2 tablet Oral Q4H PRN Phillips Grout, MD   2 tablet at 10/15/14 0859  . insulin aspart (novoLOG) injection 0-15 Units  0-15 Units Subcutaneous TID WC Jonetta Osgood, MD   8 Units at 10/14/14 1750  . insulin aspart protamine- aspart (NOVOLOG MIX 70/30) injection 20 Units  20 Units Subcutaneous BID WC Jonetta Osgood, MD   20 Units at 10/15/14 0859  . LORazepam (ATIVAN) tablet 1 mg  1 mg Oral Q6H PRN Phillips Grout, MD       Or  . LORazepam (ATIVAN) injection 1 mg  1 mg Intravenous Q6H PRN Phillips Grout, MD   1 mg at 10/15/14 0314  . morphine 2 MG/ML injection 2 mg  2 mg Intravenous Q4H PRN Jonetta Osgood, MD   2 mg at 10/15/14 0807  . multivitamin with minerals tablet 1 tablet  1 tablet Oral Daily Phillips Grout, MD   1 tablet at 10/15/14 0900  . ondansetron (ZOFRAN) tablet 4 mg  4 mg Oral Q6H PRN Phillips Grout, MD       Or  . ondansetron (ZOFRAN) injection 4 mg  4 mg Intravenous Q6H PRN Phillips Grout, MD      . piperacillin-tazobactam (ZOSYN) IVPB 3.375 g  3.375 g Intravenous  Q8H Orpah Greek, MD   3.375 g at 10/15/14 0654  . sertraline (ZOLOFT) tablet 50 mg  50 mg Oral Daily Mojeed Akintayo   50 mg at 10/15/14 0900  . thiamine (VITAMIN B-1) tablet 100 mg  100 mg Oral Daily Phillips Grout, MD   100 mg at 10/15/14 0900   Or  . thiamine (B-1) injection 100 mg  100 mg Intravenous Daily Phillips Grout, MD      . traZODone (DESYREL) tablet 50 mg  50 mg Oral QHS PRN Mojeed  Akintayo   50 mg at 10/15/14 0034  . vancomycin (VANCOCIN) IVPB 1000 mg/200 mL premix  1,000 mg Intravenous Q12H Orpah Greek, MD   1,000 mg at 10/15/14 0540    Musculoskeletal: Strength & Muscle Tone: within normal limits Gait & Station: normal Patient leans: N/A  Psychiatric Specialty Exam: Physical Exam  Review of Systems  Constitutional: Negative.   HENT: Negative.   Eyes: Negative.   Respiratory: Negative.   Cardiovascular: Negative.   Gastrointestinal: Negative.   Genitourinary: Negative.   Musculoskeletal:       S/p left foot amputation two weeks ago.  Skin: Negative.   Neurological: Negative.   Endo/Heme/Allergies: Negative.     Blood pressure 137/95, pulse 57, temperature 98.2 F (36.8 C), temperature source Oral, resp. rate 18, height 5' 10"  (1.778 m), weight 75.07 kg (165 lb 8 oz), SpO2 100 %.Body mass index is 23.75 kg/(m^2).  General Appearance: Casual  Eye Contact::  Good  Speech:  Clear and Coherent and Normal Rate  Volume:  Normal  Mood:  Anxious, Depressed and Hopeless  Affect:  Appropriate and Congruent  Thought Process:  Coherent, Goal Directed and Intact  Orientation:  Full (Time, Place, and Person)  Thought Content:  WDL  Suicidal Thoughts:  No  Homicidal Thoughts:  No  Memory:  Immediate;   Good Recent;   Good Remote;   Good  Judgement:  Fair  Insight:  Fair and Shallow  Psychomotor Activity:  Normal  Concentration:  Fair  Recall:  NA  Fund of Knowledge:Fair  Language: Good  Akathisia:  NA  Handed:  Right  AIMS (if indicated):     Assets:  Desire for Improvement  ADL's:  Intact  Cognition: WNL  Sleep:   poor   Medical Decision Making: Established Problem, Stable/Improving (1), Review or order clinical lab tests (1), Review or order medicine tests (1), Review of Medication Regimen & Side Effects (2) and Review of New Medication or Change in Dosage (2)  Treatment Plan Summary: Daily contact with patient to assess and evaluate  symptoms and progress in treatment. Medication management: Continue Abilify 5 mg po daily for depressed mood, Zoloft 50 mg po daily for depression,  Continue Cogentin 0.5 mg po daily for EPS and Trazodone 108m Qhs prn insomnia.  Disposition: Please refer patient to outpatient psychiatric service upon discharge.  JDurward Parcel,MD 10/15/2014 10:14 AM

## 2014-10-15 NOTE — Progress Notes (Signed)
ANTIBIOTIC CONSULT NOTE - follow up  Pharmacy Consult for Vancomycin and Zosyn Indication: OM/wound infection  No Known Allergies  Patient Measurements: Height: 5\' 10"  (177.8 cm) Weight: 165 lb 8 oz (75.07 kg) IBW/kg (Calculated) : 73  Vital Signs: Temp: 97.5 F (36.4 C) (06/20 0626) Temp Source: Oral (06/20 0626) BP: 123/66 mmHg (06/20 0626) Pulse Rate: 49 (06/20 0626) Intake/Output from previous day: 06/19 0701 - 06/20 0700 In: 1380 [P.O.:1080; IV Piggyback:300] Out: 2125 [Urine:2125] Intake/Output from this shift:    Labs:  Recent Labs  10/12/14 1359 10/13/14 0631 10/15/14 0458  WBC 12.6* 10.1 11.6*  HGB 10.1* 9.4* 9.4*  PLT 360 303 338  CREATININE 1.27* 1.06 1.33*   Estimated Creatinine Clearance: 63.3 mL/min (by C-G formula based on Cr of 1.33). No results for input(s): VANCOTROUGH, VANCOPEAK, VANCORANDOM, GENTTROUGH, GENTPEAK, GENTRANDOM, TOBRATROUGH, TOBRAPEAK, TOBRARND, AMIKACINPEAK, AMIKACINTROU, AMIKACIN in the last 72 hours.   Microbiology: Recent Results (from the past 720 hour(s))  Surgical pcr screen     Status: None   Collection Time: 09/20/14  4:43 AM  Result Value Ref Range Status   MRSA, PCR NEGATIVE NEGATIVE Final   Staphylococcus aureus NEGATIVE NEGATIVE Final    Comment:        The Xpert SA Assay (FDA approved for NASAL specimens in patients over 50 years of age), is one component of a comprehensive surveillance program.  Test performance has been validated by [facility] for patients greater than or equal to 67 year old. It is not intended to diagnose infection nor to guide or monitor treatment.   Culture, blood (routine x 2)     Status: None   Collection Time: 09/25/14  7:50 AM  Result Value Ref Range Status   Specimen Description BLOOD RIGHT ARM  Final   Special Requests BOTTLES DRAWN AEROBIC ONLY 5CC  Final   Culture   Final    NO GROWTH 5 DAYS Performed at [facility]    Report Status 10/01/2014 FINAL  Final   Culture, blood (routine x 2)     Status: None   Collection Time: 09/25/14  8:00 AM  Result Value Ref Range Status   Specimen Description BLOOD RIGHT ARM  Final   Special Requests BOTTLES DRAWN AEROBIC ONLY 5CC  Final   Culture   Final    NO GROWTH 5 DAYS Performed at [facility]    Report Status 10/01/2014 FINAL  Final  Culture, blood (routine x 2)     Status: None (Preliminary result)   Collection Time: 10/12/14  2:04 PM  Result Value Ref Range Status   Specimen Description BLOOD LEFT ARM  Final   Special Requests Normal 10CC EACH BOTTLE  Final   Culture PENDING  Incomplete   Report Status PENDING  Incomplete  Culture, blood (routine x 2)     Status: None (Preliminary result)   Collection Time: 10/12/14  2:08 PM  Result Value Ref Range Status   Specimen Description BLOOD RIGHT ARM  Final   Special Requests   Final    Normal BOTTLES DRAWN AEROBIC AND ANAEROBIC 9 CC EACH BOTTLE   Culture PENDING  Incomplete   Report Status PENDING  Incomplete   Medical History: Past Medical History  Diagnosis Date  . Diabetes mellitus without complication   . Depression    Assessment: 59yo male with worsening pain and swelling of right foot.  Pt is s/p partial amputation in May. SCr is slightly elevated. Pharmacy consult for vancomycin and zosyn  on 6/17. Plan is for amputation tomorrow 6/21. Scr rising, Afebrile, WBC up slightly  6/17 >> vanc >> 6/17 >> Zosyn >>  Goal of Therapy:  vanc trough 15-20 mcg/mL  Plan:  1) Continue vancomycin 1g q12 for now 2) Check vanc trough tonight prior to 6pm dose 3) Continue current Zosyn dosing 4) After surgery tomorrow, will need assessment of whether antibiotics still needed   Hessie Knows, PharmD, BCPS Pager [phone] 10/15/2014 9:54 AM

## 2014-10-15 NOTE — Progress Notes (Signed)
PATIENT DETAILS Name: Tyler Burgess Age: 59 y.o. Sex: male Date of Birth: 30-Aug-1955 Admit Date: 10/12/2014 Admitting Physician Haydee Monica, MD PCP:No PCP Per Patient  Subjective: Pain in the right foot area. Claims that his mood is stable-and denies any suicidal or homicidal ideation. Endorses frustration regarding being homeless with medical issues. Unable to afford either insulin or any of his psychiatric medications.  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 Await blood cultures. Amputation scheduled for tomorrow 10/16/14.  Active Problems: Type 2 diabetes mellitus with peripheral neuropathy: Uncontrolled, last A1c 13.0 on 09/16/14. Continue Insulin 70/30 20 units BID-since just started on 6/19-will watch CBG for one more day-before making any further adjustments.  Will start oral hypoglycemic agents when close at discharge. Resume Neurontin for peripheral neuropathy.   Anemia: Likely secondary to chronic disease. His hemoglobin remains the same, 9.4, as yesterday. Hb stable, Follow.  Depression: Denies any suicidal or homicidal ideation. Claims unable to afford any of his psychiatric medications-therefore noncompliant. Seen by Psych-prior regimen of  Abilify  po daily and Zoloft  po daily has been resumed.   Alcohol abuse: Currently awake and alert. Continue Ativan per [facility] protocol  Homelessness:social worker eval  Disposition: Remain inpatient-SNF on discharge  Antimicrobial agents  See below  Anti-infectives    Start     Dose/Rate Route Frequency Ordered Stop   10/13/14 0400  vancomycin (VANCOCIN) IVPB 1000 mg/200 mL premix     1,000 mg 200 mL/hr over 60 Minutes Intravenous Every 12 hours 10/12/14 1817     10/12/14 2200  piperacillin-tazobactam (ZOSYN) IVPB 3.375 g     3.375 g 12.5 mL/hr over 240 Minutes  Intravenous Every 8 hours 10/12/14 1817     10/12/14 1430  vancomycin (VANCOCIN) 1,500 mg in sodium chloride 0.9 % 500 mL IVPB     1,500 mg 250 mL/hr over 120 Minutes Intravenous  Once 10/12/14 1406 10/12/14 1705   10/12/14 1415  piperacillin-tazobactam (ZOSYN) IVPB 3.375 g     3.375 g 100 mL/hr over 30 Minutes Intravenous STAT 10/12/14 1406 10/12/14 1505      DVT Prophylaxis: Prophylactic Heparin  Code Status: Full code   Family Communication None at bedside  Procedures: None  CONSULTS:  orthopedic surgery  Time spent 35 minutes-Greater than 50% of this time was spent in counseling, explanation of diagnosis, planning of further management, and coordination of care.  MEDICATIONS: Scheduled Meds: . ARIPiprazole  5 mg Oral Daily  . folic acid  1 mg Oral Daily  . gabapentin  300 mg Oral TID  . heparin subcutaneous  5,000 Units Subcutaneous 3 times per day  . insulin aspart  0-15 Units Subcutaneous TID WC  . insulin aspart protamine- aspart  20 Units Subcutaneous BID WC  . multivitamin with minerals  1 tablet Oral Daily  . piperacillin-tazobactam (ZOSYN)  IV  3.375 g Intravenous Q8H  . sertraline  50 mg Oral Daily  . thiamine  100 mg Oral Daily   Or  . thiamine  100 mg Intravenous Daily  . vancomycin  1,000 mg Intravenous Q12H   Continuous Infusions: . sodium chloride 75 mL/hr at 10/15/14 0900   PRN Meds:.HYDROcodone-acetaminophen, LORazepam **OR** LORazepam, morphine injection, ondansetron **OR** ondansetron (ZOFRAN) IV, traZODone    PHYSICAL EXAM: Vital signs in last 24 hours: Filed Vitals:   10/14/14 2154  10/15/14 0000 10/15/14 0626 10/15/14 1000  BP: 128/78 136/79 123/66 137/95  Pulse: 62 59 49 57  Temp: 98 F (36.7 C)  97.5 F (36.4 C) 98.2 F (36.8 C)  TempSrc: Oral  Oral Oral  Resp: Height:      Weight:   75.07 kg (165 lb 8 oz)   SpO2: 100%  100%     Weight change: 5.216 kg (11 lb 8 oz) Filed Weights   10/13/14 0617 10/14/14 0500  10/15/14 0626  Weight: 70.035 kg (154 lb 6.4 oz) 69.854 kg (154 lb) 75.07 kg (165 lb 8 oz)   Body mass index is 23.75 kg/(m^2).   Gen Exam: Awake and alert with clear speech. Neck: Supple, No JVD.   Chest: B/L Clear.   CVS: S1 S2 Regular, no murmurs.  Abdomen: soft, BS +, non tender, non distended.  Extremities: no edema, lower extremities warm to touch. right lower extremity bandaged-did not open Neurologic: Non Focal.   Skin: No Rash.   Wounds: R foot wound  Intake/Output from previous day:  Intake/Output Summary (Last 24 hours) at 10/15/14 1123 Last data filed at 10/15/14 0654  Gross per 24 hour  Intake   1140 ml  Output   2125 ml  Net   -985 ml     LAB RESULTS: CBC  Recent Labs Lab 10/12/14 1359 10/13/14 0631 10/15/14 0458  WBC 12.6* 10.1 11.6*  HGB 10.1* 9.4* 9.4*  HCT 30.4* 28.8* 29.3*  PLT 360 303 338  MCV 95.9 96.0 98.0  MCH 31.9 31.3 31.4  MCHC 33.2 32.6 32.1  RDW 13.9 14.2 14.4  LYMPHSABS 1.6  --   --   MONOABS 0.8  --   --   EOSABS 0.1  --   --   BASOSABS 0.1  --   --     Chemistries   Recent Labs Lab 10/12/14 1359 10/13/14 0631 10/15/14 0458  NA 131* 137 139  K 4.8 4.1 3.7  CL 96* 105 106  CO2 GLUCOSE 526* 162* 62*  BUN CREATININE 1.27* 1.06 1.33*  CALCIUM 8.5* 8.4* 8.7*    CBG:  Recent Labs Lab 10/14/14 1019 10/14/14 1654 10/14/14 2121 10/15/14 0720 10/15/14 1042  GLUCAP 303* 274* 190* 87 247*    GFR Estimated Creatinine Clearance: 63.3 mL/min (by C-G formula based on Cr of 1.33).   MICROBIOLOGY: Recent Results (from the past 240 hour(s))  Culture, blood (routine x 2)     Status: None (Preliminary result)   Collection Time: 10/12/14  2:04 PM  Result Value Ref Range Status   Specimen Description BLOOD LEFT ARM  Final   Special Requests Normal 10CC EACH BOTTLE  Final   Culture PENDING  Incomplete   Report Status PENDING  Incomplete  Culture, blood (routine x 2)     Status: None (Preliminary  result)   Collection Time: 10/12/14  2:08 PM  Result Value Ref Range Status   Specimen Description BLOOD RIGHT ARM  Final   Special Requests   Final    Normal BOTTLES DRAWN AEROBIC AND ANAEROBIC 9 CC EACH BOTTLE   Culture PENDING  Incomplete   Report Status PENDING  Incomplete    RADIOLOGY STUDIES/RESULTS: Ct Head Wo Contrast  09/22/2014   CLINICAL DATA:  Code stroke. Right arm numbness and weakness, acute onset. Initial encounter.  EXAM: CT HEAD WITHOUT CONTRAST  TECHNIQUE: Contiguous axial images were obtained from the base of the  skull through the vertex without intravenous contrast.  COMPARISON:  None.  FINDINGS: There is no evidence of acute infarction, mass lesion, or intra- or extra-axial hemorrhage on CT.  Prominence of the ventricles and sulci reflects mild cortical volume loss. Mild periventricular white matter change likely reflects small vessel ischemic microangiopathy.  The brainstem and fourth ventricle are within normal limits. The basal ganglia are unremarkable in appearance. The cerebral hemispheres demonstrate grossly normal gray-white differentiation. No mass effect or midline shift is seen.  There is no evidence of fracture; visualized osseous structures are unremarkable in appearance. The orbits are within normal limits. The paranasal sinuses and mastoid air cells are well-aerated. No significant soft tissue abnormalities are seen.  IMPRESSION: 1. No acute intracranial pathology seen on CT. 2. Mild cortical volume loss and scattered small vessel ischemic microangiopathy.  These results were called by telephone at the time of interpretation on 09/22/2014 at 5:37 pm to Dr. Susa Raring, who verbally acknowledged these results.   Electronically Signed   By: Roanna Raider M.D.   On: 09/22/2014 17:37   Mr Brain Wo Contrast  09/23/2014   CLINICAL DATA:  RIGHT-sided weakness and slurred speech beginning Sep 22, 2014. Status post RIGHT foot partial amputation Sep 20, 2014. History of  diabetes, severe alcohol use, osteomyelitis.  EXAM: MRI HEAD WITHOUT CONTRAST  TECHNIQUE: Multiplanar, multiecho pulse sequences of the brain and surrounding structures were obtained without intravenous contrast.  COMPARISON:  CT head Sep 22, 2014  FINDINGS: Mild proportional prominence of the ventricles and sulci for age. No abnormal parenchymal signal, mass lesions, mass effect. No reduced diffusion to suggest acute ischemia. No susceptibility artifact to suggest hemorrhage.  No abnormal extra-axial fluid collections. No extra-axial masses though, contrast enhanced sequences would be more sensitive. Normal major intracranial vascular flow voids seen at the skull base.  Ocular globes and orbital contents are unremarkable though not tailored for evaluation. No abnormal sellar expansion. Mild paranasal sinus mucosal thickening. The mastoid air cells are well aerated. No suspicious calvarial bone marrow signal. No abnormal sellar expansion. Craniocervical junction maintained.  IMPRESSION: No acute intracranial process; specifically no acute ischemia.  Mild global parenchymal brain volume loss for age.   Electronically Signed   By: Awilda Metro M.D.   On: 09/23/2014 02:09   Mr Foot Right W Wo Contrast  10/13/2014   CLINICAL DATA:  Diabetic patient status post amputation at the bases of the metatarsals of the right foot 09/20/2014. Status post fall several days ago with a right foot injury and wound dehiscence. Redness, pain and swelling.  EXAM: MRI OF THE RIGHT FOOT WITHOUT AND WITH CONTRAST  TECHNIQUE: Multiplanar, multisequence MR imaging was performed both before and after administration of intravenous contrast.  CONTRAST:  14 mL MULTIHANCE GADOBENATE DIMEGLUMINE 529 MG/ML IV SOLN  COMPARISON:  Plain films of the right foot 10/01/2014, 10/10/2014 and 10/12/2014.  FINDINGS: Postoperative change of amputation at the level the bases of all metatarsals is identified. There is a large open wound at the surgical  site eccentric medially. The wound is centered over the remaining first, second and third metatarsals. In the soft tissues deep to the lateral margin of the wound, a skin ulceration is seen along the lateral margin of the remaining fifth metatarsal with locules of gas in soft tissues deep to the ulceration. There is a thin collection of fluid versus granulation tissue tracking deep to this wound laterally at the level of the fifth metatarsal.  Intense marrow edema and enhancement  are seen throughout all of the metatarsal remnants. Less intense marrow edema and enhancement are also seen in the periphery of the cuboid. A mild degree of edema and enhancement are noted in the calcaneus centered at the peroneal tubercle.  IMPRESSION: Status post amputation at the level the bases of metatarsals. Intense marrow edema and enhancement throughout all the metatarsals remnants is consistent with osteomyelitis. Milder degree of edema and enhancement in the lateral cuboid is also worrisome for osteomyelitis. Very mild edema and enhancement in the calcaneus could be secondary to reactive change or osteomyelitis.  Likely small soft tissue abscess lateral to the patient's large open wound.  Skin ulceration over the base of fifth metatarsal. Soft tissues deep to the ulceration and locules of gas.   Electronically Signed   By: Drusilla Kanner M.D.   On: 10/13/2014 08:23   Dg Chest Port 1 View  09/25/2014   CLINICAL DATA:  Fever.  EXAM: PORTABLE CHEST - 1 VIEW  COMPARISON:  09/01/2014  FINDINGS: The cardiomediastinal contours are normal. Linear atelectasis in the left greater than right lower lung zone. Pulmonary vasculature is normal. No consolidation, pleural effusion, or pneumothorax. A skin fold projects over the left hemithorax. No acute osseous abnormalities are seen.  IMPRESSION: Minimal subsegmental atelectasis at the lung bases. No consolidation to suggest pneumonia.   Electronically Signed   By: Rubye Oaks M.D.    On: 09/25/2014 06:30   Dg Foot 2 Views Right  09/16/2014   CLINICAL DATA:  Osteomyelitis.  EXAM: RIGHT FOOT - 2 VIEW  COMPARISON:  08/31/2014  FINDINGS: Interval resection of the first and second toe. There is now cortical irregularity of the distal first metatarsal consistent with osteomyelitis. Soft tissue air noted in the surgical bed about the second digit. No additional site concerning for osteomyelitis.  IMPRESSION: Post resection of the first and second toes. 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   Electronically Signed   By: Rubye Oaks M.D.   On: 09/16/2014 02:48   Dg Foot Complete Right  10/12/2014   CLINICAL DATA:  Right foot amputation 09/12/2014. Pain. Erythema. Using.  EXAM: RIGHT FOOT COMPLETE - 3+ VIEW  COMPARISON:  10/10/2014  FINDINGS: There is irregularity of the soft tissues along the amputation bed. There is some gas penetration along the soft tissues, similar to 10/10/2014. The foot is amputated at the proximal metatarsal metaphyseal level.  Soft tissue swelling along the stump and midfoot. No obvious osteolysis along the metatarsal bases.  IMPRESSION: 1. Speckled gas density centrally along the amputation bed extending back towards the metatarsal bases, question breakdown of the amputation bed flap. Localized soft tissue infection is not excluded. There is considerable soft tissue swelling in the midfoot and along the amputation bed. No bony destruction.   Electronically Signed   By: Gaylyn Rong M.D.   On: 10/12/2014 14:22   Dg Foot Complete Right  10/10/2014   CLINICAL DATA:  Recent metatarsal amputation. Fell. Pain and swelling.  EXAM: RIGHT FOOT COMPLETE - 3+ VIEW  COMPARISON:  10/01/2014  FINDINGS: Stable surgical changes from a proximal metatarsal midfoot amputation. There is air in the soft tissues which was not present on the prior film. This could be due to disruption of the wound. I do not see any definite findings for acute  fracture or osteomyelitis.  IMPRESSION: New areas noted in the soft tissues of the foot overlying the amputation. This could be due to disruption of the wound or wound dehiscence.  Stable appearance at the amputation site. No fracture or evidence of osteomyelitis.   Electronically Signed   By: Rudie Meyer M.D.   On: 10/10/2014 22:36   Dg Foot Complete Right  10/01/2014   CLINICAL DATA:  Right foot pain and swelling after recent amputation on 09/20/2014.  EXAM: RIGHT FOOT COMPLETE - 3+ VIEW  COMPARISON:  09/15/2014  FINDINGS: The patient is status post transmetatarsal amputation of all 5 toes across the proximal metatarsal level. Some irregularity of overlying soft tissues is identified without evidence of soft tissue gas or foreign body. Bony amputation margins appear unremarkable with no evidence of bony destruction. No fractures identified.  IMPRESSION: No acute findings by x-ray of the right foot status post recent transmetatarsal amputation.   Electronically Signed   By: Irish Lack M.D.   On: 10/01/2014 14:28    Elenore Paddy, PA-S  [facility] Pager:[phone]  If 7PM-7AM, please contact night-coverage [email or web address] Password [email or web address] 10/15/2014, 11:23 AM   LOS: 3 days   Attending MD note  Patient was seen, examined,treatment plan was discussed with /PA-S.  I have personally reviewed the clinical findings, lab, imaging studies and management of this patient in detail. I agree with the documentation, as recorded  Patient is without major complaints. CBGs have been relatively stable since starting insulin 70/30 yesterday evening. Orthopedics planning on trans-tibial amputation on 6/21. Have spoken with social work regarding placement to a SNF post amputation. Continue with current antibiotics.  Windell Norfolk MD   [facility]

## 2014-10-15 NOTE — Progress Notes (Signed)
ANTIBIOTIC CONSULT NOTE - follow up  Pharmacy Consult for Vancomycin and Zosyn Indication: OM/wound infection  No Known Allergies  Patient Measurements: Height:  (177.8 cm) Weight: 165 lb 8 oz (75.07 kg) IBW/kg (Calculated) : 73  Vital Signs: Temp: 98 F (36.7 C) (06/20 1349) Temp Source: Oral (06/20 1349) BP: 138/92 mmHg (06/20 1349) Pulse Rate: 60 (06/20 1349) Intake/Output from previous day: 06/19 0701 - 06/20 0700 In: 1380 [P.O.:1080; IV Piggyback:300] Out: 2125 [Urine:2125] Intake/Output from this shift: Total I/O In: 1575 [P.O.:1200; I.V.:375] Out: -   Labs:  Recent Labs  10/13/14 0631 10/15/14 0458  WBC 10.1 11.6*  HGB 9.4* 9.4*  PLT 303 338  CREATININE 1.06 1.33*   Estimated Creatinine Clearance: 63.3 mL/min (by C-G formula based on Cr of 1.33).  Recent Labs  10/15/14 1740  VANCOTROUGH 22*     Microbiology: Recent Results (from the past 720 hour(s))  Surgical pcr screen     Status: None   Collection Time: 09/20/14  4:43 AM  Result Value Ref Range Status   MRSA, PCR NEGATIVE NEGATIVE Final   Staphylococcus aureus NEGATIVE NEGATIVE Final    Comment:        The Xpert SA Assay (FDA approved for NASAL specimens in patients over 61 years of age), is one component of a comprehensive surveillance program.  Test performance has been validated by [facility] for patients greater than or equal to 64 year old. It is not intended to diagnose infection nor to guide or monitor treatment.   Culture, blood (routine x 2)     Status: None   Collection Time: 09/25/14  7:50 AM  Result Value Ref Range Status   Specimen Description BLOOD RIGHT ARM  Final   Special Requests BOTTLES DRAWN AEROBIC ONLY 5CC  Final   Culture   Final    NO GROWTH 5 DAYS Performed at [facility]    Report Status 10/01/2014 FINAL  Final  Culture, blood (routine x 2)     Status: None   Collection Time: 09/25/14  8:00 AM  Result Value Ref Range Status   Specimen  Description BLOOD RIGHT ARM  Final   Special Requests BOTTLES DRAWN AEROBIC ONLY 5CC  Final   Culture   Final    NO GROWTH 5 DAYS Performed at [facility]    Report Status 10/01/2014 FINAL  Final  Culture, blood (routine x 2)     Status: None (Preliminary result)   Collection Time: 10/12/14  2:04 PM  Result Value Ref Range Status   Specimen Description BLOOD LEFT ARM  Final   Special Requests Normal 10CC EACH BOTTLE  Final   Culture NO GROWTH 3 DAYS  Final   Report Status PENDING  Incomplete  Culture, blood (routine x 2)     Status: None (Preliminary result)   Collection Time: 10/12/14  2:08 PM  Result Value Ref Range Status   Specimen Description BLOOD RIGHT ARM  Final   Special Requests   Final    Normal BOTTLES DRAWN AEROBIC AND ANAEROBIC 9 CC EACH BOTTLE   Culture NO GROWTH 3 DAYS  Final   Report Status PENDING  Incomplete  Wound culture     Status: None (Preliminary result)   Collection Time: 10/12/14  3:10 PM  Result Value Ref Range Status   Specimen Description WOUND FOOT  Final   Special Requests NONE  Final   Gram Stain PENDING  Incomplete   Culture PENDING  Incomplete  Report Status PENDING  Incomplete   Medical History: Past Medical History  Diagnosis Date  . Diabetes mellitus without complication   . Depression    Assessment: 59yo male with worsening pain and swelling of right foot.  Pt is s/p partial amputation in May. SCr is slightly elevated. Pharmacy consult for vancomycin and zosyn on 6/17. Plan is for amputation tomorrow 6/21. Scr rising, Afebrile, WBC up slightly  6/17 >> vanc >> 6/17 >> Zosyn >>  6/20 1700 VT = 52mcg/ml on 1g q12 (prior to 6th dose)  Goal of Therapy:  vanc trough 15-20 mcg/mL  Plan:   Reduce vancomycin 750mg  q12h based on trough 5mcg/ml  IF Scr continues to increase then likely needs changed to q24h dosing  Continue current Zosyn dosing  After surgery tomorrow, will need assessment of whether antibiotics still  needed  Juliette Alcide, PharmD, BCPS.   Pager: [phone] 10/15/2014 6:21 PM

## 2014-10-16 ENCOUNTER — Inpatient Hospital Stay (HOSPITAL_COMMUNITY): Payer: Medicaid Other | Admitting: Anesthesiology

## 2014-10-16 ENCOUNTER — Encounter (HOSPITAL_COMMUNITY): Payer: Self-pay | Admitting: Anesthesiology

## 2014-10-16 ENCOUNTER — Encounter (HOSPITAL_COMMUNITY)
Admission: EM | Disposition: A | Discharge status: Skilled Nursing Facility | Payer: Self-pay | Source: Home / Self Care | Attending: Internal Medicine

## 2014-10-16 HISTORY — PX: AMPUTATION: SHX166

## 2014-10-16 LAB — GLUCOSE, CAPILLARY
GLUCOSE-CAPILLARY: 131 mg/dL — AB (ref 65–99)
GLUCOSE-CAPILLARY: 71 mg/dL (ref 65–99)
GLUCOSE-CAPILLARY: 49 mg/dL — AB (ref 65–99)
GLUCOSE-CAPILLARY: 90 mg/dL (ref 65–99)
GLUCOSE-CAPILLARY: 61 mg/dL — AB (ref 65–99)
GLUCOSE-CAPILLARY: 84 mg/dL (ref 65–99)
GLUCOSE-CAPILLARY: 78 mg/dL (ref 65–99)
Glucose-Capillary: 90 mg/dL (ref 65–99)

## 2014-10-16 LAB — SURGICAL PCR SCREEN
MRSA, PCR: NEGATIVE
Staphylococcus aureus: NEGATIVE

## 2014-10-16 SURGERY — AMPUTATION BELOW KNEE
Anesthesia: General | Site: Leg Lower | Laterality: Right

## 2014-10-16 MED ORDER — MORPHINE SULFATE 2 MG/ML IJ SOLN
2.0000 mg | INTRAMUSCULAR | Status: DC | PRN
  Administered 2014-10-16: 2 mg via INTRAVENOUS
  Filled 2014-10-16 (×2): qty 1

## 2014-10-16 MED ORDER — FENTANYL CITRATE (PF) 100 MCG/2ML IJ SOLN
INTRAMUSCULAR | Status: AC
  Filled 2014-10-16: qty 2

## 2014-10-16 MED ORDER — PROPOFOL 10 MG/ML IV BOLUS
INTRAVENOUS | Status: AC
  Filled 2014-10-16: qty 20

## 2014-10-16 MED ORDER — ACETAMINOPHEN 650 MG RE SUPP: 650.0000 mg | Freq: Four times a day (QID) | RECTAL | Status: DC | PRN

## 2014-10-16 MED ORDER — DEXTROSE 50 % IV SOLN
INTRAVENOUS | Status: AC
  Filled 2014-10-16: qty 50

## 2014-10-16 MED ORDER — MIDAZOLAM HCL 2 MG/2ML IJ SOLN
INTRAMUSCULAR | Status: AC
  Filled 2014-10-16: qty 2

## 2014-10-16 MED ORDER — KETOROLAC TROMETHAMINE 30 MG/ML IJ SOLN
INTRAMUSCULAR | Status: DC | PRN
  Administered 2014-10-16: 30 mg via INTRAVENOUS

## 2014-10-16 MED ORDER — ACETAMINOPHEN 325 MG PO TABS
650.0000 mg | ORAL_TABLET | Freq: Four times a day (QID) | ORAL | Status: DC | PRN
Start: 2014-10-16 — End: 2014-10-18
  Administered 2014-10-17 – 2014-10-18 (×3): 650 mg via ORAL
  Filled 2014-10-16 (×3): qty 2

## 2014-10-16 MED ORDER — PROMETHAZINE HCL 25 MG/ML IJ SOLN: 6.2500 mg | INTRAMUSCULAR | Status: DC | PRN

## 2014-10-16 MED ORDER — DEXTROSE 50 % IV SOLN
25.0000 mL | Freq: Once | INTRAVENOUS | Status: AC
  Administered 2014-10-16: 25 mL via INTRAVENOUS
  Filled 2014-10-16: qty 50

## 2014-10-16 MED ORDER — ONDANSETRON HCL 4 MG/2ML IJ SOLN
INTRAMUSCULAR | Status: AC
  Filled 2014-10-16: qty 2

## 2014-10-16 MED ORDER — OXYCODONE HCL 5 MG PO TABS
5.0000 mg | ORAL_TABLET | ORAL | Status: DC | PRN
  Administered 2014-10-17 – 2014-10-18 (×10): 10 mg via ORAL
  Filled 2014-10-16 (×10): qty 2

## 2014-10-16 MED ORDER — METOCLOPRAMIDE HCL 5 MG/ML IJ SOLN: 5.0000 mg | Freq: Three times a day (TID) | INTRAMUSCULAR | Status: DC | PRN

## 2014-10-16 MED ORDER — FENTANYL CITRATE (PF) 250 MCG/5ML IJ SOLN
INTRAMUSCULAR | Status: AC
  Filled 2014-10-16: qty 5

## 2014-10-16 MED ORDER — PROPOFOL 10 MG/ML IV BOLUS
INTRAVENOUS | Status: DC | PRN
  Administered 2014-10-16: 150 mg via INTRAVENOUS

## 2014-10-16 MED ORDER — DEXTROSE 5 % IV SOLN
500.0000 mg | Freq: Four times a day (QID) | INTRAVENOUS | Status: DC | PRN
  Filled 2014-10-16: qty 5

## 2014-10-16 MED ORDER — FENTANYL CITRATE (PF) 100 MCG/2ML IJ SOLN
25.0000 ug | INTRAMUSCULAR | Status: DC | PRN
  Administered 2014-10-16 (×3): 50 ug via INTRAVENOUS

## 2014-10-16 MED ORDER — METHOCARBAMOL 500 MG PO TABS
500.0000 mg | ORAL_TABLET | Freq: Four times a day (QID) | ORAL | Status: DC | PRN
  Administered 2014-10-17 – 2014-10-18 (×5): 500 mg via ORAL
  Filled 2014-10-16 (×5): qty 1

## 2014-10-16 MED ORDER — HYDROMORPHONE HCL 1 MG/ML IJ SOLN
0.5000 mg | INTRAMUSCULAR | Status: DC | PRN
  Administered 2014-10-16 (×2): 0.5 mg via INTRAVENOUS

## 2014-10-16 MED ORDER — GLYCOPYRROLATE 0.2 MG/ML IJ SOLN
INTRAMUSCULAR | Status: AC
  Filled 2014-10-16: qty 1

## 2014-10-16 MED ORDER — LACTATED RINGERS IV SOLN
INTRAVENOUS | Status: DC | PRN
  Administered 2014-10-16: 16:00:00 via INTRAVENOUS

## 2014-10-16 MED ORDER — ONDANSETRON HCL 4 MG PO TABS: 4.0000 mg | ORAL_TABLET | Freq: Four times a day (QID) | ORAL | Status: DC | PRN

## 2014-10-16 MED ORDER — HYDROMORPHONE HCL 1 MG/ML IJ SOLN
INTRAMUSCULAR | Status: AC
  Filled 2014-10-16: qty 1

## 2014-10-16 MED ORDER — LORAZEPAM 2 MG/ML IJ SOLN
0.5000 mg | Freq: Four times a day (QID) | INTRAMUSCULAR | Status: DC | PRN
  Administered 2014-10-16 – 2014-10-18 (×2): 0.5 mg via INTRAVENOUS
  Filled 2014-10-16 (×2): qty 1

## 2014-10-16 MED ORDER — EPHEDRINE SULFATE 50 MG/ML IJ SOLN
INTRAMUSCULAR | Status: DC | PRN
  Administered 2014-10-16: 5 mg via INTRAVENOUS

## 2014-10-16 MED ORDER — DEXTROSE 50 % IV SOLN
INTRAVENOUS | Status: AC
  Administered 2014-10-16: 25 mL via INTRAVENOUS
  Filled 2014-10-16: qty 50

## 2014-10-16 MED ORDER — FENTANYL CITRATE (PF) 100 MCG/2ML IJ SOLN
50.0000 ug | INTRAMUSCULAR | Status: AC | PRN
  Administered 2014-10-16 (×2): 50 ug via INTRAVENOUS

## 2014-10-16 MED ORDER — MORPHINE SULFATE 2 MG/ML IJ SOLN: 1.0000 mg | INTRAMUSCULAR | Status: DC | PRN

## 2014-10-16 MED ORDER — MIDAZOLAM HCL 5 MG/5ML IJ SOLN
INTRAMUSCULAR | Status: DC | PRN
  Administered 2014-10-16: 2 mg via INTRAVENOUS

## 2014-10-16 MED ORDER — LIDOCAINE HCL (CARDIAC) 20 MG/ML IV SOLN
INTRAVENOUS | Status: DC | PRN
  Administered 2014-10-16: 50 mg via INTRAVENOUS

## 2014-10-16 MED ORDER — ARTIFICIAL TEARS OP OINT
TOPICAL_OINTMENT | OPHTHALMIC | Status: AC
  Filled 2014-10-16: qty 3.5

## 2014-10-16 MED ORDER — METOCLOPRAMIDE HCL 10 MG PO TABS: 5.0000 mg | ORAL_TABLET | Freq: Three times a day (TID) | ORAL | Status: DC | PRN

## 2014-10-16 MED ORDER — ONDANSETRON HCL 4 MG/2ML IJ SOLN: 4.0000 mg | Freq: Four times a day (QID) | INTRAMUSCULAR | Status: DC | PRN

## 2014-10-16 MED ORDER — 0.9 % SODIUM CHLORIDE (POUR BTL) OPTIME
TOPICAL | Status: DC | PRN
  Administered 2014-10-16: 1000 mL

## 2014-10-16 MED ORDER — SODIUM CHLORIDE 0.9 % IV SOLN
INTRAVENOUS | Status: DC
Start: 2014-10-16 — End: 2014-10-18
  Administered 2014-10-17: 10 mL/h via INTRAVENOUS

## 2014-10-16 MED ORDER — FENTANYL CITRATE (PF) 100 MCG/2ML IJ SOLN
INTRAMUSCULAR | Status: DC | PRN
  Administered 2014-10-16: 50 ug via INTRAVENOUS
  Administered 2014-10-16: 25 ug via INTRAVENOUS
  Administered 2014-10-16 (×3): 50 ug via INTRAVENOUS
  Administered 2014-10-16: 25 ug via INTRAVENOUS

## 2014-10-16 MED ORDER — KETOROLAC TROMETHAMINE 30 MG/ML IJ SOLN
INTRAMUSCULAR | Status: AC
  Filled 2014-10-16: qty 1

## 2014-10-16 MED ORDER — HYDROMORPHONE HCL 1 MG/ML IJ SOLN
1.0000 mg | INTRAMUSCULAR | Status: DC | PRN
  Administered 2014-10-16 – 2014-10-17 (×3): 1 mg via INTRAVENOUS
  Filled 2014-10-16 (×4): qty 1

## 2014-10-16 MED ORDER — DEXTROSE 50 % IV SOLN
25.0000 mL | Freq: Once | INTRAVENOUS | Status: AC
  Administered 2014-10-16: 25 mL via INTRAVENOUS

## 2014-10-16 SURGICAL SUPPLY — 37 items
BAG ZIPLOCK 12X15 (MISCELLANEOUS) IMPLANT
BLADE HEX COATED 2.75 (ELECTRODE) ×2 IMPLANT
BLADE SAW RECIPROCATING 77.5 (BLADE) ×2 IMPLANT
BNDG COHESIVE 6X5 TAN STRL LF (GAUZE/BANDAGES/DRESSINGS) ×2 IMPLANT
BNDG GAUZE ELAST 4 BULKY (GAUZE/BANDAGES/DRESSINGS) ×2 IMPLANT
CUFF TOURN SGL QUICK 34 (TOURNIQUET CUFF) ×1
CUFF TRNQT CYL 34X4X40X1 (TOURNIQUET CUFF) ×1 IMPLANT
DRAIN PENROSE 18X1/2 LTX STRL (DRAIN) IMPLANT
DRAPE SHEET LG 3/4 BI-LAMINATE (DRAPES) ×2 IMPLANT
DRAPE U-SHAPE 47X51 STRL (DRAPES) ×2 IMPLANT
DRSG ADAPTIC 3X8 NADH LF (GAUZE/BANDAGES/DRESSINGS) ×2 IMPLANT
DRSG EMULSION OIL 3X16 NADH (GAUZE/BANDAGES/DRESSINGS) ×2 IMPLANT
DRSG PAD ABDOMINAL 8X10 ST (GAUZE/BANDAGES/DRESSINGS) ×4 IMPLANT
ELECT REM PT RETURN 9FT ADLT (ELECTROSURGICAL) ×2
ELECTRODE REM PT RTRN 9FT ADLT (ELECTROSURGICAL) ×1 IMPLANT
GAUZE SPONGE 4X4 12PLY STRL (GAUZE/BANDAGES/DRESSINGS) ×4 IMPLANT
GLOVE BIOGEL PI IND STRL 8.5 (GLOVE) ×1 IMPLANT
GLOVE BIOGEL PI INDICATOR 8.5 (GLOVE) ×1
GLOVE SURG ORTHO 9.0 STRL STRW (GLOVE) ×2 IMPLANT
KIT BASIN OR (CUSTOM PROCEDURE TRAY) ×2 IMPLANT
MANIFOLD NEPTUNE II (INSTRUMENTS) ×2 IMPLANT
NS IRRIG 1000ML POUR BTL (IV SOLUTION) ×2 IMPLANT
PACK ORTHO EXTREMITY (CUSTOM PROCEDURE TRAY) ×2 IMPLANT
PAD ABD 8X10 STRL (GAUZE/BANDAGES/DRESSINGS) ×4 IMPLANT
POSITIONER SURGICAL ARM (MISCELLANEOUS) ×2 IMPLANT
SPONGE LAP 18X18 X RAY DECT (DISPOSABLE) ×2 IMPLANT
STAPLER VISISTAT 35W (STAPLE) ×2 IMPLANT
STOCKINETTE 8 INCH (MISCELLANEOUS) ×2 IMPLANT
SUT PDS AB 1 CT1 27 (SUTURE) IMPLANT
SUT SILK 2 0 (SUTURE)
SUT SILK 2-0 18XBRD TIE 12 (SUTURE) IMPLANT
SUT VIC AB 1 CTX 36 (SUTURE) ×3
SUT VIC AB 1 CTX36XBRD ANBCTR (SUTURE) ×3 IMPLANT
SWAB COLLECTION DEVICE MRSA (MISCELLANEOUS) IMPLANT
TUBE ANAEROBIC SPECIMEN COL (MISCELLANEOUS) IMPLANT
WATER STERILE IRR 1500ML POUR (IV SOLUTION) ×2 IMPLANT
YANKAUER SUCT BULB TIP NO VENT (SUCTIONS) ×2 IMPLANT

## 2014-10-16 NOTE — Consult Note (Signed)
Psychiatry Consult Follow Up  Reason for Consult:  Needs medication for Major depressive disorder, recurrent and anxiety Referring Physician: Dr. Sharma Covert Patient Identification: Tyler Burgess MRN:  [id number] Principal Diagnosis: MDD-recurrent moderate, Generalized anxiety disorder Diagnosis:   Patient Active Problem List   Diagnosis Date Noted  . Major depressive disorder, recurrent episode, mild [F33.0]   . Wound infection [T79.8XXA] 10/12/2014  . Depression [F32.9]   . Diabetic ketoacidosis without coma associated with type 2 diabetes mellitus [E13.10]   . Weakness [R53.1]   . DKA (diabetic ketoacidoses) [E13.10] 09/15/2014  . Hyponatremia [E87.1] 09/15/2014  . DKA, type 2, not at goal [E13.10] 09/15/2014  . Suicidal ideations [R45.851]   . Diarrhea [R19.7]   . Enteritis due to Clostridium difficile [A04.7] 09/05/2014  . Osteomyelitis [M86.9] 08/31/2014  . Sepsis [A41.9] 08/31/2014  . Substance abuse [F19.10]   . Suicidal ideation [R45.851]   . Type 2 diabetes mellitus with hyperglycemia [E11.65] 04/15/2014  . Suicidal behavior [F48.9] 04/11/2014  . 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 GAD (generalized anxiety disorder) [F41.1] 04/11/2014  . Alcohol use disorder, severe, dependence [F10.20] 04/11/2014  . Opioid use disorder, moderate, in sustained remission [F11.90] 04/11/2014  . Opioid dependence in remission [F11.21] 01/10/2014  . Severe major depression without psychotic features [F32.2] 01/09/2014  . 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 Type 2 diabetes mellitus with peripheral neuropathy [E11.40] 12/11/2013  . 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 male patient admitted with post surgical foot infection .  HPI:  Patient is  59 year old male with history of diabetes, depression and anxiety. Thanks for asking me  to evaluate the patient for medication management. Patient reports that he has not been taking his mental health medications regularly for the past one month. However, he reports that his medications helped when he was compliant and would like to get back on them. He reports vague depressive symptoms, anxiety, excessive worries, difficulty sleeping, apprehension and poor concentration. He is currently homeless, in poor medical health and poor financial state. Patient reports history of alcohol abuse but not in the past few weeks. Currently, he denies suicidal ideations, psychosis or delusional thinking.  Interval History: Patient chart reviewed and psych social service notes. Patient has scheduled orthopedic procedure and patient has been thinking about after care plans. Patient is compliant with psychiatric medication without adverse effects. Patient has been emotionally prepared with the the plan of going for surgery - below knee amputation. Patient will be referred to the skilled nursing facility and rehabilitation when medically stable. Patient denied current suicidal/homicidal ideation, intention or plans.   Past Medical History:  Past Medical History  Diagnosis Date  . Diabetes mellitus without complication   . Depression     Past Surgical History  Procedure Laterality Date  . Right arm    . Cholecystectomy    . Amputation toe Right 09/03/2014    Procedure: AMPUTATION RIGHT GREAT TOE AND SECOND TOE;  Surgeon: Aviva Signs Md, MD;  Location: [facility];  Service: General;  Laterality: Right;  right great and second toes  . Amputation Right 09/20/2014    Procedure: RIGHT TRANSMETATARSAL AMPUTATION;  Surgeon: Newt Minion, MD;  Location: [facility];  Service: Orthopedics;  Laterality: Right;   Family History:  Family History  Problem Relation Age of Onset  . Diabetes Mother   . Depression  Father   . Depression Brother   . Diabetes Other   . Depression Other    Social History:  History  Alcohol  Use  . Yes    Comment: once per month     History  Drug Use No    History   Social History  . Marital Status: Widowed    Spouse Name: N/A  . Number of Children: N/A  . Years of Education: N/A   Social History Main Topics  . Smoking status: Current Every Day Smoker -- 0.50 packs/day for 35 years    Types: Cigarettes  . Smokeless tobacco: Never Used  . Alcohol Use: Yes     Comment: once per month  . Drug Use: No  . Sexual Activity: No   Other Topics Concern  . None   Social History Narrative   Homeless and supposed to go to a Shelter in [place].  [facility] could not get medications right and so he took a loaded pistol into the center and had to be jailed for 9 days.  Gets around in a truck.  Lives in his truck and occasionally visits his brother.     Additional Social History:                          Allergies:  No Known Allergies  Labs:  Results for orders placed or performed during the hospital encounter of 10/12/14 (from the past 48 hour(s))  Glucose, capillary     Status: Abnormal   Collection Time: 10/14/14  4:54 PM  Result Value Ref Range   Glucose-Capillary 274 (H) 65 - 99 mg/dL  Glucose, capillary     Status: Abnormal   Collection Time: 10/14/14  9:21 PM  Result Value Ref Range   Glucose-Capillary 190 (H) 65 - 99 mg/dL   Comment 1 Notify RN   CBC     Status: Abnormal   Collection Time: 10/15/14  4:58 AM  Result Value Ref Range   WBC 11.6 (H) 4.0 - 10.5 K/uL   RBC 2.99 (L) 4.22 - 5.81 MIL/uL   Hemoglobin 9.4 (L) 13.0 - 17.0 g/dL   HCT 29.3 (L) 39.0 - 52.0 %   MCV 98.0 78.0 - 100.0 fL   MCH 31.4 26.0 - 34.0 pg   MCHC 32.1 30.0 - 36.0 g/dL   RDW 14.4 11.5 - 15.5 %   Platelets 338 150 - 400 K/uL  Basic metabolic panel     Status: Abnormal   Collection Time: 10/15/14  4:58 AM  Result Value Ref Range   Sodium 139 135 - 145 mmol/L   Potassium 3.7 3.5 - 5.1 mmol/L   Chloride 106 101 - 111 mmol/L   CO2 26 22 - 32 mmol/L   Glucose,  Bld 62 (L) 65 - 99 mg/dL   BUN 19 6 - 20 mg/dL   Creatinine, Ser 1.33 (H) 0.61 - 1.24 mg/dL   Calcium 8.7 (L) 8.9 - 10.3 mg/dL   GFR calc non Af Amer 58 (L) >60 mL/min   GFR calc Af Amer >60 >60 mL/min    Comment: (NOTE) The eGFR has been calculated using the CKD EPI equation. This calculation has not been validated in all clinical situations. eGFR's persistently <60 mL/min signify possible Chronic Kidney Disease.    Anion gap 7 5 - 15  Glucose, capillary     Status: None   Collection Time: 10/15/14  7:20 AM  Result Value Ref Range  Glucose-Capillary 87 65 - 99 mg/dL  Glucose, capillary     Status: Abnormal   Collection Time: 10/15/14 10:42 AM  Result Value Ref Range   Glucose-Capillary 247 (H) 65 - 99 mg/dL  Glucose, capillary     Status: Abnormal   Collection Time: 10/15/14  5:06 PM  Result Value Ref Range   Glucose-Capillary 223 (H) 65 - 99 mg/dL  Vancomycin, trough     Status: Abnormal   Collection Time: 10/15/14  5:40 PM  Result Value Ref Range   Vancomycin Tr 22 (H) 10.0 - 20.0 ug/mL  Glucose, capillary     Status: None   Collection Time: 10/15/14  9:41 PM  Result Value Ref Range   Glucose-Capillary 93 65 - 99 mg/dL  Glucose, capillary     Status: Abnormal   Collection Time: 10/16/14  7:37 AM  Result Value Ref Range   Glucose-Capillary 131 (H) 65 - 99 mg/dL  Surgical pcr screen     Status: None   Collection Time: 10/16/14  7:42 AM  Result Value Ref Range   MRSA, PCR NEGATIVE NEGATIVE   Staphylococcus aureus NEGATIVE NEGATIVE    Comment:        The Xpert SA Assay (FDA approved for NASAL specimens in patients over 46 years of age), is one component of a comprehensive surveillance program.  Test performance has been validated by [facility] for patients greater than or equal to 19 year old. It is not intended to diagnose infection nor to guide or monitor treatment.   Glucose, capillary     Status: None   Collection Time: 10/16/14 12:20 PM  Result Value Ref  Range   Glucose-Capillary 71 65 - 99 mg/dL    Vitals: Blood pressure 151/81, pulse 64, temperature 97.5 F (36.4 C), temperature source Oral, resp. rate 16, height 5' 10"  (1.778 m), weight 75.5 kg (166 lb 7.2 oz), SpO2 100 %.  Risk to Self: Is patient at risk for suicide?: No Risk to Others:   Prior Inpatient Therapy:   Prior Outpatient Therapy:    Current Facility-Administered Medications  Medication Dose Route Frequency Provider Last Rate Last Dose  . 0.9 %  sodium chloride infusion   Intravenous Continuous Jonetta Osgood, MD 75 mL/hr at 10/16/14 1405    . ARIPiprazole (ABILIFY) tablet 5 mg  5 mg Oral Daily Mojeed Akintayo   5 mg at 10/15/14 0900  . dextrose 50 % solution 25 mL  25 mL Intravenous Once [facility], Immunologist      . fentaNYL (SUBLIMAZE) injection 50-100 mcg  50-100 mcg Intravenous Q5 min PRN Myrtie Soman, MD   50 mcg at 10/16/14 1628  . folic acid (FOLVITE) tablet 1 mg  1 mg Oral Daily Phillips Grout, MD   1 mg at 10/15/14 0900  . gabapentin (NEURONTIN) capsule 300 mg  300 mg Oral TID Jonetta Osgood, MD   300 mg at 10/15/14 2225  . heparin injection 5,000 Units  5,000 Units Subcutaneous 3 times per day Erline Hau, MD   5,000 Units at 10/16/14 0528  . HYDROcodone-acetaminophen (NORCO/VICODIN) 5-325 MG per tablet 1-2 tablet  1-2 tablet Oral Q4H PRN Phillips Grout, MD   2 tablet at 10/16/14 [phone]  . insulin aspart (novoLOG) injection 0-15 Units  0-15 Units Subcutaneous TID WC Jonetta Osgood, MD   2 Units at 10/16/14 0749  . insulin aspart protamine- aspart (NOVOLOG MIX 70/30) injection 20 Units  20 Units Subcutaneous BID WC Shanker  Kristeen Mans, MD   20 Units at 10/16/14 0749  . LORazepam (ATIVAN) injection 0.5 mg  0.5 mg Intravenous Q6H PRN Jonetta Osgood, MD   0.5 mg at 10/16/14 1021  . morphine 2 MG/ML injection 2 mg  2 mg Intravenous Q4H PRN Jonetta Osgood, MD   2 mg at 10/16/14 1142  . multivitamin with minerals tablet 1 tablet  1 tablet Oral Daily  Phillips Grout, MD   1 tablet at 10/15/14 0900  . ondansetron (ZOFRAN) tablet 4 mg  4 mg Oral Q6H PRN Phillips Grout, MD       Or  . ondansetron (ZOFRAN) injection 4 mg  4 mg Intravenous Q6H PRN Phillips Grout, MD      . piperacillin-tazobactam (ZOSYN) IVPB 3.375 g  3.375 g Intravenous Q8H Orpah Greek, MD   3.375 g at 10/16/14 1405  . sertraline (ZOLOFT) tablet 50 mg  50 mg Oral Daily Mojeed Akintayo   50 mg at 10/15/14 0900  . thiamine (VITAMIN B-1) tablet 100 mg  100 mg Oral Daily Phillips Grout, MD   100 mg at 10/15/14 0900   Or  . thiamine (B-1) injection 100 mg  100 mg Intravenous Daily Phillips Grout, MD   100 mg at 10/16/14 0911  . traZODone (DESYREL) tablet 50 mg  50 mg Oral QHS PRN Mojeed Akintayo   50 mg at 10/15/14 0034  . vancomycin (VANCOCIN) IVPB 750 mg/150 ml premix  750 mg Intravenous Q12H Berton Mount, RPH   750 mg at 10/16/14 1287   Facility-Administered Medications Ordered in Other Encounters  Medication Dose Route Frequency Provider Last Rate Last Dose  . lactated ringers infusion    Continuous PRN Kristopher Key, CRNA        Musculoskeletal: Strength & Muscle Tone: within normal limits Gait & Station: normal Patient leans: N/A  Psychiatric Specialty Exam: Physical Exam  Review of Systems  Constitutional: Negative.   HENT: Negative.   Eyes: Negative.   Respiratory: Negative.   Cardiovascular: Negative.   Gastrointestinal: Negative.   Genitourinary: Negative.   Musculoskeletal:       S/p left foot amputation two weeks ago.  Skin: Negative.   Neurological: Negative.   Endo/Heme/Allergies: Negative.     Blood pressure 151/81, pulse 64, temperature 97.5 F (36.4 C), temperature source Oral, resp. rate 16, height 5' 10"  (1.778 m), weight 75.5 kg (166 lb 7.2 oz), SpO2 100 %.Body mass index is 23.88 kg/(m^2).  General Appearance: Casual  Eye Contact::  Good  Speech:  Clear and Coherent and Normal Rate  Volume:  Normal  Mood:  Anxious, Depressed  and Hopeless  Affect:  Appropriate and Congruent  Thought Process:  Coherent, Goal Directed and Intact  Orientation:  Full (Time, Place, and Person)  Thought Content:  WDL  Suicidal Thoughts:  No  Homicidal Thoughts:  No  Memory:  Immediate;   Good Recent;   Good Remote;   Good  Judgement:  Fair  Insight:  Fair and Shallow  Psychomotor Activity:  Normal  Concentration:  Fair  Recall:  NA  Fund of Knowledge:Fair  Language: Good  Akathisia:  NA  Handed:  Right  AIMS (if indicated):     Assets:  Desire for Improvement  ADL's:  Intact  Cognition: WNL  Sleep:   poor   Medical Decision Making: Established Problem, Stable/Improving (1), Review or order clinical lab tests (1), Review or order medicine tests (1), Review of Medication Regimen &  Side Effects (2) and Review of New Medication or Change in Dosage (2)  Treatment Plan Summary: Daily contact with patient to assess and evaluate symptoms and progress in treatment. Medication management: Provide supportive treatment and care Continue Abilify 5 mg po daily for depressed mood, Zoloft 50 mg po daily for depression,  Continue Cogentin 0.5 mg po daily for EPS and Trazodone 25m Qhs prn insomnia. Appreciate psychiatric consultation and follow up as clinically required Please contact [phone] or [phone] if needs further assistance  Disposition: Please refer patient to outpatient psychiatric service upon medically discharged or referred to SNF for rehab treatment.  Tarick Parenteau,JANARDHAHA R. 10/16/2014 4:31 PM

## 2014-10-16 NOTE — Anesthesia Preprocedure Evaluation (Signed)
Anesthesia Evaluation  Patient identified by MRN, date of birth, ID band Patient awake    Reviewed: Allergy & Precautions, NPO status , Patient's Chart, lab work & pertinent test results  Airway Mallampati: II  TM Distance: >3 FB Neck ROM: Full    Dental no notable dental hx.    Pulmonary Current Smoker,  breath sounds clear to auscultation  Pulmonary exam normal       Cardiovascular + Peripheral Vascular Disease Normal cardiovascular examRhythm:Regular Rate:Normal     Neuro/Psych Bipolar Disorder negative neurological ROS  negative psych ROS   GI/Hepatic negative GI ROS, (+)     substance abuse  alcohol use,   Endo/Other  diabetes  Renal/GU negative Renal ROS  negative genitourinary   Musculoskeletal negative musculoskeletal ROS (+)   Abdominal   Peds negative pediatric ROS (+)  Hematology negative hematology ROS (+)   Anesthesia Other Findings   Reproductive/Obstetrics negative OB ROS                             Anesthesia Physical Anesthesia Plan  ASA: III  Anesthesia Plan: General   Post-op Pain Management:    Induction: Intravenous  Airway Management Planned: LMA  Additional Equipment:   Intra-op Plan:   Post-operative Plan: Extubation in OR  Informed Consent: I have reviewed the patients History and Physical, chart, labs and discussed the procedure including the risks, benefits and alternatives for the proposed anesthesia with the patient or authorized representative who has indicated his/her understanding and acceptance.   Dental advisory given  Plan Discussed with: CRNA and Surgeon  Anesthesia Plan Comments:         Anesthesia Quick Evaluation

## 2014-10-16 NOTE — Progress Notes (Signed)
Status post transtibial amputation. Muscle was healthy and viable at the amputation site no signs of infection. Recommended continue IV antibiotics for 48 hours postoperatively. Orders are written for patient to be discharged to skilled nursing facility. I will follow-up in the office in 3 weeks.

## 2014-10-16 NOTE — Progress Notes (Signed)
Clinical Social Work  CSW met with patient in order to evaluate and provide support. Patient reports he is anxious and has not been resting well. Patient reports he feels depression is escalating today because he is worried about upcoming surgery. CSW inquired about any SI or HI or any psychotic features. Patient denies any SI or HI but feels that depression will decrease once he has successfully completed surgery. Patient reports he has been homeless for about 1 year and is anxious about DC back to truck at [facility] spoke with director [facility] Rife) who is agreeable to offer letter of guarantee (LOG) if patient does not have any family support. Patient reports he has a brother and children in [place] but is unable to stay with any of them. Patient reports he is in the process of completing application for disability. Patient is hopeful that if surgery goes well and he can go to SNF after DC from the hospital that he can manage his depression better. Patient reports that psychosocial stressors are his main concerns. CSW explained LOG process and that SNF could be researched but several counties would have to be searched. Patient understanding and agreeable.  Patient engaged throughout assessment and reports he appreciates CSW visit. Patient appeared depressed and reports some tearfulness due to recent medical needs. Patient contracts for safety and agreeable to outpatient follow up for mental health needs when medically stable.  CSW completed FL2 and faxed out to several surrounding counties. CSW submitted additional information to [facility] due to [facility] expiring on 10/20/14. CSW will continue to follow to provide support and to assist with SNF placement at DC.  [place], [place] [phone]

## 2014-10-16 NOTE — Transfer of Care (Signed)
Immediate Anesthesia Transfer of Care Note  Patient: Tyler Burgess  Procedure(s) Performed: Procedure(s): AMPUTATION BELOW KNEE (Right)  Patient Location: PACU  Anesthesia Type:General  Level of Consciousness:  sedated, patient cooperative and responds to stimulation  Airway & Oxygen Therapy:Patient Spontanous Breathing and Patient connected to face mask oxgen  Post-op Assessment:  Report given to PACU RN and Post -op Vital signs reviewed and stable  Post vital signs:  Reviewed and stable  Last Vitals:  Filed Vitals:   10/16/14 1955  BP: 185/91  Pulse: 66  Temp: 36.4 C  Resp: 13    Complications: No apparent anesthesia complications

## 2014-10-16 NOTE — Progress Notes (Signed)
PATIENT DETAILS Name: Tyler Burgess Age: 59 y.o. Sex: male Date of Birth: [birth year] Admit Date: 10/12/2014 Admitting Physician Haydee Monica, MD PCP:No PCP Per Patient  Subjective: Pain in the right foot area.   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 Blood cultures have no growth to date. MRSA and staph aureus are negative. Wound culture pending. Transtibial amputation scheduled today, 10/16/14. Continue with prn narcotics  Active Problems: Type 2 diabetes mellitus with peripheral neuropathy: Uncontrolled, last A1c 13.0 on 09/16/14. Continue Insulin 70/30 20 units BID. Will start oral hypoglycemic agents when close at discharge. Continue Neurontin for peripheral neuropathy.   Anemia: Likely secondary to chronic disease. His hemoglobin remained 9.4 the past two days. Hb stable, Follow.  Depression: Denies any suicidal or homicidal ideation. Claims unable to afford any of his psychiatric medications-therefore noncompliant. Seen by Psych-prior regimen of  Abilify  po daily and Zoloft  po daily has been resumed.   Alcohol abuse: Currently awake and alert.Ativan prn  Homelessness:social worker eval  Disposition: Remain inpatient-SNF on discharge  Antimicrobial agents  See below  Anti-infectives    Start     Dose/Rate Route Frequency Ordered Stop   10/16/14 0600  vancomycin (VANCOCIN) IVPB 750 mg/150 ml premix     750 mg 150 mL/hr over 60 Minutes Intravenous Every 12 hours 10/15/14 1828     10/13/14 0400  vancomycin (VANCOCIN) IVPB 1000 mg/200 mL premix  Status:  Discontinued     1,000 mg 200 mL/hr over 60 Minutes Intravenous Every 12 hours 10/12/14 1817 10/15/14 1828   10/12/14 2200  piperacillin-tazobactam (ZOSYN) IVPB 3.375 g     3.375 g 12.5 mL/hr over 240 Minutes Intravenous Every 8 hours 10/12/14 1817     10/12/14 1430  vancomycin (VANCOCIN) 1,500 mg in sodium chloride 0.9 % 500 mL IVPB     1,500 mg 250 mL/hr over 120 Minutes Intravenous  Once 10/12/14 1406 10/12/14 1705   10/12/14 1415  piperacillin-tazobactam (ZOSYN) IVPB 3.375 g     3.375 g 100 mL/hr over 30 Minutes Intravenous STAT 10/12/14 1406 10/12/14 1505      DVT Prophylaxis: Prophylactic Heparin  Code Status: Full code   Family Communication None at bedside  Procedures: None  CONSULTS:  orthopedic surgery  Time spent 35 minutes-Greater than 50% of this time was spent in counseling, explanation of diagnosis, planning of further management, and coordination of care.  MEDICATIONS: Scheduled Meds: . ARIPiprazole  5 mg Oral Daily  . folic acid  1 mg Oral Daily  . gabapentin  300 mg Oral TID  . heparin subcutaneous  5,000 Units Subcutaneous 3 times per day  . insulin aspart  0-15 Units Subcutaneous TID WC  . insulin aspart protamine- aspart  20 Units Subcutaneous BID WC  . multivitamin with minerals  1 tablet Oral Daily  . piperacillin-tazobactam (ZOSYN)  IV  3.375 g Intravenous Q8H  . sertraline  50 mg Oral Daily  . thiamine  100 mg Oral Daily   Or  . thiamine  100 mg Intravenous Daily  . vancomycin  750 mg Intravenous Q12H   Continuous Infusions: . sodium chloride 75 mL/hr (10/15/14 2231)   PRN Meds:.HYDROcodone-acetaminophen, morphine injection, ondansetron **OR** ondansetron (ZOFRAN) IV, traZODone    PHYSICAL EXAM: Vital signs in last 24 hours: Filed Vitals:   10/15/14 1349 10/15/14 2142  10/16/14 0521 10/16/14 0714  BP: 138/92 149/81 137/73   Pulse: 60 60 54   Temp: 98 F (36.7 C) 98.5 F (36.9 C) 98.1 F (36.7 C)   TempSrc: Oral Oral Oral   Resp: 18 16 16    Height:      Weight:    75.5 kg (166 lb 7.2 oz)  SpO2: 100% 100% 100%     Weight change:  Filed Weights   10/14/14 0500 10/15/14 0626 10/16/14 0714  Weight: 69.854 kg (154 lb) 75.07 kg (165 lb 8 oz) 75.5 kg (166 lb 7.2 oz)   Body mass  index is 23.88 kg/(m^2).   Gen Exam: Awake and alert with clear speech. Neck: Supple, No JVD.   Chest: B/L Clear.   CVS: S1 S2 Regular, no murmurs.  Abdomen: soft, BS +, non tender, non distended.  Extremities: no edema, lower extremities warm to touch. right lower extremity bandaged-did not open Neurologic: Non Focal.   Skin: No Rash.   Wounds: R foot wound  Intake/Output from previous day:  Intake/Output Summary (Last 24 hours) at 10/16/14 0940 Last data filed at 10/16/14 0636  Gross per 24 hour  Intake   2820 ml  Output   1800 ml  Net   1020 ml     LAB RESULTS: CBC  Recent Labs Lab 10/12/14 1359 10/13/14 0631 10/15/14 0458  WBC 12.6* 10.1 11.6*  HGB 10.1* 9.4* 9.4*  HCT 30.4* 28.8* 29.3*  PLT 360 303 338  MCV 95.9 96.0 98.0  MCH 31.9 31.3 31.4  MCHC 33.2 32.6 32.1  RDW 13.9 14.2 14.4  LYMPHSABS 1.6  --   --   MONOABS 0.8  --   --   EOSABS 0.1  --   --   BASOSABS 0.1  --   --     Chemistries   Recent Labs Lab 10/12/14 1359 10/13/14 0631 10/15/14 0458  NA 131* 137 139  K 4.8 4.1 3.7  CL 96* 105 106  CO2 25 26 26   GLUCOSE 526* 162* 62*  BUN 14 16 19   CREATININE 1.27* 1.06 1.33*  CALCIUM 8.5* 8.4* 8.7*    CBG:  Recent Labs Lab 10/15/14 0720 10/15/14 1042 10/15/14 1706 10/15/14 2141 10/16/14 0737  GLUCAP 87 247* 223* 93 131*    GFR Estimated Creatinine Clearance: 63.3 mL/min (by C-G formula based on Cr of 1.33).   MICROBIOLOGY: Recent Results (from the past 240 hour(s))  Culture, blood (routine x 2)     Status: None (Preliminary result)   Collection Time: 10/12/14  2:04 PM  Result Value Ref Range Status   Specimen Description BLOOD LEFT ARM  Final   Special Requests Normal 10CC EACH BOTTLE  Final   Culture NO GROWTH 3 DAYS  Final   Report Status PENDING  Incomplete  Culture, blood (routine x 2)     Status: None (Preliminary result)   Collection Time: 10/12/14  2:08 PM  Result Value Ref Range Status   Specimen Description BLOOD  RIGHT ARM  Final   Special Requests   Final    Normal BOTTLES DRAWN AEROBIC AND ANAEROBIC 9 CC EACH BOTTLE   Culture NO GROWTH 3 DAYS  Final   Report Status PENDING  Incomplete  Wound culture     Status: None (Preliminary result)   Collection Time: 10/12/14  3:10 PM  Result Value Ref Range Status   Specimen Description WOUND FOOT  Final   Special Requests NONE  Final   Gram Stain PENDING  Incomplete   Culture PENDING  Incomplete   Report Status PENDING  Incomplete  Surgical pcr screen     Status: None   Collection Time: 10/16/14  7:42 AM  Result Value Ref Range Status   MRSA, PCR NEGATIVE NEGATIVE Final   Staphylococcus aureus NEGATIVE NEGATIVE Final    Comment:        The Xpert SA Assay (FDA approved for NASAL specimens in patients over 66 years of age), is one component of a comprehensive surveillance program.  Test performance has been validated by [facility] for patients greater than or equal to 23 year old. It is not intended to diagnose infection nor to guide or monitor treatment.     RADIOLOGY STUDIES/RESULTS: Ct Head Wo Contrast  09/22/2014   CLINICAL DATA:  Code stroke. Right arm numbness and weakness, acute onset. Initial encounter.  EXAM: CT HEAD WITHOUT CONTRAST  TECHNIQUE: Contiguous axial images were obtained from the base of the skull through the vertex without intravenous contrast.  COMPARISON:  None.  FINDINGS: There is no evidence of acute infarction, mass lesion, or intra- or extra-axial hemorrhage on CT.  Prominence of the ventricles and sulci reflects mild cortical volume loss. Mild periventricular white matter change likely reflects small vessel ischemic microangiopathy.  The brainstem and fourth ventricle are within normal limits. The basal ganglia are unremarkable in appearance. The cerebral hemispheres demonstrate grossly normal gray-white differentiation. No mass effect or midline shift is seen.  There is no evidence of fracture; visualized osseous  structures are unremarkable in appearance. The orbits are within normal limits. The paranasal sinuses and mastoid air cells are well-aerated. No significant soft tissue abnormalities are seen.  IMPRESSION: 1. No acute intracranial pathology seen on CT. 2. Mild cortical volume loss and scattered small vessel ischemic microangiopathy.  These results were called by telephone at the time of interpretation on 09/22/2014 at 5:37 pm to Dr. Susa Raring, who verbally acknowledged these results.   Electronically Signed   By: Roanna Raider M.D.   On: 09/22/2014 17:37   Mr Brain Wo Contrast  09/23/2014   CLINICAL DATA:  RIGHT-sided weakness and slurred speech beginning Sep 22, 2014. Status post RIGHT foot partial amputation Sep 20, 2014. History of diabetes, severe alcohol use, osteomyelitis.  EXAM: MRI HEAD WITHOUT CONTRAST  TECHNIQUE: Multiplanar, multiecho pulse sequences of the brain and surrounding structures were obtained without intravenous contrast.  COMPARISON:  CT head Sep 22, 2014  FINDINGS: Mild proportional prominence of the ventricles and sulci for age. No abnormal parenchymal signal, mass lesions, mass effect. No reduced diffusion to suggest acute ischemia. No susceptibility artifact to suggest hemorrhage.  No abnormal extra-axial fluid collections. No extra-axial masses though, contrast enhanced sequences would be more sensitive. Normal major intracranial vascular flow voids seen at the skull base.  Ocular globes and orbital contents are unremarkable though not tailored for evaluation. No abnormal sellar expansion. Mild paranasal sinus mucosal thickening. The mastoid air cells are well aerated. No suspicious calvarial bone marrow signal. No abnormal sellar expansion. Craniocervical junction maintained.  IMPRESSION: No acute intracranial process; specifically no acute ischemia.  Mild global parenchymal brain volume loss for age.   Electronically Signed   By: Awilda Metro M.D.   On: 09/23/2014 02:09    Mr Foot Right W Wo Contrast  10/13/2014   CLINICAL DATA:  Diabetic patient status post amputation at the bases of the metatarsals of the right foot 09/20/2014. Status post fall several days ago with a right foot injury  and wound dehiscence. Redness, pain and swelling.  EXAM: MRI OF THE RIGHT FOOT WITHOUT AND WITH CONTRAST  TECHNIQUE: Multiplanar, multisequence MR imaging was performed both before and after administration of intravenous contrast.  CONTRAST:  14 mL MULTIHANCE GADOBENATE DIMEGLUMINE 529 MG/ML IV SOLN  COMPARISON:  Plain films of the right foot 10/01/2014, 10/10/2014 and 10/12/2014.  FINDINGS: Postoperative change of amputation at the level the bases of all metatarsals is identified. There is a large open wound at the surgical site eccentric medially. The wound is centered over the remaining first, second and third metatarsals. In the soft tissues deep to the lateral margin of the wound, a skin ulceration is seen along the lateral margin of the remaining fifth metatarsal with locules of gas in soft tissues deep to the ulceration. There is a thin collection of fluid versus granulation tissue tracking deep to this wound laterally at the level of the fifth metatarsal.  Intense marrow edema and enhancement are seen throughout all of the metatarsal remnants. Less intense marrow edema and enhancement are also seen in the periphery of the cuboid. A mild degree of edema and enhancement are noted in the calcaneus centered at the peroneal tubercle.  IMPRESSION: Status post amputation at the level the bases of metatarsals. Intense marrow edema and enhancement throughout all the metatarsals remnants is consistent with osteomyelitis. Milder degree of edema and enhancement in the lateral cuboid is also worrisome for osteomyelitis. Very mild edema and enhancement in the calcaneus could be secondary to reactive change or osteomyelitis.  Likely small soft tissue abscess lateral to the patient's large open wound.   Skin ulceration over the base of fifth metatarsal. Soft tissues deep to the ulceration and locules of gas.   Electronically Signed   By: Drusilla Kanner M.D.   On: 10/13/2014 08:23   Dg Chest Port 1 View  09/25/2014   CLINICAL DATA:  Fever.  EXAM: PORTABLE CHEST - 1 VIEW  COMPARISON:  09/01/2014  FINDINGS: The cardiomediastinal contours are normal. Linear atelectasis in the left greater than right lower lung zone. Pulmonary vasculature is normal. No consolidation, pleural effusion, or pneumothorax. A skin fold projects over the left hemithorax. No acute osseous abnormalities are seen.  IMPRESSION: Minimal subsegmental atelectasis at the lung bases. No consolidation to suggest pneumonia.   Electronically Signed   By: Rubye Oaks M.D.   On: 09/25/2014 06:30   Dg Foot Complete Right  10/12/2014   CLINICAL DATA:  Right foot amputation 09/12/2014. Pain. Erythema. Using.  EXAM: RIGHT FOOT COMPLETE - 3+ VIEW  COMPARISON:  10/10/2014  FINDINGS: There is irregularity of the soft tissues along the amputation bed. There is some gas penetration along the soft tissues, similar to 10/10/2014. The foot is amputated at the proximal metatarsal metaphyseal level.  Soft tissue swelling along the stump and midfoot. No obvious osteolysis along the metatarsal bases.  IMPRESSION: 1. Speckled gas density centrally along the amputation bed extending back towards the metatarsal bases, question breakdown of the amputation bed flap. Localized soft tissue infection is not excluded. There is considerable soft tissue swelling in the midfoot and along the amputation bed. No bony destruction.   Electronically Signed   By: Gaylyn Rong M.D.   On: 10/12/2014 14:22   Dg Foot Complete Right  10/10/2014   CLINICAL DATA:  Recent metatarsal amputation. Fell. Pain and swelling.  EXAM: RIGHT FOOT COMPLETE - 3+ VIEW  COMPARISON:  10/01/2014  FINDINGS: Stable surgical changes from a proximal metatarsal midfoot amputation. There  is air in  the soft tissues which was not present on the prior film. This could be due to disruption of the wound. I do not see any definite findings for acute fracture or osteomyelitis.  IMPRESSION: New areas noted in the soft tissues of the foot overlying the amputation. This could be due to disruption of the wound or wound dehiscence.  Stable appearance at the amputation site. No fracture or evidence of osteomyelitis.   Electronically Signed   By: Rudie Meyer M.D.   On: 10/10/2014 22:36   Dg Foot Complete Right  10/01/2014   CLINICAL DATA:  Right foot pain and swelling after recent amputation on 09/20/2014.  EXAM: RIGHT FOOT COMPLETE - 3+ VIEW  COMPARISON:  09/15/2014  FINDINGS: The patient is status post transmetatarsal amputation of all 5 toes across the proximal metatarsal level. Some irregularity of overlying soft tissues is identified without evidence of soft tissue gas or foreign body. Bony amputation margins appear unremarkable with no evidence of bony destruction. No fractures identified.  IMPRESSION: No acute findings by x-ray of the right foot status post recent transmetatarsal amputation.   Electronically Signed   By: Irish Lack M.D.   On: 10/01/2014 14:28    Elenore Paddy, PA-S  [facility] Pager:[phone]  If 7PM-7AM, please contact night-coverage [email or web address] Password [email or web address] 10/16/2014, 9:40 AM   LOS: 4 days   Attending MD note  Patient was seen, examined,treatment plan was discussed with /PA-S.  I have personally reviewed the clinical findings, lab, imaging studies and management of this patient in detail. I agree with the documentation, as recorded  Patient is without major complaints-except for pain at the left foot. CBGs have been relatively stable since starting insulin 70/30. Orthopedics planning on trans-tibial amputation on 6/21. Have spoken with social work regarding placement to a SNF post amputation. Continue with current antibiotics.  Windell Norfolk MD   [facility]

## 2014-10-16 NOTE — Anesthesia Postprocedure Evaluation (Signed)
  Anesthesia Post-op Note  Patient: Tyler Burgess  Procedure(s) Performed: Procedure(s) (LRB): AMPUTATION BELOW KNEE (Right)  Patient Location: PACU  Anesthesia Type: General  Level of Consciousness: awake and alert   Airway and Oxygen Therapy: Patient Spontanous Breathing  Post-op Pain: mild  Post-op Assessment: Post-op Vital signs reviewed, Patient's Cardiovascular Status Stable, Respiratory Function Stable, Patent Airway and No signs of Nausea or vomiting  Last Vitals:  Filed Vitals:   10/16/14 2039  BP: 165/82  Pulse: 54  Temp:   Resp: 11    Post-op Vital Signs: stable   Complications: No apparent anesthesia complications

## 2014-10-16 NOTE — Clinical Social Work Placement (Signed)
   CLINICAL SOCIAL WORK PLACEMENT  NOTE  Date:  10/16/2014  Patient Details  Name: Tyler Burgess MRN: [id number] Date of Birth: October 07, 1955  Clinical Social Work is seeking post-discharge placement for this patient at the Skilled  Nursing Facility level of care (*CSW will initial, date and re-position this form in  chart as items are completed):  Yes   Patient/family provided with [facility] Clinical Social Work Department's list of facilities offering this level of care within the geographic area requested by the patient (or if unable, by the patient's family).  Yes   Patient/family informed of their freedom to choose among providers that offer the needed level of care, that participate in Medicare, Medicaid or managed care program needed by the patient, have an available bed and are willing to accept the patient.  Yes   Patient/family informed of [facility]'s ownership interest in [facility] and [facility], as well as of the fact that they are under no obligation to receive care at these facilities.  PASRR submitted to EDS on 10/16/14     PASRR number received on       Existing PASRR number confirmed on       FL2 transmitted to all facilities in geographic area requested by pt/family on 10/16/14     FL2 transmitted to all facilities within larger geographic area on       Patient informed that his/her managed care company has contracts with or will negotiate with certain facilities, including the following:            Patient/family informed of bed offers received.  Patient chooses bed at       Physician recommends and patient chooses bed at      Patient to be transferred to   on  .  Patient to be transferred to facility by       Patient family notified on   of transfer.  Name of family member notified:        PHYSICIAN       Additional Comment:    _______________________________________________ Marnee Spring, LCSW 10/16/2014, 4:16 PM

## 2014-10-16 NOTE — Op Note (Signed)
   Date of Surgery: 10/16/2014  INDICATIONS: Mr. Tyler Burgess is a 59 y.o.-year-old male who  Is a 59 year old gentleman, homeless, who underwent midfoot amputation. Patient [facility] with maggots in his wound. Patient was transferred to [facility] for evaluation and treatment.Marland Kitchen  PREOPERATIVE DIAGNOSIS: Dehiscence necrotic right midfoot amputation  POSTOPERATIVE DIAGNOSIS: Same.  PROCEDURE: Transtibial amputation right  SURGEON: Lajoyce Corners, M.D.  ANESTHESIA:  general  IV FLUIDS AND URINE: See anesthesia.  ESTIMATED BLOOD LOSS: Minimal mL.  COMPLICATIONS: None.  DESCRIPTION OF PROCEDURE: The patient was brought to the operating room and underwent a general anesthetic. After adequate levels of anesthesia were obtained patient's lower extremity was prepped using DuraPrep draped into a sterile field. A timeout was called.  A transverse incision was made 11 cm distal to the tibial tubercle. This curved proximally and a large posterior flap was created. The tibia was transected 1 cm proximal to the skin incision. The fibula was transected just proximal to the tibial incision. The tibia was beveled anteriorly. A large posterior flap was created. The sciatic nerve was pulled cut and allowed to retract. The vascular bundles were suture ligated with 2-0 silk. The deep and superficial fascial layers were closed using #1 Vicryl. The skin was closed using staples and 2-0 nylon. The wound was covered with Adaptic orthopedic sponges AB dressing Kerlix and Coban. Patient was extubated taken to the PACU in stable condition.  Tyler Baker, MD [facility] 7:47 PM

## 2014-10-16 NOTE — H&P (View-Only) (Signed)
Reason for Consult: Dehiscence transmetatarsal amputation right foot Referring Physician: Dr. Lorin Mercy and Dr. Madelyn Flavors is an 59 y.o. male.  HPI: Patient is a 59 year old gentleman with a very difficult social and medical conditions. Patient is essentially homeless. The few family members that are still alive state they will not care for him. Patient has psychiatric issues unable to afford his psychiatric medications unable to afford his diabetic medications. Hemoglobin A1c greater than 13. Patient initially presented to [facility] with maggots in the wound. Patient states the hospital suctioned out the maggots he was not taken to surgery and was referred down to [facility] for evaluation and treatment.  Past Medical History  Diagnosis Date  . Diabetes mellitus without complication   . Depression     Past Surgical History  Procedure Laterality Date  . Right arm    . Cholecystectomy    . Amputation toe Right 09/03/2014    Procedure: AMPUTATION RIGHT GREAT TOE AND SECOND TOE;  Surgeon: Aviva Signs Md, MD;  Location: [facility];  Service: General;  Laterality: Right;  right great and second toes  . Amputation Right 09/20/2014    Procedure: RIGHT TRANSMETATARSAL AMPUTATION;  Surgeon: Newt Minion, MD;  Location: [facility];  Service: Orthopedics;  Laterality: Right;    Family History  Problem Relation Age of Onset  . Diabetes Mother   . Depression Father   . Depression Brother   . Diabetes Other   . Depression Other     Social History:  reports that he has been smoking Cigarettes.  He has a 17.5 pack-year smoking history. He has never used smokeless tobacco. He reports that he drinks alcohol. He reports that he does not use illicit drugs.  Allergies: No Known Allergies  Medications: I have reviewed the patient's current medications.  Results for orders placed or performed during the hospital encounter of 10/12/14 (from the past 48 hour(s))  Urinalysis, Routine w reflex  microscopic (not at [facility])     Status: Abnormal   Collection Time: 10/12/14  5:25 PM  Result Value Ref Range   Color, Urine YELLOW YELLOW   APPearance CLEAR CLEAR   Specific Gravity, Urine 1.020 1.005 - 1.030   pH 6.0 5.0 - 8.0   Glucose, UA >1000 (A) NEGATIVE mg/dL   Hgb urine dipstick TRACE (A) NEGATIVE   Bilirubin Urine NEGATIVE NEGATIVE   Ketones, ur NEGATIVE NEGATIVE mg/dL   Protein, ur TRACE (A) NEGATIVE mg/dL   Urobilinogen, UA 0.2 0.0 - 1.0 mg/dL   Nitrite NEGATIVE NEGATIVE   Leukocytes, UA NEGATIVE NEGATIVE  Urine rapid drug screen (hosp performed)     Status: Abnormal   Collection Time: 10/12/14  5:25 PM  Result Value Ref Range   Opiates POSITIVE (A) NONE DETECTED   Cocaine NONE DETECTED NONE DETECTED   Benzodiazepines NONE DETECTED NONE DETECTED   Amphetamines NONE DETECTED NONE DETECTED   Tetrahydrocannabinol NONE DETECTED NONE DETECTED   Barbiturates NONE DETECTED NONE DETECTED    Comment:        DRUG SCREEN FOR MEDICAL PURPOSES ONLY.  IF CONFIRMATION IS NEEDED FOR ANY PURPOSE, NOTIFY LAB WITHIN 5 DAYS.        LOWEST DETECTABLE LIMITS FOR URINE DRUG SCREEN Drug Class       Cutoff (ng/mL) Amphetamine      1000 Barbiturate      200 Benzodiazepine   672 Tricyclics       094 Opiates  300 Cocaine          300 THC              50   Urine microscopic-add on     Status: None   Collection Time: 10/12/14  5:25 PM  Result Value Ref Range   Squamous Epithelial / LPF RARE RARE   WBC, UA 0-2 <3 WBC/hpf   RBC / HPF 3-6 <3 RBC/hpf   Bacteria, UA RARE RARE  Glucose, capillary     Status: Abnormal   Collection Time: 10/12/14 10:15 PM  Result Value Ref Range   Glucose-Capillary 242 (H) 65 - 99 mg/dL  Basic metabolic panel     Status: Abnormal   Collection Time: 10/13/14  6:31 AM  Result Value Ref Range   Sodium 137 135 - 145 mmol/L   Potassium 4.1 3.5 - 5.1 mmol/L   Chloride 105 101 - 111 mmol/L   CO2 26 22 - 32 mmol/L   Glucose, Bld 162 (H) 65 - 99 mg/dL    BUN 16 6 - 20 mg/dL   Creatinine, Ser 1.06 0.61 - 1.24 mg/dL   Calcium 8.4 (L) 8.9 - 10.3 mg/dL   GFR calc non Af Amer >60 >60 mL/min   GFR calc Af Amer >60 >60 mL/min    Comment: (NOTE) The eGFR has been calculated using the CKD EPI equation. This calculation has not been validated in all clinical situations. eGFR's persistently <60 mL/min signify possible Chronic Kidney Disease.    Anion gap 6 5 - 15  CBC     Status: Abnormal   Collection Time: 10/13/14  6:31 AM  Result Value Ref Range   WBC 10.1 4.0 - 10.5 K/uL   RBC 3.00 (L) 4.22 - 5.81 MIL/uL   Hemoglobin 9.4 (L) 13.0 - 17.0 g/dL   HCT 28.8 (L) 39.0 - 52.0 %   MCV 96.0 78.0 - 100.0 fL   MCH 31.3 26.0 - 34.0 pg   MCHC 32.6 30.0 - 36.0 g/dL   RDW 14.2 11.5 - 15.5 %   Platelets 303 150 - 400 K/uL  Glucose, capillary     Status: Abnormal   Collection Time: 10/13/14  8:16 AM  Result Value Ref Range   Glucose-Capillary 170 (H) 65 - 99 mg/dL   Comment 1 Notify RN   Glucose, capillary     Status: Abnormal   Collection Time: 10/13/14 11:35 AM  Result Value Ref Range   Glucose-Capillary 123 (H) 65 - 99 mg/dL  Glucose, capillary     Status: Abnormal   Collection Time: 10/13/14  4:47 PM  Result Value Ref Range   Glucose-Capillary 305 (H) 65 - 99 mg/dL  Glucose, capillary     Status: Abnormal   Collection Time: 10/13/14  9:20 PM  Result Value Ref Range   Glucose-Capillary 178 (H) 65 - 99 mg/dL  Glucose, capillary     Status: Abnormal   Collection Time: 10/14/14  7:49 AM  Result Value Ref Range   Glucose-Capillary 266 (H) 65 - 99 mg/dL  Glucose, capillary     Status: Abnormal   Collection Time: 10/14/14 10:19 AM  Result Value Ref Range   Glucose-Capillary 303 (H) 65 - 99 mg/dL    Mr Foot Right W Wo Contrast  10/13/2014   CLINICAL DATA:  Diabetic patient status post amputation at the bases of the metatarsals of the right foot 09/20/2014. Status post fall several days ago with a right foot injury and wound dehiscence.  Redness, pain   and swelling.  EXAM: MRI OF THE RIGHT FOOT WITHOUT AND WITH CONTRAST  TECHNIQUE: Multiplanar, multisequence MR imaging was performed both before and after administration of intravenous contrast.  CONTRAST:  14 mL MULTIHANCE GADOBENATE DIMEGLUMINE 529 MG/ML IV SOLN  COMPARISON:  Plain films of the right foot 10/01/2014, 10/10/2014 and 10/12/2014.  FINDINGS: Postoperative change of amputation at the level the bases of all metatarsals is identified. There is a large open wound at the surgical site eccentric medially. The wound is centered over the remaining first, second and third metatarsals. In the soft tissues deep to the lateral margin of the wound, a skin ulceration is seen along the lateral margin of the remaining fifth metatarsal with locules of gas in soft tissues deep to the ulceration. There is a thin collection of fluid versus granulation tissue tracking deep to this wound laterally at the level of the fifth metatarsal.  Intense marrow edema and enhancement are seen throughout all of the metatarsal remnants. Less intense marrow edema and enhancement are also seen in the periphery of the cuboid. A mild degree of edema and enhancement are noted in the calcaneus centered at the peroneal tubercle.  IMPRESSION: Status post amputation at the level the bases of metatarsals. Intense marrow edema and enhancement throughout all the metatarsals remnants is consistent with osteomyelitis. Milder degree of edema and enhancement in the lateral cuboid is also worrisome for osteomyelitis. Very mild edema and enhancement in the calcaneus could be secondary to reactive change or osteomyelitis.  Likely small soft tissue abscess lateral to the patient's large open wound.  Skin ulceration over the base of fifth metatarsal. Soft tissues deep to the ulceration and locules of gas.   Electronically Signed   By: Inge Rise M.D.   On: 10/13/2014 08:23    Review of Systems  All other systems reviewed and are  negative.  Blood pressure 133/76, pulse 50, temperature 98.2 F (36.8 C), temperature source Oral, resp. rate 18, height 5' 10" (1.778 m), weight 69.854 kg (154 lb), SpO2 100 %. Physical Exam On examination patient has blisters dorsally over the right ankle. Patient has complete dehiscence of the wound with the wound approximate 5 x 7 cm and this is open all the way down to the metatarsals. There is necrotic tissue at the base of the wound. Assessment/Plan: Assessment uncontrolled diabetic with uncontrolled psychiatric issues with dehiscence and gangrenous changes of the right transmetatarsal amputation.  Plan: Discussed with the patient that we will need to proceed with a transtibial amputation. Patient states he understands I will try to see if this can be scheduled on Monday or Tuesday. Patient will need discharge to skilled nursing.  Oliveah Zwack V 10/14/2014, 3:31 PM

## 2014-10-16 NOTE — Interval H&P Note (Signed)
History and Physical Interval Note:  10/16/2014 7:15 AM  Tyler Burgess  has presented today for surgery, with the diagnosis of Osteomyelitis, Gangrene Right Foot  The various methods of treatment have been discussed with the patient and family. After consideration of risks, benefits and other options for treatment, the patient has consented to  Procedure(s): AMPUTATION BELOW KNEE (Right) as a surgical intervention .  The patient's history has been reviewed, patient examined, no change in status, stable for surgery.  I have reviewed the patient's chart and labs.  Questions were answered to the patient's satisfaction.     [name]

## 2014-10-16 NOTE — Progress Notes (Signed)
cbg 61.  D50 1/2 amp given per Dr Okey Dupre.

## 2014-10-17 ENCOUNTER — Encounter (HOSPITAL_COMMUNITY): Payer: Self-pay | Admitting: Orthopedic Surgery

## 2014-10-17 DIAGNOSIS — F1020 Alcohol dependence, uncomplicated: Secondary | ICD-10-CM

## 2014-10-17 DIAGNOSIS — E1140 Type 2 diabetes mellitus with diabetic neuropathy, unspecified: Secondary | ICD-10-CM

## 2014-10-17 DIAGNOSIS — T798XXA Other early complications of trauma, initial encounter: Secondary | ICD-10-CM

## 2014-10-17 LAB — GLUCOSE, CAPILLARY
GLUCOSE-CAPILLARY: 57 mg/dL — AB (ref 65–99)
GLUCOSE-CAPILLARY: 312 mg/dL — AB (ref 65–99)
Glucose-Capillary: 155 mg/dL — ABNORMAL HIGH (ref 65–99)
Glucose-Capillary: 112 mg/dL — ABNORMAL HIGH (ref 65–99)
Glucose-Capillary: 83 mg/dL (ref 65–99)

## 2014-10-17 LAB — BASIC METABOLIC PANEL
ANION GAP: 7 (ref 5–15)
BUN: 13 mg/dL (ref 6–20)
CO2: 26 mmol/L (ref 22–32)
CREATININE: 1.22 mg/dL (ref 0.61–1.24)
Calcium: 8 mg/dL — ABNORMAL LOW (ref 8.9–10.3)
Chloride: 101 mmol/L (ref 101–111)
GFR calc non Af Amer: >60 mL/min (ref >60)
Glucose, Bld: 380 mg/dL — ABNORMAL HIGH (ref 65–99)
Potassium: 4.8 mmol/L (ref 3.5–5.1)
SODIUM: 134 mmol/L — AB (ref 135–145)

## 2014-10-17 LAB — CBC
HEMATOCRIT: 26.5 % — AB (ref 39.0–52.0)
HEMOGLOBIN: 8.3 g/dL — AB (ref 13.0–17.0)
MCH: 30.3 pg (ref 26.0–34.0)
MCHC: 31.3 g/dL (ref 30.0–36.0)
MCV: 96.7 fL (ref 78.0–100.0)
PLATELETS: 243 10*3/uL (ref 150–400)
RBC: 2.74 MIL/uL — AB (ref 4.22–5.81)
RDW: 14.2 % (ref 11.5–15.5)
WBC: 9.9 10*3/uL (ref 4.0–10.5)

## 2014-10-17 LAB — WOUND CULTURE: GRAM STAIN: NONE SEEN

## 2014-10-17 MED ORDER — HYDROMORPHONE HCL 1 MG/ML IJ SOLN
1.0000 mg | Freq: Once | INTRAMUSCULAR | Status: AC
  Administered 2014-10-17: 1 mg via INTRAVENOUS
  Filled 2014-10-17: qty 1

## 2014-10-17 MED ORDER — HYDROMORPHONE HCL 1 MG/ML IJ SOLN
1.0000 mg | INTRAMUSCULAR | Status: DC | PRN
  Administered 2014-10-17 – 2014-10-18 (×5): 1 mg via INTRAVENOUS
  Filled 2014-10-17 (×5): qty 1

## 2014-10-17 NOTE — Progress Notes (Signed)
Patient ID: Tyler Burgess, male   DOB: 06-10-1955, 59 y.o.   MRN: [id number] Postoperative day 1 right transtibial amputation. White blood cell count has decreased to normal range. Hemoglobin is dropped to 8.3 with a drop of 1 g. Patient has spoken to social work regarding discharge to skilled nursing. Patient does complain of pain and he is on Neurontin.

## 2014-10-17 NOTE — Progress Notes (Signed)
Inpatient Diabetes Program Recommendations  AACE/ADA: New Consensus Statement on Inpatient Glycemic Control (2013)  Target Ranges:  Prepandial:   less than 140 mg/dL      Peak postprandial:   less than 180 mg/dL (1-2 hours)      Critically ill patients:  140 - 180 mg/dL   Reason for Visit: Hyperglycemia  Results for ZACKARIAH, RUPARD (MRN [id number]) as of 10/17/2014 15:27  Ref. Range 10/16/2014 20:01 10/16/2014 20:19 10/16/2014 21:23 10/17/2014 07:43 10/17/2014 11:21  Glucose-Capillary Latest Ref Range: 65-99 mg/dL 61 (L) 84 78 681 (H) 275 (H)   Hyperglycemia this am with not receiving 70/30 pm dose, as pt was NPO in surgery. 70/30 resumed this am. CBG much improved at lunch.  Will continue to follow. Thank you. Ailene Ards, RD, LDN, CDE Inpatient Diabetes Coordinator [phone]

## 2014-10-17 NOTE — Progress Notes (Signed)
PATIENT DETAILS Name: Tyler Burgess Age: 59 y.o. Sex: male Date of Birth: 07-26-1955 Admit Date: 10/12/2014 Admitting Physician Haydee Monica, MD PCP:No PCP Per Patient  Subjective: Pain in the right foot area.   Assessment/Plan: Principal Problem: Right foot wound dehiscence at site of metatarsal amputation with infection: Continue empiric vancomycin and Zosyn for another 48 hours as per orthopedic recommendation, appreciate orthopedic evaluation. Blood cultures have no growth to date. 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 Continue with prn narcotics  Active Problems: Type 2 diabetes mellitus with peripheral neuropathy: Uncontrolled, last A1c 13.0 on 09/16/14. Continue Insulin 70/30 20 units BID. Continue Neurontin for peripheral neuropathy.   Anemia: Likely secondary to chronic disease. hemoglobin is 8.3 today , it dropped from 9.4 , which is expected after surgery .  Depression: Denies any suicidal or homicidal ideation. Claims unable to afford any of his psychiatric medications-therefore noncompliant. Seen by Psych-prior regimen of  Abilify  po daily and Zoloft  po daily has been resumed.   Alcohol abuse: Currently awake and alert.Ativan prn  Homelessness:social worker eval  Disposition: Remain inpatient-SNF on discharge in 1-2 days.  Antimicrobial agents  See below  Anti-infectives    Start     Dose/Rate Route Frequency Ordered Stop   10/16/14 0600  vancomycin (VANCOCIN) IVPB 750 mg/150 ml premix     750 mg 150 mL/hr over 60 Minutes Intravenous Every 12 hours 10/15/14 1828     10/13/14 0400  vancomycin (VANCOCIN) IVPB 1000 mg/200 mL premix  Status:  Discontinued     1,000 mg 200 mL/hr over 60 Minutes Intravenous Every 12 hours 10/12/14 1817 10/15/14 1828   10/12/14 2200  piperacillin-tazobactam (ZOSYN) IVPB 3.375 g     3.375 g 12.5 mL/hr over 240 Minutes Intravenous Every 8 hours 10/12/14 1817     10/12/14 1430   vancomycin (VANCOCIN) 1,500 mg in sodium chloride 0.9 % 500 mL IVPB     1,500 mg 250 mL/hr over 120 Minutes Intravenous  Once 10/12/14 1406 10/12/14 1705   10/12/14 1415  piperacillin-tazobactam (ZOSYN) IVPB 3.375 g     3.375 g 100 mL/hr over 30 Minutes Intravenous STAT 10/12/14 1406 10/12/14 1505      DVT Prophylaxis: Prophylactic Heparin  Code Status: Full code   Family Communication None at bedside  Procedures: Is post right transtibial amputation 10/16/14  CONSULTS:  orthopedic surgery  Time spent 35 minutes-Greater than 50% of this time was spent in counseling, explanation of diagnosis, planning of further management, and coordination of care.  MEDICATIONS: Scheduled Meds: . ARIPiprazole  5 mg Oral Daily  . folic acid  1 mg Oral Daily  . gabapentin  300 mg Oral TID  . heparin subcutaneous  5,000 Units Subcutaneous 3 times per day  . insulin aspart  0-15 Units Subcutaneous TID WC  . insulin aspart protamine- aspart  20 Units Subcutaneous BID WC  . multivitamin with minerals  1 tablet Oral Daily  . piperacillin-tazobactam (ZOSYN)  IV  3.375 g Intravenous Q8H  . sertraline  50 mg Oral Daily  . thiamine  100 mg Oral Daily   Or  . thiamine  100 mg Intravenous Daily  . vancomycin  750 mg Intravenous Q12H   Continuous Infusions: . sodium chloride 75 mL/hr (10/17/14 0041)  . sodium chloride 10 mL/hr (10/17/14 0040)   PRN Meds:.acetaminophen **OR** acetaminophen, HYDROmorphone (DILAUDID) injection, LORazepam, methocarbamol **OR** methocarbamol (  ROBAXIN)  IV, metoCLOPramide **OR** metoCLOPramide (REGLAN) injection, ondansetron **OR** ondansetron (ZOFRAN) IV, ondansetron **OR** ondansetron (ZOFRAN) IV, oxyCODONE, traZODone    PHYSICAL EXAM: Vital signs in last 24 hours: Filed Vitals:   10/17/14 0043 10/17/14 0200 10/17/14 0600 10/17/14 0947  BP: 161/78 156/85 141/85 161/88  Pulse: 61 62 80 69  Temp: 98 F (36.7 C) 97.6 F (36.4 C) 98.2 F (36.8 C) 99 F (37.2 C)   TempSrc: Oral Oral Oral Oral  Resp: 16 16 18 18   Height:      Weight:      SpO2: 100% 100% 100% 100%    Weight change:  Filed Weights   10/14/14 0500 10/15/14 0626 10/16/14 0714  Weight: 69.854 kg (154 lb) 75.07 kg (165 lb 8 oz) 75.5 kg (166 lb 7.2 oz)   Body mass index is 23.88 kg/(m^2).   Gen Exam: Awake and alert with clear speech. Neck: Supple, No JVD.   Chest: B/L Clear.   CVS: S1 S2 Regular, no murmurs.  Abdomen: soft, BS +, non tender, non distended.  Extremities: no edema, lower extremities warm to touch. right lower extremity bandaged at the site of transtibial amputation . Neurologic: Non Focal.   Skin: No Rash.   Wounds: R foot wound  Intake/Output from previous day:  Intake/Output Summary (Last 24 hours) at 10/17/14 1203 Last data filed at 10/17/14 0947  Gross per 24 hour  Intake   2015 ml  Output    750 ml  Net   1265 ml     LAB RESULTS: CBC  Recent Labs Lab 10/12/14 1359 10/13/14 0631 10/15/14 0458 10/17/14 0853  WBC 12.6* 10.1 11.6* 9.9  HGB 10.1* 9.4* 9.4* 8.3*  HCT 30.4* 28.8* 29.3* 26.5*  PLT 360 303 338 243  MCV 95.9 96.0 98.0 96.7  MCH 31.9 31.3 31.4 30.3  MCHC 33.2 32.6 32.1 31.3  RDW 13.9 14.2 14.4 14.2  LYMPHSABS 1.6  --   --   --   MONOABS 0.8  --   --   --   EOSABS 0.1  --   --   --   BASOSABS 0.1  --   --   --     Chemistries   Recent Labs Lab 10/12/14 1359 10/13/14 0631 10/15/14 0458 10/17/14 0853  NA 131* 137 139 134*  K 4.8 4.1 3.7 4.8  CL 96* 105 106 101  CO2 25 26 26 26   GLUCOSE 526* 162* 62* 380*  BUN 14 16 19 13   CREATININE 1.27* 1.06 1.33* 1.22  CALCIUM 8.5* 8.4* 8.7* 8.0*    CBG:  Recent Labs Lab 10/16/14 2001 10/16/14 2019 10/16/14 2123 10/17/14 0743 10/17/14 1121  GLUCAP 61* 84 78 312* 155*    GFR Estimated Creatinine Clearance: 69 mL/min (by C-G formula based on Cr of 1.22).   MICROBIOLOGY: Recent Results (from the past 240 hour(s))  Culture, blood (routine x 2)     Status: None  (Preliminary result)   Collection Time: 10/12/14  2:04 PM  Result Value Ref Range Status   Specimen Description BLOOD LEFT ARM  Final   Special Requests Normal 10CC EACH BOTTLE  Final   Culture NO GROWTH 4 DAYS  Final   Report Status PENDING  Incomplete  Culture, blood (routine x 2)     Status: None (Preliminary result)   Collection Time: 10/12/14  2:08 PM  Result Value Ref Range Status   Specimen Description BLOOD RIGHT ARM  Final   Special Requests   Final  Normal BOTTLES DRAWN AEROBIC AND ANAEROBIC 9 CC EACH BOTTLE   Culture NO GROWTH 4 DAYS  Final   Report Status PENDING  Incomplete  Wound culture     Status: None (Preliminary result)   Collection Time: 10/12/14  3:10 PM  Result Value Ref Range Status   Specimen Description WOUND FOOT  Final   Special Requests NONE  Final   Gram Stain PENDING  Incomplete   Culture PENDING  Incomplete   Report Status PENDING  Incomplete  Surgical pcr screen     Status: None   Collection Time: 10/16/14  7:42 AM  Result Value Ref Range Status   MRSA, PCR NEGATIVE NEGATIVE Final   Staphylococcus aureus NEGATIVE NEGATIVE Final    Comment:        The Xpert SA Assay (FDA approved for NASAL specimens in patients over 59 years of age), is one component of a comprehensive surveillance program.  Test performance has been validated by [facility] for patients greater than or equal to 40 year old. It is not intended to diagnose infection nor to guide or monitor treatment.     RADIOLOGY STUDIES/RESULTS: Ct Head Wo Contrast  09/22/2014   CLINICAL DATA:  Code stroke. Right arm numbness and weakness, acute onset. Initial encounter.  EXAM: CT HEAD WITHOUT CONTRAST  TECHNIQUE: Contiguous axial images were obtained from the base of the skull through the vertex without intravenous contrast.  COMPARISON:  None.  FINDINGS: There is no evidence of acute infarction, mass lesion, or intra- or extra-axial hemorrhage on CT.  Prominence of the ventricles and  sulci reflects mild cortical volume loss. Mild periventricular white matter change likely reflects small vessel ischemic microangiopathy.  The brainstem and fourth ventricle are within normal limits. The basal ganglia are unremarkable in appearance. The cerebral hemispheres demonstrate grossly normal gray-white differentiation. No mass effect or midline shift is seen.  There is no evidence of fracture; visualized osseous structures are unremarkable in appearance. The orbits are within normal limits. The paranasal sinuses and mastoid air cells are well-aerated. No significant soft tissue abnormalities are seen.  IMPRESSION: 1. No acute intracranial pathology seen on CT. 2. Mild cortical volume loss and scattered small vessel ischemic microangiopathy.  These results were called by telephone at the time of interpretation on 09/22/2014 at 5:37 pm to Dr. Susa Raring, who verbally acknowledged these results.   Electronically Signed   By: Roanna Raider M.D.   On: 09/22/2014 17:37   Mr Brain Wo Contrast  09/23/2014   CLINICAL DATA:  RIGHT-sided weakness and slurred speech beginning Sep 22, 2014. Status post RIGHT foot partial amputation Sep 20, 2014. History of diabetes, severe alcohol use, osteomyelitis.  EXAM: MRI HEAD WITHOUT CONTRAST  TECHNIQUE: Multiplanar, multiecho pulse sequences of the brain and surrounding structures were obtained without intravenous contrast.  COMPARISON:  CT head Sep 22, 2014  FINDINGS: Mild proportional prominence of the ventricles and sulci for age. No abnormal parenchymal signal, mass lesions, mass effect. No reduced diffusion to suggest acute ischemia. No susceptibility artifact to suggest hemorrhage.  No abnormal extra-axial fluid collections. No extra-axial masses though, contrast enhanced sequences would be more sensitive. Normal major intracranial vascular flow voids seen at the skull base.  Ocular globes and orbital contents are unremarkable though not tailored for evaluation. No  abnormal sellar expansion. Mild paranasal sinus mucosal thickening. The mastoid air cells are well aerated. No suspicious calvarial bone marrow signal. No abnormal sellar expansion. Craniocervical junction maintained.  IMPRESSION: No acute intracranial  process; specifically no acute ischemia.  Mild global parenchymal brain volume loss for age.   Electronically Signed   By: Awilda Metro M.D.   On: 09/23/2014 02:09   Mr Foot Right W Wo Contrast  10/13/2014   CLINICAL DATA:  Diabetic patient status post amputation at the bases of the metatarsals of the right foot 09/20/2014. Status post fall several days ago with a right foot injury and wound dehiscence. Redness, pain and swelling.  EXAM: MRI OF THE RIGHT FOOT WITHOUT AND WITH CONTRAST  TECHNIQUE: Multiplanar, multisequence MR imaging was performed both before and after administration of intravenous contrast.  CONTRAST:  14 mL MULTIHANCE GADOBENATE DIMEGLUMINE 529 MG/ML IV SOLN  COMPARISON:  Plain films of the right foot 10/01/2014, 10/10/2014 and 10/12/2014.  FINDINGS: Postoperative change of amputation at the level the bases of all metatarsals is identified. There is a large open wound at the surgical site eccentric medially. The wound is centered over the remaining first, second and third metatarsals. In the soft tissues deep to the lateral margin of the wound, a skin ulceration is seen along the lateral margin of the remaining fifth metatarsal with locules of gas in soft tissues deep to the ulceration. There is a thin collection of fluid versus granulation tissue tracking deep to this wound laterally at the level of the fifth metatarsal.  Intense marrow edema and enhancement are seen throughout all of the metatarsal remnants. Less intense marrow edema and enhancement are also seen in the periphery of the cuboid. A mild degree of edema and enhancement are noted in the calcaneus centered at the peroneal tubercle.  IMPRESSION: Status post amputation at the  level the bases of metatarsals. Intense marrow edema and enhancement throughout all the metatarsals remnants is consistent with osteomyelitis. Milder degree of edema and enhancement in the lateral cuboid is also worrisome for osteomyelitis. Very mild edema and enhancement in the calcaneus could be secondary to reactive change or osteomyelitis.  Likely small soft tissue abscess lateral to the patient's large open wound.  Skin ulceration over the base of fifth metatarsal. Soft tissues deep to the ulceration and locules of gas.   Electronically Signed   By: Drusilla Kanner M.D.   On: 10/13/2014 08:23   Dg Chest Port 1 View  09/25/2014   CLINICAL DATA:  Fever.  EXAM: PORTABLE CHEST - 1 VIEW  COMPARISON:  09/01/2014  FINDINGS: The cardiomediastinal contours are normal. Linear atelectasis in the left greater than right lower lung zone. Pulmonary vasculature is normal. No consolidation, pleural effusion, or pneumothorax. A skin fold projects over the left hemithorax. No acute osseous abnormalities are seen.  IMPRESSION: Minimal subsegmental atelectasis at the lung bases. No consolidation to suggest pneumonia.   Electronically Signed   By: Rubye Oaks M.D.   On: 09/25/2014 06:30   Dg Foot Complete Right  10/12/2014   CLINICAL DATA:  Right foot amputation 09/12/2014. Pain. Erythema. Using.  EXAM: RIGHT FOOT COMPLETE - 3+ VIEW  COMPARISON:  10/10/2014  FINDINGS: There is irregularity of the soft tissues along the amputation bed. There is some gas penetration along the soft tissues, similar to 10/10/2014. The foot is amputated at the proximal metatarsal metaphyseal level.  Soft tissue swelling along the stump and midfoot. No obvious osteolysis along the metatarsal bases.  IMPRESSION: 1. Speckled gas density centrally along the amputation bed extending back towards the metatarsal bases, question breakdown of the amputation bed flap. Localized soft tissue infection is not excluded. There is considerable soft tissue  swelling in the midfoot and along the amputation bed. No bony destruction.   Electronically Signed   By: Gaylyn Rong M.D.   On: 10/12/2014 14:22   Dg Foot Complete Right  10/10/2014   CLINICAL DATA:  Recent metatarsal amputation. Fell. Pain and swelling.  EXAM: RIGHT FOOT COMPLETE - 3+ VIEW  COMPARISON:  10/01/2014  FINDINGS: Stable surgical changes from a proximal metatarsal midfoot amputation. There is air in the soft tissues which was not present on the prior film. This could be due to disruption of the wound. I do not see any definite findings for acute fracture or osteomyelitis.  IMPRESSION: New areas noted in the soft tissues of the foot overlying the amputation. This could be due to disruption of the wound or wound dehiscence.  Stable appearance at the amputation site. No fracture or evidence of osteomyelitis.   Electronically Signed   By: Rudie Meyer M.D.   On: 10/10/2014 22:36   Dg Foot Complete Right  10/01/2014   CLINICAL DATA:  Right foot pain and swelling after recent amputation on 09/20/2014.  EXAM: RIGHT FOOT COMPLETE - 3+ VIEW  COMPARISON:  09/15/2014  FINDINGS: The patient is status post transmetatarsal amputation of all 5 toes across the proximal metatarsal level. Some irregularity of overlying soft tissues is identified without evidence of soft tissue gas or foreign body. Bony amputation margins appear unremarkable with no evidence of bony destruction. No fractures identified.  IMPRESSION: No acute findings by x-ray of the right foot status post recent transmetatarsal amputation.   Electronically Signed   By: Irish Lack M.D.   On: 10/01/2014 14:28    Keymani Mclean, PA-S  [facility] Pager:[phone]  If 7PM-7AM, please contact night-coverage [email or web address] Password [email or web address] 10/17/2014, 12:03 PM   LOS: 5 days

## 2014-10-17 NOTE — Evaluation (Signed)
Physical Therapy Evaluation Patient Details Name: Tyler Burgess MRN: [id number] DOB: 22-Jun-1955 Today's Date: 10/17/2014   History of Present Illness  59 yo male homeless adm with comes in after falling down some stairs several days ago, injuring his right foot, had some dehiscence of wound then.Comes in today because since then he has had some swelling and redness develop. Pt reports "some flies got in it too", had maggots also in wound. Denies any fevers. 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 Pt currently with functional limitations due to the deficits listed below (see PT Problem List).  Pt will benefit from skilled PT to increase their independence and safety with mobility to allow discharge to the venue listed below.       Follow Up Recommendations SNF    Equipment Recommendations  None recommended by PT    Recommendations for Other Services       Precautions / Restrictions Precautions Precautions: Fall      Mobility  Bed Mobility Overal bed mobility: Needs Assistance Bed Mobility: Supine to Sit     Supine to sit: Supervision     General bed mobility comments: for safety  Transfers Overall transfer level: Needs assistance Equipment used: Rolling walker (2 wheeled) Transfers: Sit to/from [employer] Sit to Stand: Min assist Stand pivot transfers: Min assist       General transfer comment: cues for hadn placement and safety  Ambulation/Gait             General Gait Details: NT d/t pain  Stairs            Wheelchair Mobility    Modified Rankin (Stroke Patients Only)       Balance Overall balance assessment: Needs assistance;History of Falls Sitting-balance support: Bilateral upper extremity supported;Single extremity supported Sitting balance-Leahy Scale: Good     Standing balance support: Bilateral upper  extremity supported Standing balance-Leahy Scale: Poor                               Pertinent Vitals/Pain Pain Assessment: 0-10 Pain Score: 10-Worst pain ever Pain Descriptors / Indicators: Stabbing Pain Intervention(s): Repositioned;Patient requesting pain meds-RN notified    Home Living Family/patient expects to be discharged to:: Private residence Living Arrangements: Alone   Type of Home: Homeless         Home Equipment: Environmental consultant - 2 wheels Additional Comments: pt reports he went to rehab after last hospital stay    Prior Function Level of Independence: Independent with assistive device(s);Independent               Hand Dominance        Extremity/Trunk Assessment   Upper Extremity Assessment: Defer to OT evaluation           Lower Extremity Assessment: RLE deficits/detail RLE Deficits / Details: RBKA, unable to flex fully at knee d/t pain; knee ext strength NT d/t pain; hip grossly WFL       Communication   Communication: No difficulties  Cognition Arousal/Alertness: Awake/alert Behavior During Therapy: WFL for tasks assessed/performed Overall Cognitive Status: Within Functional Limits for tasks assessed                      General Comments      Exercises  Assessment/Plan    PT Assessment Patient needs continued PT services  PT Diagnosis Difficulty walking;Hemiplegia dominant side;Acute pain;Abnormality of gait   PT Problem List Decreased strength;Decreased activity tolerance;Decreased balance;Decreased mobility;Decreased knowledge of use of DME;Pain  PT Treatment Interventions DME instruction;Gait training;Stair training;Functional mobility training;Therapeutic activities;Therapeutic exercise;Balance training;Patient/family education   PT Goals (Current goals can be found in the Care Plan section) Acute Rehab PT Goals Patient Stated Goal: decr pain PT Goal Formulation: With patient Time For Goal Achievement:  10/31/14 Potential to Achieve Goals: Good    Frequency Min 3X/week   Barriers to discharge Decreased caregiver support      Co-evaluation               End of Session Equipment Utilized During Treatment: Gait belt Activity Tolerance: Patient tolerated treatment well Patient left: with call bell/phone within reach;Other (comment);with nursing/sitter in room [facility]) Nurse Communication: Mobility status         Time: 6962-9528 PT Time Calculation (min) (ACUTE ONLY): 10 min   Charges:   PT Evaluation $Initial PT Evaluation Tier I: 1 Procedure     PT G CodesDrucilla Chalet 11/01/2014, 5:10 PM

## 2014-10-17 NOTE — Consult Note (Signed)
Psychiatry Consult Follow Up  Reason for Consult:  Needs medication for Major depressive disorder, recurrent and anxiety Referring Physician: Dr. Sharma Covert Patient Identification: Tyler Burgess MRN:  [id number] Principal Diagnosis: MDD-recurrent moderate, Generalized anxiety disorder Diagnosis:   Patient Active Problem List   Diagnosis Date Noted  . Major depressive disorder, recurrent episode, mild [F33.0]   . Wound infection [T79.8XXA] 10/12/2014  . Depression [F32.9]   . Diabetic ketoacidosis without coma associated with type 2 diabetes mellitus [E13.10]   . Weakness [R53.1]   . DKA (diabetic ketoacidoses) [E13.10] 09/15/2014  . Hyponatremia [E87.1] 09/15/2014  . DKA, type 2, not at goal [E13.10] 09/15/2014  . Suicidal ideations [R45.851]   . Diarrhea [R19.7]   . Enteritis due to Clostridium difficile [A04.7] 09/05/2014  . Osteomyelitis [M86.9] 08/31/2014  . Sepsis [A41.9] 08/31/2014  . Substance abuse [F19.10]   . Suicidal ideation [R45.851]   . Type 2 diabetes mellitus with hyperglycemia [E11.65] 04/15/2014  . Suicidal behavior [F48.9] 04/11/2014  . 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 GAD (generalized anxiety disorder) [F41.1] 04/11/2014  . Alcohol use disorder, severe, dependence [F10.20] 04/11/2014  . Opioid use disorder, moderate, in sustained remission [F11.90] 04/11/2014  . Opioid dependence in remission [F11.21] 01/10/2014  . Severe major depression without psychotic features [F32.2] 01/09/2014  . 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 Type 2 diabetes mellitus with peripheral neuropathy [E11.40] 12/11/2013  . 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 male patient admitted with post surgical foot infection .  HPI:  Patient is  59 year old male with history of diabetes, depression and anxiety. Thanks for asking me  to evaluate the patient for medication management. Patient reports that he has not been taking his mental health medications regularly for the past one month. However, he reports that his medications helped when he was compliant and would like to get back on them. He reports vague depressive symptoms, anxiety, excessive worries, difficulty sleeping, apprehension and poor concentration. He is currently homeless, in poor medical health and poor financial state. Patient reports history of alcohol abuse but not in the past few weeks. Currently, he denies suicidal ideations, psychosis or delusional thinking.  Interval History: Patient seen face-to-face for the psychiatric consultation follow-up and case discussed with psych social service. Patient is awake, alert, oriented to time place person and situation. Patient appeared to be somewhat relieved after the procedure. Patient has status post below knee surgery on 10/16/2014. Patient has been feeling better since he had surgery yesterday and looking forward for controlling pain from surgery and out of home placement which he offered for 60 days as per patient. Patient reportedly denies current symptoms of depression, anxiety, mania and psychosis. Patient denies any distance of sleep and appetite. Patient has been in contact with his brother who seems to be visiting on and off. PaHas been  compliant with psychiatric medication without adverse effects.  Patient denied current suicidal/homicidal ideation, intention or plans.   Past Medical History:  Past Medical History  Diagnosis Date  . Diabetes mellitus without complication   . Depression     Past Surgical History  Procedure Laterality Date  . Right arm    . Cholecystectomy    . Amputation toe Right 09/03/2014    Procedure: AMPUTATION RIGHT GREAT TOE AND SECOND TOE;  Surgeon: Tyler Signs Md, Burgess;  Location: [facility];  Service: General;  Laterality: Right;  right great and second toes  . Amputation Right  09/20/2014    Procedure: RIGHT TRANSMETATARSAL AMPUTATION;  Surgeon: Tyler Minion, Burgess;  Location: [facility];  Service: Orthopedics;  Laterality: Right;   Family History:  Family History  Problem Relation Age of Onset  . Diabetes Mother   . Depression Father   . Depression Brother   . Diabetes Other   . Depression Other    Social History:  History  Alcohol Use  . Yes    Comment: once per month     History  Drug Use No    History   Social History  . Marital Status: Widowed    Spouse Name: N/A  . Number of Children: N/A  . Years of Education: N/A   Social History Main Topics  . Smoking status: Current Every Day Smoker -- 0.50 packs/day for 35 years    Types: Cigarettes  . Smokeless tobacco: Never Used  . Alcohol Use: Yes     Comment: once per month  . Drug Use: No  . Sexual Activity: No   Other Topics Concern  . None   Social History Narrative   Homeless and supposed to go to a Shelter in [place].  [facility] could not get medications right and so he took a loaded pistol into the center and had to be jailed for 9 days.  Gets around in a truck.  Lives in his truck and occasionally visits his brother.     Additional Social History:                          Allergies:  No Known Allergies  Labs:  Results for orders placed or performed during the hospital encounter of 10/12/14 (from the past 48 hour(s))  Glucose, capillary     Status: Abnormal   Collection Time: 10/15/14  5:06 PM  Result Value Ref Range   Glucose-Capillary 223 (H) 65 - 99 mg/dL  Vancomycin, trough     Status: Abnormal   Collection Time: 10/15/14  5:40 PM  Result Value Ref Range   Vancomycin Tr 22 (H) 10.0 - 20.0 ug/mL  Glucose, capillary     Status: None   Collection Time: 10/15/14  9:41 PM  Result Value Ref Range   Glucose-Capillary 93 65 - 99 mg/dL  Glucose, capillary     Status: Abnormal   Collection Time: 10/16/14  7:37 AM  Result Value Ref Range   Glucose-Capillary 131  (H) 65 - 99 mg/dL  Surgical pcr screen     Status: None   Collection Time: 10/16/14  7:42 AM  Result Value Ref Range   MRSA, PCR NEGATIVE NEGATIVE   Staphylococcus aureus NEGATIVE NEGATIVE    Comment:        The Xpert SA Assay (FDA approved for NASAL specimens in patients over 75 years of age), is one component of a comprehensive surveillance program.  Test performance has been validated by [facility] for patients greater than or equal to 51 year old. It is not intended to diagnose infection nor to guide or monitor treatment.   Glucose, capillary     Status: None   Collection Time: 10/16/14 12:20 PM  Result Value Ref Range   Glucose-Capillary 71 65 - 99 mg/dL  Glucose, capillary     Status: Abnormal   Collection Time: 10/16/14  3:54 PM  Result Value Ref Range   Glucose-Capillary 49 (L)  65 - 99 mg/dL  Glucose, capillary     Status: None   Collection Time: 10/16/14  4:30 PM  Result Value Ref Range   Glucose-Capillary 90 65 - 99 mg/dL  Glucose, capillary     Status: Abnormal   Collection Time: 10/16/14  5:45 PM  Result Value Ref Range   Glucose-Capillary 57 (L) 65 - 99 mg/dL  Glucose, capillary     Status: None   Collection Time: 10/16/14  6:35 PM  Result Value Ref Range   Glucose-Capillary 90 65 - 99 mg/dL  Glucose, capillary     Status: Abnormal   Collection Time: 10/16/14  8:01 PM  Result Value Ref Range   Glucose-Capillary 61 (L) 65 - 99 mg/dL  Glucose, capillary     Status: None   Collection Time: 10/16/14  8:19 PM  Result Value Ref Range   Glucose-Capillary 84 65 - 99 mg/dL  Glucose, capillary     Status: None   Collection Time: 10/16/14  9:23 PM  Result Value Ref Range   Glucose-Capillary 78 65 - 99 mg/dL   Comment 1 Notify RN    Comment 2 Document in Chart   Glucose, capillary     Status: Abnormal   Collection Time: 10/17/14  7:43 AM  Result Value Ref Range   Glucose-Capillary 312 (H) 65 - 99 mg/dL  CBC     Status: Abnormal   Collection Time: 10/17/14   8:53 AM  Result Value Ref Range   WBC 9.9 4.0 - 10.5 K/uL   RBC 2.74 (L) 4.22 - 5.81 MIL/uL   Hemoglobin 8.3 (L) 13.0 - 17.0 g/dL   HCT 26.5 (L) 39.0 - 52.0 %   MCV 96.7 78.0 - 100.0 fL   MCH 30.3 26.0 - 34.0 pg   MCHC 31.3 30.0 - 36.0 g/dL   RDW 14.2 11.5 - 15.5 %   Platelets 243 150 - 400 K/uL  Basic metabolic panel     Status: Abnormal   Collection Time: 10/17/14  8:53 AM  Result Value Ref Range   Sodium 134 (L) 135 - 145 mmol/L   Potassium 4.8 3.5 - 5.1 mmol/L   Chloride 101 101 - 111 mmol/L   CO2 26 22 - 32 mmol/L   Glucose, Bld 380 (H) 65 - 99 mg/dL   BUN 13 6 - 20 mg/dL   Creatinine, Ser 1.22 0.61 - 1.24 mg/dL   Calcium 8.0 (L) 8.9 - 10.3 mg/dL   GFR calc non Af Amer >60 >60 mL/min   GFR calc Af Amer >60 >60 mL/min    Comment: (NOTE) The eGFR has been calculated using the CKD EPI equation. This calculation has not been validated in all clinical situations. eGFR's persistently <60 mL/min signify possible Chronic Kidney Disease.    Anion gap 7 5 - 15  Glucose, capillary     Status: Abnormal   Collection Time: 10/17/14 11:21 AM  Result Value Ref Range   Glucose-Capillary 155 (H) 65 - 99 mg/dL    Vitals: Blood pressure 161/88, pulse 69, temperature 99 F (37.2 C), temperature source Oral, resp. rate 18, height _0  (1.778 m), weight 75.5 kg (166 lb 7.2 oz), SpO2 100 %.  Risk to Self: Is patient at risk for suicide?: No Risk to Others:   Prior Inpatient Therapy:   Prior Outpatient Therapy:    Current Facility-Administered Medications  Medication Dose Route Frequency Provider Last Rate Last Dose  . 0.9 %  sodium chloride infusion   Intravenous Continuous  Jonetta Osgood, Burgess 75 mL/hr at 10/17/14 0041 75 mL/hr at 10/17/14 0041  . 0.9 %  sodium chloride infusion   Intravenous Continuous Tyler Minion, Burgess 10 mL/hr at 10/17/14 0040 10 mL/hr at 10/17/14 0040  . acetaminophen (TYLENOL) tablet 650 mg  650 mg Oral Q6H PRN Tyler Minion, Burgess   650 mg at 10/17/14 1040   Or   . acetaminophen (TYLENOL) suppository 650 mg  650 mg Rectal Q6H PRN Tyler Minion, Burgess      . ARIPiprazole (ABILIFY) tablet 5 mg  5 mg Oral Daily Mojeed Akintayo   5 mg at 10/17/14 0906  . folic acid (FOLVITE) tablet 1 mg  1 mg Oral Daily Phillips Grout, Burgess   1 mg at 10/17/14 3810  . gabapentin (NEURONTIN) capsule 300 mg  300 mg Oral TID Jonetta Osgood, Burgess   300 mg at 10/17/14 1751  . heparin injection 5,000 Units  5,000 Units Subcutaneous 3 times per day Erline Hau, Burgess   5,000 Units at 10/17/14 0509  . HYDROmorphone (DILAUDID) injection 1 mg  1 mg Intravenous Q3H PRN Silver Huguenin Elgergawy, Burgess      . insulin aspart (novoLOG) injection 0-15 Units  0-15 Units Subcutaneous TID WC Jonetta Osgood, Burgess   3 Units at 10/17/14 1152  . insulin aspart protamine- aspart (NOVOLOG MIX 70/30) injection 20 Units  20 Units Subcutaneous BID WC Jonetta Osgood, Burgess   20 Units at 10/17/14 0805  . LORazepam (ATIVAN) injection 0.5 mg  0.5 mg Intravenous Q6H PRN Jonetta Osgood, Burgess   0.5 mg at 10/16/14 1021  . methocarbamol (ROBAXIN) tablet 500 mg  500 mg Oral Q6H PRN Tyler Minion, Burgess   500 mg at 10/17/14 1040   Or  . methocarbamol (ROBAXIN) 500 mg in dextrose 5 % 50 mL IVPB  500 mg Intravenous Q6H PRN Tyler Minion, Burgess      . metoCLOPramide (REGLAN) tablet 5-10 mg  5-10 mg Oral Q8H PRN Tyler Minion, Burgess       Or  . metoCLOPramide (REGLAN) injection 5-10 mg  5-10 mg Intravenous Q8H PRN Tyler Minion, Burgess      . multivitamin with minerals tablet 1 tablet  1 tablet Oral Daily Phillips Grout, Burgess   1 tablet at 10/17/14 [phone]  . ondansetron (ZOFRAN) tablet 4 mg  4 mg Oral Q6H PRN Phillips Grout, Burgess       Or  . ondansetron [facility]) injection 4 mg  4 mg Intravenous Q6H PRN Phillips Grout, Burgess   4 mg at 10/16/14 1914  . ondansetron (ZOFRAN) tablet 4 mg  4 mg Oral Q6H PRN Tyler Minion, Burgess       Or  . ondansetron [facility]) injection 4 mg  4 mg Intravenous Q6H PRN Meridee Score V, Burgess      . oxyCODONE (Oxy  IR/ROXICODONE) immediate release tablet 5-10 mg  5-10 mg Oral Q3H PRN Tyler Minion, Burgess   10 mg at 10/17/14 1040  . piperacillin-tazobactam (ZOSYN) IVPB 3.375 g  3.375 g Intravenous Q8H Orpah Greek, Burgess   3.375 g at 10/17/14 0507  . sertraline (ZOLOFT) tablet 50 mg  50 mg Oral Daily Mojeed Akintayo   50 mg at 10/17/14 0903  . thiamine (VITAMIN B-1) tablet 100 mg  100 mg Oral Daily Phillips Grout, Burgess   100 mg at 10/17/14 5277   Or  . thiamine (  B-1) injection 100 mg  100 mg Intravenous Daily Phillips Grout, Burgess   100 mg at 10/16/14 0911  . traZODone (DESYREL) tablet 50 mg  50 mg Oral QHS PRN Mojeed Akintayo   50 mg at 10/15/14 0034  . vancomycin (VANCOCIN) IVPB 750 mg/150 ml premix  750 mg Intravenous Q12H Berton Mount, RPH   750 mg at 10/17/14 1152    Musculoskeletal: Strength & Muscle Tone: within normal limits Gait & Station: normal Patient leans: N/A  Psychiatric Specialty Exam: Physical Exam  Review of Systems   Blood pressure 161/88, pulse 69, temperature 99 F (37.2 C), temperature source Oral, resp. rate 18, height _0  (1.778 m), weight 75.5 kg (166 lb 7.2 oz), SpO2 100 %.Body mass index is 23.88 kg/(m^2).  General Appearance: Casual  Eye Contact::  Good  Speech:  Clear and Coherent and Normal Rate  Volume:  Normal  Mood:  Euthymic  Affect:  Appropriate and Congruent  Thought Process:  Coherent, Goal Directed and Intact  Orientation:  Full (Time, Place, and Person)  Thought Content:  WDL  Suicidal Thoughts:  No  Homicidal Thoughts:  No  Memory:  Immediate;   Good Recent;   Good Remote;   Good  Judgement:  Fair  Insight:  Fair and Shallow  Psychomotor Activity:  Normal  Concentration:  Fair  Recall:  NA  Fund of Knowledge:Fair  Language: Good  Akathisia:  NA  Handed:  Right  AIMS (if indicated):     Assets:  Desire for Improvement  ADL's:  Intact  Cognition: WNL  Sleep:    fine    Medical Decision Making: Established Problem, Stable/Improving (1),  Review or order clinical lab tests (1), Review or order medicine tests (1), Review of Medication Regimen & Side Effects (2) and Review of New Medication or Change in Dosage (2)  Treatment Plan Summary: Daily contact with patient to assess and evaluate symptoms and progress in treatment. Continue Abilify 5 mg po daily for depressed mood, Zoloft 50 mg po daily for depression,  Continue Cogentin 0.5 mg po daily for EPS and Trazodone 81m Qhs prn insomnia. Appreciate psychiatric consultation and follow up as clinically required Please contact [phone] or [phone] if needs further assistance  Disposition: Patient benefit from SNF for rehab treatment when medically stable and then patient will be referred to the outpatient psychiatric medication management..Lisette GrinderR. 10/17/2014 12:51 PM

## 2014-10-17 NOTE — Clinical Social Work Psych Note (Signed)
Clinical Social Worker Psych Service Line Progress Note  Clinical Social Worker: Boone Master, [place] Date/Time: 10/17/2014, 11:21 AM   Review of Patient  Overall Medical Condition:  Patient had BKA yesterday and reports pain today. Patient is not medically stable to DC.  Participation Level:  Active Participation Quality: Appropriate Other Participation Quality:  Patient engaged during assessment with appropriate eye contact and affect.  Affect: Appropriate Cognitive: Appropriate Reaction to Medications/Concerns:  Patient reports he has not been on depression medication long enough to know if it is beneficial or not.  Modes of Intervention: Support   Summary of Progress/Plan at Discharge  Summary of Progress/Plan at Discharge:  CSW met with patient at bedside. Patient sitting on the edge of his bed and eating lunch when CSW arrived. Patient reports he had surgery late last night and is still in pain but glad that surgery is over. CSW and patient spoke about anxiety related to surgery and patient reports that he is less anxious and depressed today knowing that surgery went well and he can move on with his life. Patient reports he is hopeful to have a speedy recovery and not have any further problems with wound.  Patient reports he continues to feel depressed about living situation and does not want to return to living in his truck. Patient reports he has more hope about the future due to completing disability application and Medicaid application. Patient reports once he is approved he wants to move to an apartment near his children in [place]. Patient reports children will not allow him to stay with them but that he would like to live nearby. Patient reports that he plans to be compliant with psychotropic medications because he knows it is beneficial for his overall health. Patient feels he left SNF in the past due to depression and not being complaint with treatment. Patient  recognizes that he needs assistance to be successful. Patient denies any SI or HI and contracts for safety at this time.  CSW explained LOG process and no offers at this time. CSW spoke with Gwendalyn Ege Col[place]) who will come to evaluate patient today in order to extend PASARR number that expires on 10/20/14. CSW will continue to follow to provide support and assist with DC planning.

## 2014-10-18 LAB — CULTURE, BLOOD (ROUTINE X 2)
Culture: NO GROWTH
Culture: NO GROWTH
SPECIAL REQUESTS: NORMAL

## 2014-10-18 LAB — GLUCOSE, CAPILLARY
GLUCOSE-CAPILLARY: 159 mg/dL — AB (ref 65–99)
GLUCOSE-CAPILLARY: 216 mg/dL — AB (ref 65–99)
Glucose-Capillary: 197 mg/dL — ABNORMAL HIGH (ref 65–99)

## 2014-10-18 MED ORDER — HYDRALAZINE HCL 25 MG PO TABS
25.0000 mg | ORAL_TABLET | Freq: Four times a day (QID) | ORAL | Status: DC
  Administered 2014-10-18: 25 mg via ORAL
  Filled 2014-10-18 (×4): qty 1

## 2014-10-18 MED ORDER — FERROUS SULFATE 325 (65 FE) MG PO TABS: 325.0000 mg | ORAL_TABLET | Freq: Two times a day (BID) | ORAL | Status: AC

## 2014-10-18 MED ORDER — SERTRALINE HCL 50 MG PO TABS: 50.0000 mg | ORAL_TABLET | Freq: Every day | ORAL | Status: AC

## 2014-10-18 MED ORDER — ACETAMINOPHEN 325 MG PO TABS: 650.0000 mg | ORAL_TABLET | Freq: Four times a day (QID) | ORAL | Status: AC | PRN

## 2014-10-18 MED ORDER — THIAMINE HCL 100 MG PO TABS: 100.0000 mg | ORAL_TABLET | Freq: Every day | ORAL | Status: AC

## 2014-10-18 MED ORDER — INSULIN ASPART PROT & ASPART (70-30 MIX) 100 UNIT/ML ~~LOC~~ SUSP: 20.0000 [IU] | Freq: Two times a day (BID) | SUBCUTANEOUS | Status: AC

## 2014-10-18 MED ORDER — ONDANSETRON HCL 4 MG PO TABS: 4.0000 mg | ORAL_TABLET | Freq: Four times a day (QID) | ORAL | Status: AC | PRN

## 2014-10-18 MED ORDER — FOLIC ACID 1 MG PO TABS: 1.0000 mg | ORAL_TABLET | Freq: Every day | ORAL | Status: AC

## 2014-10-18 MED ORDER — OXYCODONE HCL 5 MG PO TABS: 5.0000 mg | ORAL_TABLET | ORAL | Status: AC | PRN

## 2014-10-18 MED ORDER — INSULIN ASPART 100 UNIT/ML ~~LOC~~ SOLN: 0.0000 [IU] | Freq: Three times a day (TID) | SUBCUTANEOUS | Status: AC

## 2014-10-18 MED ORDER — HYDRALAZINE HCL 25 MG PO TABS: 25.0000 mg | ORAL_TABLET | Freq: Four times a day (QID) | ORAL | Status: AC

## 2014-10-18 NOTE — Progress Notes (Signed)
Assessment unchanged. [facility] waiting for pt at front entrance. Packet given to pt as prepared by SW to present at [employer] upon arrival with verbalized understanding. Discharged via wc to meet [facility] [place]. Accompanied by NT. Darl Pikes, RN @ [place] facility called and made aware of last prn med given and that pt on the way.

## 2014-10-18 NOTE — Progress Notes (Signed)
ANTIBIOTIC CONSULT NOTE - follow up  Pharmacy Consult for Vancomycin and Zosyn Indication: OM/wound infection  No Known Allergies  Patient Measurements: Height:  (177.8 cm) Weight: 166 lb 7.2 oz (75.5 kg) IBW/kg (Calculated) : 73  Vital Signs: Temp: 98.9 F (37.2 C) (06/23 0445) Temp Source: Oral (06/22 2156) BP: 139/85 mmHg (06/23 0933) Pulse Rate: 75 (06/23 0933) Intake/Output from previous day: 06/22 0701 - 06/23 0700 In: 1839.6 [P.O.:720; I.V.:1119.6] Out: 3725 [Urine:3725] Intake/Output from this shift: Total I/O In: 240 [P.O.:240] Out: 0   Labs:  Recent Labs  10/17/14 0853  WBC 9.9  HGB 8.3*  PLT 243  CREATININE 1.22   Estimated Creatinine Clearance: 69 mL/min (by C-G formula based on Cr of 1.22).  Recent Labs  10/15/14 1740  VANCOTROUGH 22*     Microbiology: Recent Results (from the past 720 hour(s))  Surgical pcr screen     Status: None   Collection Time: 09/20/14  4:43 AM  Result Value Ref Range Status   MRSA, PCR NEGATIVE NEGATIVE Final   Staphylococcus aureus NEGATIVE NEGATIVE Final    Comment:        The Xpert SA Assay (FDA approved for NASAL specimens in patients over 75 years of age), is one component of a comprehensive surveillance program.  Test performance has been validated by [facility] for patients greater than or equal to 61 year old. It is not intended to diagnose infection nor to guide or monitor treatment.   Culture, blood (routine x 2)     Status: None   Collection Time: 09/25/14  7:50 AM  Result Value Ref Range Status   Specimen Description BLOOD RIGHT ARM  Final   Special Requests BOTTLES DRAWN AEROBIC ONLY 5CC  Final   Culture   Final    NO GROWTH 5 DAYS Performed at [facility]    Report Status 10/01/2014 FINAL  Final  Culture, blood (routine x 2)     Status: None   Collection Time: 09/25/14  8:00 AM  Result Value Ref Range Status   Specimen Description BLOOD RIGHT ARM  Final   Special Requests  BOTTLES DRAWN AEROBIC ONLY 5CC  Final   Culture   Final    NO GROWTH 5 DAYS Performed at [facility]    Report Status 10/01/2014 FINAL  Final  Culture, blood (routine x 2)     Status: None (Preliminary result)   Collection Time: 10/12/14  2:04 PM  Result Value Ref Range Status   Specimen Description BLOOD LEFT ARM  Final   Special Requests Normal 10CC EACH BOTTLE  Final   Culture NO GROWTH 4 DAYS  Final   Report Status PENDING  Incomplete  Culture, blood (routine x 2)     Status: None (Preliminary result)   Collection Time: 10/12/14  2:08 PM  Result Value Ref Range Status   Specimen Description BLOOD RIGHT ARM  Final   Special Requests   Final    Normal BOTTLES DRAWN AEROBIC AND ANAEROBIC 9 CC EACH BOTTLE   Culture NO GROWTH 4 DAYS  Final   Report Status PENDING  Incomplete  Wound culture     Status: None   Collection Time: 10/12/14  3:10 PM  Result Value Ref Range Status   Specimen Description WOUND FOOT  Final   Special Requests NONE  Final   Gram Stain   Final    NO WBC SEEN NO SQUAMOUS EPITHELIAL CELLS SEEN RARE GRAM NEGATIVE RODS FEW GRAM POSITIVE  COCCI IN PAIRS RARE GRAM POSITIVE RODS    Culture   Final    MULTIPLE ORGANISMS PRESENT, NONE PREDOMINANT Note: NO STAPHYLOCOCCUS AUREUS ISOLATED NO GROUP A STREP (S.PYOGENES) ISOLATED Performed at [facility]    Report Status 10/17/2014 FINAL  Final  Surgical pcr screen     Status: None   Collection Time: 10/16/14  7:42 AM  Result Value Ref Range Status   MRSA, PCR NEGATIVE NEGATIVE Final   Staphylococcus aureus NEGATIVE NEGATIVE Final    Comment:        The Xpert SA Assay (FDA approved for NASAL specimens in patients over 63 years of age), is one component of a comprehensive surveillance program.  Test performance has been validated by [facility] for patients greater than or equal to 70 year old. It is not intended to diagnose infection nor to guide or monitor treatment.    Medical  History: Past Medical History  Diagnosis Date  . Diabetes mellitus without complication   . 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  He's currently on vancomycin and zosyn day #7.  Ortho recommends to d/c  abx 48 hours post-op.  Tmax 99, WBC wnl, SCr 1.22 (crcl~69)  6/17 >> vanco>> 6/17 >> zosyn>>  6/17 bcx x2: NGTD 6/17 wound: multi organisms FINAL  Level/dose changes: 5/24 0845 VT = 18mcg/ml on 1gm q12h 6/20 1700 VT = 40mcg/ml on 1g q12 (prior to 6th dose), change to 750mg  q12h   Goal of Therapy:  vanc trough 15-20 mcg/mL  Plan:  - cont vanco 750mg  q12h - cont zosyn EI - Consider d/c abx after today's doses (48 hrs post-op)

## 2014-10-18 NOTE — Discharge Summary (Signed)
Tyler Burgess, is a 59 y.o. male  DOB Apr 22, 1956  MRN [id number].  Admission date:  10/12/2014  Admitting Physician  Haydee Monica, MD  Discharge Date:  10/18/2014   Primary MD  No PCP Per Patient  Recommendations for primary care physician /skilled nursing facility physician for things to follow:  - Titrate insulin regimen for optimal blood sugar control - Check labs including CBC, BMP during next visit - Dr. Lajoyce Corners will change wound dressing at his office during next visit.   Admission Diagnosis  Hyperglycemia [R73.9] Wound infection, initial encounter [T79.8XXA]   Discharge Diagnosis  Hyperglycemia [R73.9] Wound infection, initial encounter [T79.8XXA]    Principal Problem:   Wound infection Active Problems:   Type 2 diabetes mellitus with peripheral neuropathy   Nonpsychotic mental disorder   GAD (generalized anxiety disorder)   Alcohol use disorder, severe, dependence   Major depressive disorder, recurrent episode, mild      Past Medical History  Diagnosis Date  . Diabetes mellitus without complication   . Depression     Past Surgical History  Procedure Laterality Date  . Right arm    . Cholecystectomy    . Amputation toe Right 09/03/2014    Procedure: AMPUTATION RIGHT GREAT TOE AND SECOND TOE;  Surgeon: Franky Macho Md, MD;  Location: [facility];  Service: General;  Laterality: Right;  right great and second toes  . Amputation Right 09/20/2014    Procedure: RIGHT TRANSMETATARSAL AMPUTATION;  Surgeon: Nadara Mustard, MD;  Location: [facility];  Service: Orthopedics;  Laterality: Right;  . Amputation Right 10/16/2014    Procedure: AMPUTATION BELOW KNEE;  Surgeon: Nadara Mustard, MD;  Location: [facility];  Service: Orthopedics;  Laterality: Right;       History of present illness and  Hospital Course:     Kindly see H&P for history of present illness and admission details, please review complete  Labs, Consult reports and Test reports for all details in brief  HPI  from the history and physical done on the day of admission 6/17 by Dr. Tarry Kos 59 yo male homeless with IDDM, right metatarsal amputation 5/26 at [facility] comes in after falling down some stairs several days ago, injuring his right foot, had some dehiscence of wound then. Comes in today because since then he has had some swelling and redness develop. Pt reports "some flies got in it too", had maggots also in wound. Denies any fevers. No significant pain. Pt is homeless, cant afford any of his medications not taking his insulin. Does not have disability yet. Amputation was done by dr duda at [facility].   Hospital Course   Right foot wound dehiscence at site of metatarsal amputation with infection: It initially with empiric vancomycin and Zosyn , Blood cultures have no growth to date. 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 Continue with prn narcotics - Patient to follow with Dr. Aldean Baker regarding his wound , at this point no indication for further antibiotics as discussed with Dr.  Lajoyce Corners, and he will change his wound dressing during next visit .  Active Problems: Type 2 diabetes mellitus with peripheral neuropathy: Uncontrolled, last A1c 13.0 on 09/16/14. Continue Insulin 70/30 20 units BID. Continue Neurontin for peripheral neuropathy.   Anemia: Likely secondary to chronic disease. hemoglobin is 8.on discharge, on iron supplements   Depression: Denies any suicidal or homicidal ideation. Claims unable to afford any of his psychiatric medications-therefore noncompliant. Seen by Psych-prior regimen of Abilify 5mg  po daily and Zoloft 50mg  po daily has been resumed.   Alcohol abuse:  no evidence of withdrawals during hospital stay, charge on thiamine, folic acid, multivitamins  Homelessness:social woevaluated    Antimicrobial agents  See below     Discharge Condition: Stable     Follow UP  Follow-up Information    Follow up with DUDA,MARCUS V, MD In 3 weeks.   Specialty:  Orthopedic Surgery   Contact information:   [address] [place] [address] [phone]         Discharge Instructions  and  Discharge Medications         Discharge Instructions    Diet - low sodium heart healthy    Complete by:  As directed      Discharge instructions    Complete by:  As directed   Follow with MD at at skilled nursing facility  Get CBC, CMP, 2 view Chest X ray checked  by Primary MD next visit.    Activity: As tolerated with Full fall precautions use walker/cane & assistance as needed   Disposition SNF   Diet: Heart Healthy  , with feeding assistance and aspiration precautions.  For Heart failure patients - Check your Weight same time everyday, if you gain over 2 pounds, or you develop in leg swelling, experience more shortness of breath or chest pain, call your Primary MD immediately. Follow Cardiac Low Salt Diet and 1.5 lit/day fluid restriction.   On your next visit with your primary care physician please Get Medicines reviewed and adjusted.   Please request your Prim.MD to go over all Hospital Tests and Procedure/Radiological results at the follow up, please get all Hospital records sent to your Prim MD by signing hospital release before you go home.   If you experience worsening of your admission symptoms, develop shortness of breath, life threatening emergency, suicidal or homicidal thoughts you must seek medical attention immediately by calling 911 or calling your MD immediately  if symptoms less severe.  You Must read complete instructions/literature along with all the possible adverse reactions/side effects for all the Medicines you take and that have been prescribed to you. Take any new Medicines after you have completely understood and accpet all the possible adverse reactions/side effects.   Do not drive, operating heavy  machinery, perform activities at heights, swimming or participation in water activities or provide baby sitting services if your were admitted for syncope or siezures until you have seen by Primary MD or a Neurologist and advised to do so again.  Do not drive when taking Pain medications.    Do not take more than prescribed Pain, Sleep and Anxiety Medications  Special Instructions: If you have smoked or chewed Tobacco  in the last 2 yrs please stop smoking, stop any regular Alcohol  and or any Recreational drug use.  Wear Seat belts while driving.   Please note  You were cared for by a hospitalist during your hospital stay. If you have any questions about your discharge medications or  the care you received while you were in the hospital after you are discharged, you can call the unit and asked to speak with the hospitalist on call if the hospitalist that took care of you is not available. Once you are discharged, your primary care physician will handle any further medical issues. Please note that NO REFILLS for any discharge medications will be authorized once you are discharged, as it is imperative that you return to your primary care physician (or establish a relationship with a primary care physician if you do not have one) for your aftercare needs so that they can reassess your need for medications and monitor your lab values.     Increase activity slowly    Complete by:  As directed             Medication List    STOP taking these medications        benztropine 0.5 MG tablet  Commonly known as:  COGENTIN     cephALEXin 500 MG capsule  Commonly known as:  KEFLEX     HYDROcodone-acetaminophen 5-325 MG per tablet  Commonly known as:  NORCO/VICODIN     insulin glargine 100 UNIT/ML injection  Commonly known as:  LANTUS     saccharomyces boulardii 250 MG capsule  Commonly known as:  FLORASTOR     senna-docusate 8.6-50 MG per tablet  Commonly known as:  Senokot-S      TAKE  these medications        acetaminophen 325 MG tablet  Commonly known as:  TYLENOL  Take 2 tablets (650 mg total) by mouth every 6 (six) hours as needed for mild pain (or Fever >/= 101).     ARIPiprazole 5 MG tablet  Commonly known as:  ABILIFY  Take 1 tablet (5 mg total) by mouth 2 (two) times daily.     aspirin 81 MG chewable tablet  Chew 1 tablet (81 mg total) by mouth daily.     ferrous sulfate 325 (65 FE) MG tablet  Commonly known as:  FERROUSUL  Take 1 tablet (325 mg total) by mouth 2 (two) times daily with a meal.     folic acid 1 MG tablet  Commonly known as:  FOLVITE  Take 1 tablet (1 mg total) by mouth daily.     gabapentin 300 MG capsule  Commonly known as:  NEURONTIN  Take 1 capsule (300 mg total) by mouth 3 (three) times daily.     hydrALAZINE 25 MG tablet  Commonly known as:  APRESOLINE  Take 1 tablet (25 mg total) by mouth 4 (four) times daily.     insulin aspart 100 UNIT/ML injection  Commonly known as:  novoLOG  Inject 0-15 Units into the skin 3 (three) times daily with meals.     insulin aspart protamine- aspart (70-30) 100 UNIT/ML injection  Commonly known as:  NOVOLOG MIX 70/30  Inject 0.2 mLs (20 Units total) into the skin 2 (two) times daily with a meal.     multivitamin with minerals Tabs tablet  Take 1 tablet by mouth daily.     ondansetron 4 MG tablet  Commonly known as:  ZOFRAN  Take 1 tablet (4 mg total) by mouth every 6 (six) hours as needed for nausea.     oxyCODONE 5 MG immediate release tablet  Commonly known as:  Oxy IR/ROXICODONE  Take 1-2 tablets (5-10 mg total) by mouth every 4 (four) hours as needed for severe pain or breakthrough pain.  sertraline 50 MG tablet  Commonly known as:  ZOLOFT  Take 1 tablet (50 mg total) by mouth daily.     thiamine 100 MG tablet  Take 1 tablet (100 mg total) by mouth daily.          Diet and Activity recommendation: See Discharge Instructions above   Consults obtained -  orthopedic  surgery   Major procedures and Radiology Report right transtibial amputation 10/16/14    - PLEASE review detailed and final reports for all details, in brief -   Ct Head Wo Contrast  09/22/2014   CLINICAL DATA:  Code stroke. Right arm numbness and weakness, acute onset. Initial encounter.  EXAM: CT HEAD WITHOUT CONTRAST  TECHNIQUE: Contiguous axial images were obtained from the base of the skull through the vertex without intravenous contrast.  COMPARISON:  None.  FINDINGS: There is no evidence of acute infarction, mass lesion, or intra- or extra-axial hemorrhage on CT.  Prominence of the ventricles and sulci reflects mild cortical volume loss. Mild periventricular white matter change likely reflects small vessel ischemic microangiopathy.  The brainstem and fourth ventricle are within normal limits. The basal ganglia are unremarkable in appearance. The cerebral hemispheres demonstrate grossly normal gray-white differentiation. No mass effect or midline shift is seen.  There is no evidence of fracture; visualized osseous structures are unremarkable in appearance. The orbits are within normal limits. The paranasal sinuses and mastoid air cells are well-aerated. No significant soft tissue abnormalities are seen.  IMPRESSION: 1. No acute intracranial pathology seen on CT. 2. Mild cortical volume loss and scattered small vessel ischemic microangiopathy.  These results were called by telephone at the time of interpretation on 09/22/2014 at 5:37 pm to Dr. Susa Raring, who verbally acknowledged these results.   Electronically Signed   By: Roanna Raider M.D.   On: 09/22/2014 17:37   Mr Brain Wo Contrast  09/23/2014   CLINICAL DATA:  RIGHT-sided weakness and slurred speech beginning Sep 22, 2014. Status post RIGHT foot partial amputation Sep 20, 2014. History of diabetes, severe alcohol use, osteomyelitis.  EXAM: MRI HEAD WITHOUT CONTRAST  TECHNIQUE: Multiplanar, multiecho pulse sequences of the brain and  surrounding structures were obtained without intravenous contrast.  COMPARISON:  CT head Sep 22, 2014  FINDINGS: Mild proportional prominence of the ventricles and sulci for age. No abnormal parenchymal signal, mass lesions, mass effect. No reduced diffusion to suggest acute ischemia. No susceptibility artifact to suggest hemorrhage.  No abnormal extra-axial fluid collections. No extra-axial masses though, contrast enhanced sequences would be more sensitive. Normal major intracranial vascular flow voids seen at the skull base.  Ocular globes and orbital contents are unremarkable though not tailored for evaluation. No abnormal sellar expansion. Mild paranasal sinus mucosal thickening. The mastoid air cells are well aerated. No suspicious calvarial bone marrow signal. No abnormal sellar expansion. Craniocervical junction maintained.  IMPRESSION: No acute intracranial process; specifically no acute ischemia.  Mild global parenchymal brain volume loss for age.   Electronically Signed   By: Awilda Metro M.D.   On: 09/23/2014 02:09   Mr Foot Right W Wo Contrast  10/13/2014   CLINICAL DATA:  Diabetic patient status post amputation at the bases of the metatarsals of the right foot 09/20/2014. Status post fall several days ago with a right foot injury and wound dehiscence. Redness, pain and swelling.  EXAM: MRI OF THE RIGHT FOOT WITHOUT AND WITH CONTRAST  TECHNIQUE: Multiplanar, multisequence MR imaging was performed both before and after administration of intravenous  contrast.  CONTRAST:  14 mL MULTIHANCE GADOBENATE DIMEGLUMINE 529 MG/ML IV SOLN  COMPARISON:  Plain films of the right foot 10/01/2014, 10/10/2014 and 10/12/2014.  FINDINGS: Postoperative change of amputation at the level the bases of all metatarsals is identified. There is a large open wound at the surgical site eccentric medially. The wound is centered over the remaining first, second and third metatarsals. In the soft tissues deep to the lateral  margin of the wound, a skin ulceration is seen along the lateral margin of the remaining fifth metatarsal with locules of gas in soft tissues deep to the ulceration. There is a thin collection of fluid versus granulation tissue tracking deep to this wound laterally at the level of the fifth metatarsal.  Intense marrow edema and enhancement are seen throughout all of the metatarsal remnants. Less intense marrow edema and enhancement are also seen in the periphery of the cuboid. A mild degree of edema and enhancement are noted in the calcaneus centered at the peroneal tubercle.  IMPRESSION: Status post amputation at the level the bases of metatarsals. Intense marrow edema and enhancement throughout all the metatarsals remnants is consistent with osteomyelitis. Milder degree of edema and enhancement in the lateral cuboid is also worrisome for osteomyelitis. Very mild edema and enhancement in the calcaneus could be secondary to reactive change or osteomyelitis.  Likely small soft tissue abscess lateral to the patient's large open wound.  Skin ulceration over the base of fifth metatarsal. Soft tissues deep to the ulceration and locules of gas.   Electronically Signed   By: Drusilla Kanner M.D.   On: 10/13/2014 08:23   Dg Chest Port 1 View  09/25/2014   CLINICAL DATA:  Fever.  EXAM: PORTABLE CHEST - 1 VIEW  COMPARISON:  09/01/2014  FINDINGS: The cardiomediastinal contours are normal. Linear atelectasis in the left greater than right lower lung zone. Pulmonary vasculature is normal. No consolidation, pleural effusion, or pneumothorax. A skin fold projects over the left hemithorax. No acute osseous abnormalities are seen.  IMPRESSION: Minimal subsegmental atelectasis at the lung bases. No consolidation to suggest pneumonia.   Electronically Signed   By: Rubye Oaks M.D.   On: 09/25/2014 06:30   Dg Foot Complete Right  10/12/2014   CLINICAL DATA:  Right foot amputation 09/12/2014. Pain. Erythema. Using.  EXAM:  RIGHT FOOT COMPLETE - 3+ VIEW  COMPARISON:  10/10/2014  FINDINGS: There is irregularity of the soft tissues along the amputation bed. There is some gas penetration along the soft tissues, similar to 10/10/2014. The foot is amputated at the proximal metatarsal metaphyseal level.  Soft tissue swelling along the stump and midfoot. No obvious osteolysis along the metatarsal bases.  IMPRESSION: 1. Speckled gas density centrally along the amputation bed extending back towards the metatarsal bases, question breakdown of the amputation bed flap. Localized soft tissue infection is not excluded. There is considerable soft tissue swelling in the midfoot and along the amputation bed. No bony destruction.   Electronically Signed   By: Gaylyn Rong M.D.   On: 10/12/2014 14:22   Dg Foot Complete Right  10/10/2014   CLINICAL DATA:  Recent metatarsal amputation. Fell. Pain and swelling.  EXAM: RIGHT FOOT COMPLETE - 3+ VIEW  COMPARISON:  10/01/2014  FINDINGS: Stable surgical changes from a proximal metatarsal midfoot amputation. There is air in the soft tissues which was not present on the prior film. This could be due to disruption of the wound. I do not see any definite findings for acute  fracture or osteomyelitis.  IMPRESSION: New areas noted in the soft tissues of the foot overlying the amputation. This could be due to disruption of the wound or wound dehiscence.  Stable appearance at the amputation site. No fracture or evidence of osteomyelitis.   Electronically Signed   By: Rudie Meyer M.D.   On: 10/10/2014 22:36   Dg Foot Complete Right  10/01/2014   CLINICAL DATA:  Right foot pain and swelling after recent amputation on 09/20/2014.  EXAM: RIGHT FOOT COMPLETE - 3+ VIEW  COMPARISON:  09/15/2014  FINDINGS: The patient is status post transmetatarsal amputation of all 5 toes across the proximal metatarsal level. Some irregularity of overlying soft tissues is identified without evidence of soft tissue gas or foreign  body. Bony amputation margins appear unremarkable with no evidence of bony destruction. No fractures identified.  IMPRESSION: No acute findings by x-ray of the right foot status post recent transmetatarsal amputation.   Electronically Signed   By: Irish Lack M.D.   On: 10/01/2014 14:28    Micro Results     Recent Results (from the past 240 hour(s))  Culture, blood (routine x 2)     Status: None   Collection Time: 10/12/14  2:04 PM  Result Value Ref Range Status   Specimen Description BLOOD LEFT ARM  Final   Special Requests Normal 10CC EACH BOTTLE  Final   Culture NO GROWTH 5 DAYS  Final   Report Status 10/18/2014 FINAL  Final  Culture, blood (routine x 2)     Status: None   Collection Time: 10/12/14  2:08 PM  Result Value Ref Range Status   Specimen Description BLOOD RIGHT ARM  Final   Special Requests   Final    Normal BOTTLES DRAWN AEROBIC AND ANAEROBIC 9 CC EACH BOTTLE   Culture NO GROWTH 5 DAYS  Final   Report Status 10/18/2014 FINAL  Final  Wound culture     Status: None   Collection Time: 10/12/14  3:10 PM  Result Value Ref Range Status   Specimen Description WOUND FOOT  Final   Special Requests NONE  Final   Gram Stain   Final    NO WBC SEEN NO SQUAMOUS EPITHELIAL CELLS SEEN RARE GRAM NEGATIVE RODS FEW GRAM POSITIVE COCCI IN PAIRS RARE GRAM POSITIVE RODS    Culture   Final    MULTIPLE ORGANISMS PRESENT, NONE PREDOMINANT Note: NO STAPHYLOCOCCUS AUREUS ISOLATED NO GROUP A STREP (S.PYOGENES) ISOLATED Performed at [facility]    Report Status 10/17/2014 FINAL  Final  Surgical pcr screen     Status: None   Collection Time: 10/16/14  7:42 AM  Result Value Ref Range Status   MRSA, PCR NEGATIVE NEGATIVE Final   Staphylococcus aureus NEGATIVE NEGATIVE Final    Comment:        The Xpert SA Assay (FDA approved for NASAL specimens in patients over 7 years of age), is one component of a comprehensive surveillance program.  Test performance has been  validated by [facility] for patients greater than or equal to 73 year old. It is not intended to diagnose infection nor to guide or monitor treatment.        Today   Subjective:   Jaxston Chohan today has no headache,no chest abdominal pain,no new weakness tingling or numbness, feels much better today, complaints of pain at amputation site .  Objective:   Blood pressure 133/77, pulse 78, temperature 99.6 F (37.6 C), temperature source Oral, resp. rate 16,  height 5\' 10"  (1.778 m), weight 75.5 kg (166 lb 7.2 oz), SpO2 98 %.   Intake/Output Summary (Last 24 hours) at 10/18/14 1359 Last data filed at 10/18/14 1356  Gross per 24 hour  Intake 1330.84 ml  Output   3625 ml  Net -2294.16 ml    Exam Gen Exam: Awake and alert with clear speech. Neck: Supple, No JVD.  Chest: B/L Clear.  CVS: S1 S2 Regular, no murmurs.  Abdomen: soft, BS +, non tender, non distended.  Extremities: no edema, lower extremities warm to touch. right lower extremity bandaged at the site of transtibial amputation . Neurologic: Non Focal.  Skin: No Rash.    Data Review   CBC w Diff:  Lab Results  Component Value Date   WBC 9.9 10/17/2014   HGB 8.3* 10/17/2014   HCT 26.5* 10/17/2014   PLT 243 10/17/2014   LYMPHOPCT 13 10/12/2014   MONOPCT 6 10/12/2014   EOSPCT 0 10/12/2014   BASOPCT 1 10/12/2014    CMP:  Lab Results  Component Value Date   NA 134* 10/17/2014   K 4.8 10/17/2014   CL 101 10/17/2014   CO2 26 10/17/2014   BUN 13 10/17/2014   CREATININE 1.22 10/17/2014   PROT 7.0 10/03/2014   ALBUMIN 3.1* 10/03/2014   BILITOT 0.3 10/03/2014   ALKPHOS 122 10/03/2014   AST 37 10/03/2014   ALT 44 10/03/2014  .   Total Time in preparing paper work, data evaluation and todays exam - 35 minutes  Keily Lepp M.D on 10/18/2014 at 1:59 PM  [facility]   Office  [phone]

## 2014-10-18 NOTE — Progress Notes (Signed)
Clinical Social Work  Patient accepted to [facility]. Director [facility]) agreeable to LOG and transportation to SNF. CSW prepared DC packet with FL2, DC summary, hard scripts and chart copy included. Patient reports he is very happy to go to [facility] in hopes that family can visit him there. Patient reports depression has stabilized and agreeable to remain compliant with medications.  RN to call report. CSW arranged transportation via [employer].  CSW is signing off but available if needed.  [place], [place] [phone]

## 2014-10-18 NOTE — Clinical Social Work Placement (Signed)
   CLINICAL SOCIAL WORK PLACEMENT  NOTE  Date:  10/18/2014  Patient Details  Name: Tyler Burgess MRN: [id number] Date of Birth: 12/11/1955  Clinical Social Work is seeking post-discharge placement for this patient at the Skilled  Nursing Facility level of care (*CSW will initial, date and re-position this form in  chart as items are completed):  Yes   Patient/family provided with [facility] Clinical Social Work Department's list of facilities offering this level of care within the geographic area requested by the patient (or if unable, by the patient's family).  Yes   Patient/family informed of their freedom to choose among providers that offer the needed level of care, that participate in Medicare, Medicaid or managed care program needed by the patient, have an available bed and are willing to accept the patient.  Yes   Patient/family informed of [facility]'s ownership interest in [facility] and [facility], as well as of the fact that they are under no obligation to receive care at these facilities.  PASRR submitted to EDS on 10/16/14     PASRR number received on 10/18/14     Existing PASRR number confirmed on       FL2 transmitted to all facilities in geographic area requested by pt/family on 10/16/14     FL2 transmitted to all facilities within larger geographic area on       Patient informed that his/her managed care company has contracts with or will negotiate with certain facilities, including the following:        Yes   Patient/family informed of bed offers received.  Patient chooses bed at [facility]     Physician recommends and patient chooses bed at      Patient to be transferred to [facility] on 10/18/14.  Patient to be transferred to facility by [facility]     Patient family notified on 10/18/14 of transfer.  Name of family member notified:  Patient reports he will inform family     PHYSICIAN       Additional  Comment:    _______________________________________________ Marnee Spring, LCSW 10/18/2014, 4:16 PM

## 2014-10-18 NOTE — Progress Notes (Signed)
Report called to [facility] and given to [place], Charity fundraiser. Awaiting [facility] to arrive to transport pt to rehab facility.

## 2014-10-18 NOTE — Discharge Instructions (Signed)
Follow with MD at at skilled nursing facility  Get CBC, CMP, 2 view Chest X ray checked  by Primary MD next visit.    Activity: As tolerated with Full fall precautions use walker/cane & assistance as needed   Disposition SNF   Diet: Heart Healthy  , with feeding assistance and aspiration precautions.  For Heart failure patients - Check your Weight same time everyday, if you gain over 2 pounds, or you develop in leg swelling, experience more shortness of breath or chest pain, call your Primary MD immediately. Follow Cardiac Low Salt Diet and 1.5 lit/day fluid restriction.   On your next visit with your primary care physician please Get Medicines reviewed and adjusted.   Please request your Prim.MD to go over all Hospital Tests and Procedure/Radiological results at the follow up, please get all Hospital records sent to your Prim MD by signing hospital release before you go home.   If you experience worsening of your admission symptoms, develop shortness of breath, life threatening emergency, suicidal or homicidal thoughts you must seek medical attention immediately by calling 911 or calling your MD immediately  if symptoms less severe.  You Must read complete instructions/literature along with all the possible adverse reactions/side effects for all the Medicines you take and that have been prescribed to you. Take any new Medicines after you have completely understood and accpet all the possible adverse reactions/side effects.   Do not drive, operating heavy machinery, perform activities at heights, swimming or participation in water activities or provide baby sitting services if your were admitted for syncope or siezures until you have seen by Primary MD or a Neurologist and advised to do so again.  Do not drive when taking Pain medications.    Do not take more than prescribed Pain, Sleep and Anxiety Medications  Special Instructions: If you have smoked or chewed Tobacco  in the last 2  yrs please stop smoking, stop any regular Alcohol  and or any Recreational drug use.  Wear Seat belts while driving.   Please note  You were cared for by a hospitalist during your hospital stay. If you have any questions about your discharge medications or the care you received while you were in the hospital after you are discharged, you can call the unit and asked to speak with the hospitalist on call if the hospitalist that took care of you is not available. Once you are discharged, your primary care physician will handle any further medical issues. Please note that NO REFILLS for any discharge medications will be authorized once you are discharged, as it is imperative that you return to your primary care physician (or establish a relationship with a primary care physician if you do not have one) for your aftercare needs so that they can reassess your need for medications and monitor your lab values.

## 2014-10-18 NOTE — Progress Notes (Signed)
Physical Therapy Treatment Patient Details Name: Tyler Burgess MRN: [id number] DOB: 27-Sep-1955 Today's Date: 10/18/2014    History of Present Illness 59 yo male homeless adm with comes in after falling down some stairs several days ago, injuring his right foot, had some dehiscence of wound then.Comes in today because since then he has had some swelling and redness develop. Pt reports "some flies got in it too", had maggots also in wound. Denies any fevers. 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 at [facility]     PT Comments    Pt progressing well today; encouraged R knee extension and pt educated on positioning of residual limb to avoid contranctures  Follow Up Recommendations  SNF     Equipment Recommendations  None recommended by PT    Recommendations for Other Services       Precautions / Restrictions Precautions Precautions: Fall    Mobility  Bed Mobility                  Transfers Overall transfer level: Needs assistance Equipment used: Rolling walker (2 wheeled) Transfers: Sit to/from Stand Sit to Stand: Min guard         General transfer comment: cues for hand placement and safety  Ambulation/Gait Ambulation/Gait assistance: Min assist;Min guard Ambulation Distance (Feet): 60 Feet Assistive device: Rolling walker (2 wheeled)       General Gait Details: cues for sequence and RW safety   Stairs            Wheelchair Mobility    Modified Rankin (Stroke Patients Only)       Balance             Standing balance-Leahy Scale: Poor Standing balance comment: pt requires UE support to maintain safe standing                    Cognition Arousal/Alertness: Awake/alert Behavior During Therapy: WFL for tasks assessed/performed Overall Cognitive Status: Within Functional Limits for tasks assessed                      Exercises      General Comments General comments (skin  integrity, edema, etc.): residula limb appears swollen; elevated after PT      Pertinent Vitals/Pain Pain Assessment: 0-10 Pain Score: 6  Pain Location: R leg, foot phantom pain Pain Descriptors / Indicators: Constant Pain Intervention(s): Limited activity within patient's tolerance;Monitored during session;Premedicated before session;Repositioned    Home Living                      Prior Function            PT Goals (current goals can now be found in the care plan section) Acute Rehab PT Goals Patient Stated Goal: decr pain PT Goal Formulation: With patient Time For Goal Achievement: 10/31/14 Potential to Achieve Goals: Good Progress towards PT goals: Progressing toward goals    Frequency  Min 3X/week    PT Plan Current plan remains appropriate;Frequency needs to be updated    Co-evaluation             End of Session Equipment Utilized During Treatment: Gait belt Activity Tolerance: Patient tolerated treatment well Patient left: in chair;with call bell/phone within reach;with nursing/sitter in room     Time: 4098-1191 PT Time Calculation (min) (ACUTE ONLY): 25 min  Charges:  $Gait Training: 23-37 mins  G CodesDrucilla Chalet 10/18/2014, 12:06 PM

## 2014-10-18 NOTE — Consult Note (Signed)
Psychiatry Consult Follow Up  Reason for Consult:  Needs medication for Major depressive disorder, recurrent and anxiety Referring Physician: Dr. Sharma Covert Patient Identification: Tyler Burgess MRN:  [id number] Principal Diagnosis: MDD-recurrent moderate, Generalized anxiety disorder Diagnosis:   Patient Active Problem List   Diagnosis Date Noted  . Major depressive disorder, recurrent episode, mild [F33.0]   . Wound infection [T79.8XXA] 10/12/2014  . Depression [F32.9]   . Diabetic ketoacidosis without coma associated with type 2 diabetes mellitus [E13.10]   . Weakness [R53.1]   . DKA (diabetic ketoacidoses) [E13.10] 09/15/2014  . Hyponatremia [E87.1] 09/15/2014  . DKA, type 2, not at goal [E13.10] 09/15/2014  . Suicidal ideations [R45.851]   . Diarrhea [R19.7]   . Enteritis due to Clostridium difficile [A04.7] 09/05/2014  . Osteomyelitis [M86.9] 08/31/2014  . Sepsis [A41.9] 08/31/2014  . Substance abuse [F19.10]   . Suicidal ideation [R45.851]   . Type 2 diabetes mellitus with hyperglycemia [E11.65] 04/15/2014  . Suicidal behavior [F48.9] 04/11/2014  . 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 GAD (generalized anxiety disorder) [F41.1] 04/11/2014  . Alcohol use disorder, severe, dependence [F10.20] 04/11/2014  . Opioid use disorder, moderate, in sustained remission [F11.90] 04/11/2014  . Opioid dependence in remission [F11.21] 01/10/2014  . Severe major depression without psychotic features [F32.2] 01/09/2014  . 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 Type 2 diabetes mellitus with peripheral neuropathy [E11.40] 12/11/2013  . 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 male patient admitted with post surgical foot infection .  HPI:  Patient is  59 year old male with history of diabetes, depression and anxiety. Thanks for asking me  to evaluate the patient for medication management. Patient reports that he has not been taking his mental health medications regularly for the past one month. However, he reports that his medications helped when he was compliant and would like to get back on them. He reports vague depressive symptoms, anxiety, excessive worries, difficulty sleeping, apprehension and poor concentration. He is currently homeless, in poor medical health and poor financial state. Patient reports history of alcohol abuse but not in the past few weeks. Currently, he denies suicidal ideations, psychosis or delusional thinking.  Interval History: Patient seen face-to-face for the psychiatric consultation follow-up along with psych social service. Patient has no complaints today and appeared sitting in a chair next to his bed and somewhat sleepy but easily awake and when walking into his room. Patient is awake, alert, oriented to time place person and situation throughout my evaluation. Patient appeared emotionally stable and expressed his satisfaction about finding a place close to his family members.  Patient has been feeling better since he had surgery yesterday and looking forward for controlling pain from surgery and out of home placement which he offered for 60 days as per patient. Patient denies current symptoms of depression, anxiety, mania and psychosis. Patient denies distance of sleep and appetite. Has been  compliant with psychiatric medication without adverse effects.  Patient denied current suicidal/homicidal ideation, intention or plans.   Past Medical History:  Past Medical History  Diagnosis Date  . Diabetes mellitus without complication   . Depression     Past Surgical History  Procedure Laterality Date  . Right arm    . Cholecystectomy    . Amputation toe Right 09/03/2014    Procedure: AMPUTATION RIGHT GREAT TOE AND SECOND TOE;  Surgeon: Aviva Signs Md, MD;  Location: [facility];  Service: General;  Laterality:  Right;  right great and second toes  . Amputation Right 09/20/2014    Procedure: RIGHT TRANSMETATARSAL AMPUTATION;  Surgeon: Newt Minion, MD;  Location: [facility];  Service: Orthopedics;  Laterality: Right;  . Amputation Right 10/16/2014    Procedure: AMPUTATION BELOW KNEE;  Surgeon: Newt Minion, MD;  Location: [facility];  Service: Orthopedics;  Laterality: Right;   Family History:  Family History  Problem Relation Age of Onset  . Diabetes Mother   . Depression Father   . Depression Brother   . Diabetes Other   . Depression Other    Social History:  History  Alcohol Use  . Yes    Comment: once per month     History  Drug Use No    History   Social History  . Marital Status: Widowed    Spouse Name: N/A  . Number of Children: N/A  . Years of Education: N/A   Social History Main Topics  . Smoking status: Current Every Day Smoker -- 0.50 packs/day for 35 years    Types: Cigarettes  . Smokeless tobacco: Never Used  . Alcohol Use: Yes     Comment: once per month  . Drug Use: No  . Sexual Activity: No   Other Topics Concern  . None   Social History Narrative   Homeless and supposed to go to a Shelter in [place].  [facility] could not get medications right and so he took a loaded pistol into the center and had to be jailed for 9 days.  Gets around in a truck.  Lives in his truck and occasionally visits his brother.     Additional Social History:      Allergies:  No Known Allergies  Labs:  Results for orders placed or performed during the hospital encounter of 10/12/14 (from the past 48 hour(s))  Glucose, capillary     Status: Abnormal   Collection Time: 10/16/14  3:54 PM  Result Value Ref Range   Glucose-Capillary 49 (L) 65 - 99 mg/dL  Glucose, capillary     Status: None   Collection Time: 10/16/14  4:30 PM  Result Value Ref Range   Glucose-Capillary 90 65 - 99 mg/dL  Glucose, capillary     Status: Abnormal   Collection Time: 10/16/14  5:45 PM  Result  Value Ref Range   Glucose-Capillary 57 (L) 65 - 99 mg/dL  Glucose, capillary     Status: None   Collection Time: 10/16/14  6:35 PM  Result Value Ref Range   Glucose-Capillary 90 65 - 99 mg/dL  Glucose, capillary     Status: Abnormal   Collection Time: 10/16/14  8:01 PM  Result Value Ref Range   Glucose-Capillary 61 (L) 65 - 99 mg/dL  Glucose, capillary     Status: None   Collection Time: 10/16/14  8:19 PM  Result Value Ref Range   Glucose-Capillary 84 65 - 99 mg/dL  Glucose, capillary     Status: None   Collection Time: 10/16/14  9:23 PM  Result Value Ref Range   Glucose-Capillary 78 65 - 99 mg/dL   Comment 1 Notify RN    Comment 2 Document in Chart   Glucose, capillary     Status: Abnormal   Collection Time: 10/17/14  7:43 AM  Result Value Ref Range   Glucose-Capillary 312 (H) 65 - 99 mg/dL  CBC  Status: Abnormal   Collection Time: 10/17/14  8:53 AM  Result Value Ref Range   WBC 9.9 4.0 - 10.5 K/uL   RBC 2.74 (L) 4.22 - 5.81 MIL/uL   Hemoglobin 8.3 (L) 13.0 - 17.0 g/dL   HCT 26.5 (L) 39.0 - 52.0 %   MCV 96.7 78.0 - 100.0 fL   MCH 30.3 26.0 - 34.0 pg   MCHC 31.3 30.0 - 36.0 g/dL   RDW 14.2 11.5 - 15.5 %   Platelets 243 150 - 400 K/uL  Basic metabolic panel     Status: Abnormal   Collection Time: 10/17/14  8:53 AM  Result Value Ref Range   Sodium 134 (L) 135 - 145 mmol/L   Potassium 4.8 3.5 - 5.1 mmol/L   Chloride 101 101 - 111 mmol/L   CO2 26 22 - 32 mmol/L   Glucose, Bld 380 (H) 65 - 99 mg/dL   BUN 13 6 - 20 mg/dL   Creatinine, Ser 1.22 0.61 - 1.24 mg/dL   Calcium 8.0 (L) 8.9 - 10.3 mg/dL   GFR calc non Af Amer >60 >60 mL/min   GFR calc Af Amer >60 >60 mL/min    Comment: (NOTE) The eGFR has been calculated using the CKD EPI equation. This calculation has not been validated in all clinical situations. eGFR's persistently <60 mL/min signify possible Chronic Kidney Disease.    Anion gap 7 5 - 15  Glucose, capillary     Status: Abnormal   Collection Time:  10/17/14 11:21 AM  Result Value Ref Range   Glucose-Capillary 155 (H) 65 - 99 mg/dL  Glucose, capillary     Status: Abnormal   Collection Time: 10/17/14  3:52 PM  Result Value Ref Range   Glucose-Capillary 112 (H) 65 - 99 mg/dL  Glucose, capillary     Status: None   Collection Time: 10/17/14  9:32 PM  Result Value Ref Range   Glucose-Capillary 83 65 - 99 mg/dL  Glucose, capillary     Status: Abnormal   Collection Time: 10/18/14  7:16 AM  Result Value Ref Range   Glucose-Capillary 159 (H) 65 - 99 mg/dL  Glucose, capillary     Status: Abnormal   Collection Time: 10/18/14 11:21 AM  Result Value Ref Range   Glucose-Capillary 216 (H) 65 - 99 mg/dL    Vitals: Blood pressure 133/77, pulse 78, temperature 99.6 F (37.6 C), temperature source Oral, resp. rate 16, height 5' 10"  (1.778 m), weight 75.5 kg (166 lb 7.2 oz), SpO2 98 %.  Risk to Self: Is patient at risk for suicide?: No Risk to Others:   Prior Inpatient Therapy:   Prior Outpatient Therapy:    Current Facility-Administered Medications  Medication Dose Route Frequency Provider Last Rate Last Dose  . 0.9 %  sodium chloride infusion   Intravenous Continuous Jonetta Osgood, MD   Stopped at 10/17/14 1628  . 0.9 %  sodium chloride infusion   Intravenous Continuous Newt Minion, MD 10 mL/hr at 10/17/14 1629    . acetaminophen (TYLENOL) tablet 650 mg  650 mg Oral Q6H PRN Newt Minion, MD   650 mg at 10/17/14 1722   Or  . acetaminophen (TYLENOL) suppository 650 mg  650 mg Rectal Q6H PRN Newt Minion, MD      . ARIPiprazole (ABILIFY) tablet 5 mg  5 mg Oral Daily Mojeed Akintayo   5 mg at 10/18/14 0936  . folic acid (FOLVITE) tablet 1 mg  1 mg Oral Daily Rachal  Rayvon Char, MD   1 mg at 10/18/14 0936  . gabapentin (NEURONTIN) capsule 300 mg  300 mg Oral TID Jonetta Osgood, MD   300 mg at 10/18/14 0945  . heparin injection 5,000 Units  5,000 Units Subcutaneous 3 times per day Erline Hau, MD   5,000 Units at 10/18/14  1319  . hydrALAZINE (APRESOLINE) tablet 25 mg  25 mg Oral 4 times per day Albertine Patricia, MD   25 mg at 10/18/14 0937  . HYDROmorphone (DILAUDID) injection 1 mg  1 mg Intravenous Q3H PRN Albertine Patricia, MD   1 mg at 10/18/14 1131  . insulin aspart (novoLOG) injection 0-15 Units  0-15 Units Subcutaneous TID WC Jonetta Osgood, MD   5 Units at 10/18/14 1131  . insulin aspart protamine- aspart (NOVOLOG MIX 70/30) injection 20 Units  20 Units Subcutaneous BID WC Jonetta Osgood, MD   20 Units at 10/18/14 0737  . LORazepam (ATIVAN) injection 0.5 mg  0.5 mg Intravenous Q6H PRN Jonetta Osgood, MD   0.5 mg at 10/18/14 0130  . methocarbamol (ROBAXIN) tablet 500 mg  500 mg Oral Q6H PRN Newt Minion, MD   500 mg at 10/17/14 2336   Or  . methocarbamol (ROBAXIN) 500 mg in dextrose 5 % 50 mL IVPB  500 mg Intravenous Q6H PRN Newt Minion, MD      . metoCLOPramide (REGLAN) tablet 5-10 mg  5-10 mg Oral Q8H PRN Newt Minion, MD       Or  . metoCLOPramide (REGLAN) injection 5-10 mg  5-10 mg Intravenous Q8H PRN Newt Minion, MD      . multivitamin with minerals tablet 1 tablet  1 tablet Oral Daily Phillips Grout, MD   1 tablet at 10/18/14 [phone]  . ondansetron (ZOFRAN) tablet 4 mg  4 mg Oral Q6H PRN Phillips Grout, MD       Or  . ondansetron [facility]) injection 4 mg  4 mg Intravenous Q6H PRN Phillips Grout, MD   4 mg at 10/16/14 1914  . ondansetron (ZOFRAN) tablet 4 mg  4 mg Oral Q6H PRN Newt Minion, MD       Or  . ondansetron [facility]) injection 4 mg  4 mg Intravenous Q6H PRN Meridee Score V, MD      . oxyCODONE (Oxy IR/ROXICODONE) immediate release tablet 5-10 mg  5-10 mg Oral Q3H PRN Newt Minion, MD   10 mg at 10/18/14 1252  . sertraline (ZOLOFT) tablet 50 mg  50 mg Oral Daily Mojeed Akintayo   50 mg at 10/18/14 0936  . thiamine (VITAMIN B-1) tablet 100 mg  100 mg Oral Daily Phillips Grout, MD   100 mg at 10/18/14 0936  . traZODone (DESYREL) tablet 50 mg  50 mg Oral QHS PRN Mojeed Akintayo   50  mg at 10/15/14 0034    Musculoskeletal: Strength & Muscle Tone: within normal limits Gait & Station: normal Patient leans: N/A  Psychiatric Specialty Exam: Physical Exam  Review of Systems   Blood pressure 133/77, pulse 78, temperature 99.6 F (37.6 C), temperature source Oral, resp. rate 16, height 5' 10"  (1.778 m), weight 75.5 kg (166 lb 7.2 oz), SpO2 98 %.Body mass index is 23.88 kg/(m^2).  General Appearance: Casual  Eye Contact::  Good  Speech:  Clear and Coherent and Normal Rate  Volume:  Normal  Mood:  Euthymic  Affect:  Appropriate and Congruent  Thought Process:  Coherent, Goal Directed and Intact  Orientation:  Full (Time, Place, and Person)  Thought Content:  WDL  Suicidal Thoughts:  No  Homicidal Thoughts:  No  Memory:  Immediate;   Good Recent;   Good Remote;   Good  Judgement:  Fair  Insight:  Fair and Shallow  Psychomotor Activity:  Normal  Concentration:  Fair  Recall:  NA  Fund of Knowledge:Fair  Language: Good  Akathisia:  NA  Handed:  Right  AIMS (if indicated):     Assets:  Desire for Improvement  ADL's:  Intact  Cognition: WNL  Sleep:    fine    Medical Decision Making: Established Problem, Stable/Improving (1), Review or order clinical lab tests (1), Review or order medicine tests (1), Review of Medication Regimen & Side Effects (2) and Review of New Medication or Change in Dosage (2)  Treatment Plan Summary: Continue Abilify 5 mg po daily for depressed mood, Zoloft 50 mg po daily for depression,  Continue Cogentin 0.5 mg po daily for EPS and Trazodone 66m Qhs prn insomnia. Appreciate psychiatric consultation and sign off  Please contact [phone] or [phone] if needs further assistance  Disposition: Patient benefit from SNF for rehab treatment when medically stable and then patient will be referred to the outpatient psychiatric medication management..Lisette GrinderR. 10/18/2014 2:58 PM

## 2014-10-23 ENCOUNTER — Encounter (HOSPITAL_COMMUNITY): Payer: Self-pay | Admitting: Orthopedic Surgery

## 2016-01-11 IMAGING — CT CT HEAD W/O CM
1 series · 15 of 30 positions shown, 19 images · non-contrast
Comparison: None.

CLINICAL DATA: Code stroke. Right arm numbness and weakness, acute
onset. Initial encounter.

EXAM:
CT HEAD WITHOUT CONTRAST
TECHNIQUE: Contiguous axial images were obtained from the base of the skull
through the vertex without intravenous contrast.

[Series 2: headseq 4.8 h45s · axial · 0.48mm/px · z∈[-126,+26]mm · 15 of 36 slices shown, 19 images]
[im 2/36  brain]
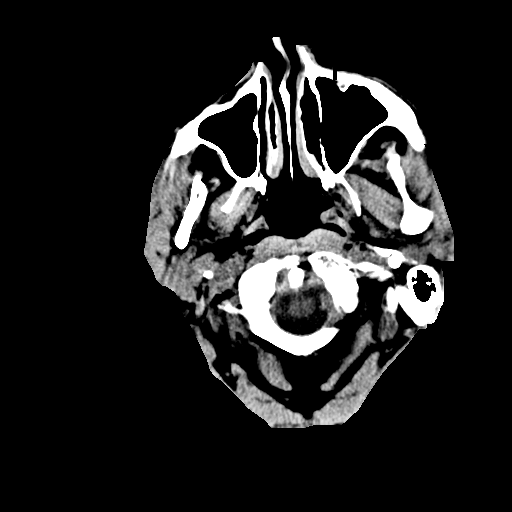
[im 2/36  bone]
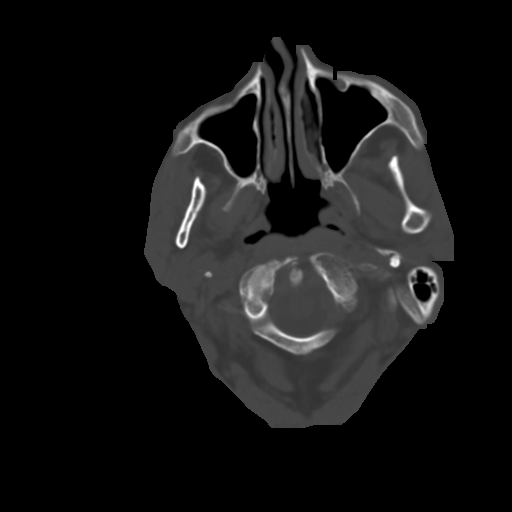
[im 4/36  brain]
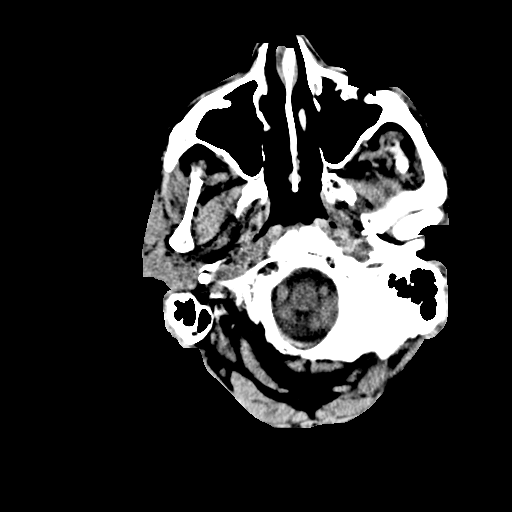
[im 7/36  brain]
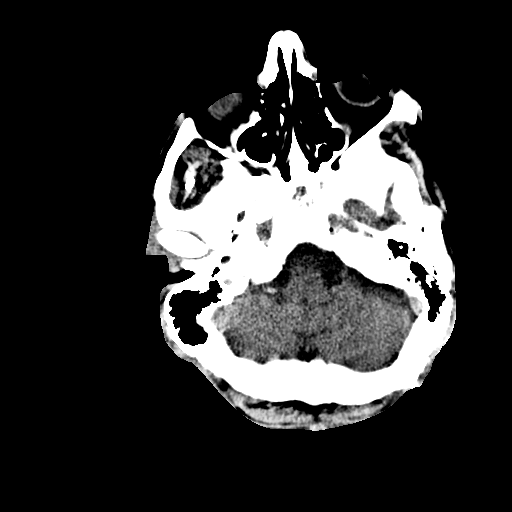
[im 9/36  brain]
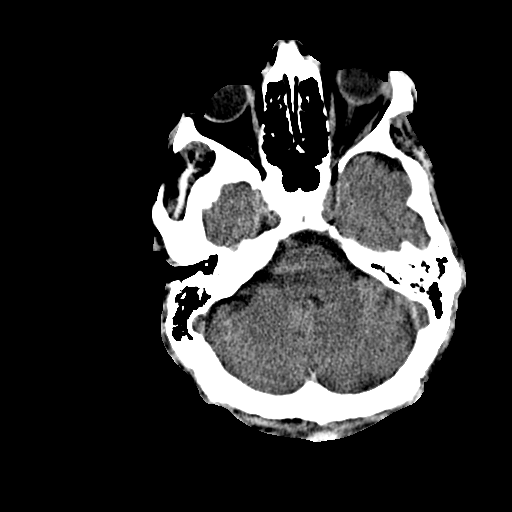
[im 11/36  brain]
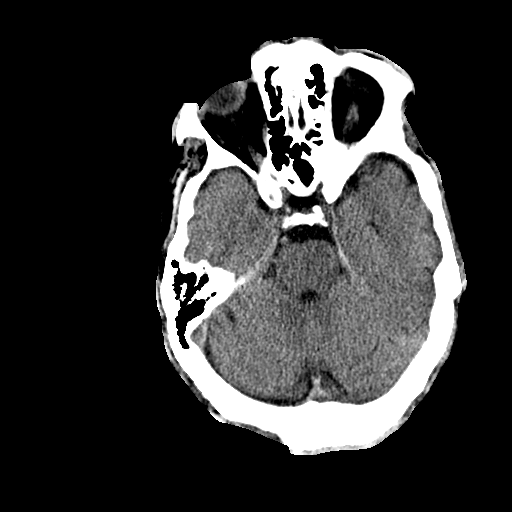
[im 11/36  bone]
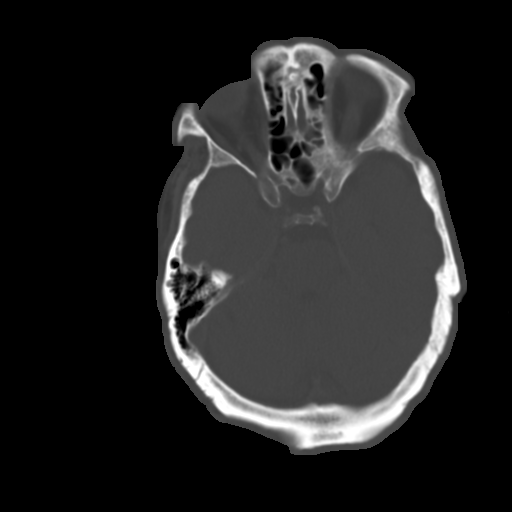
[im 14/36  brain]
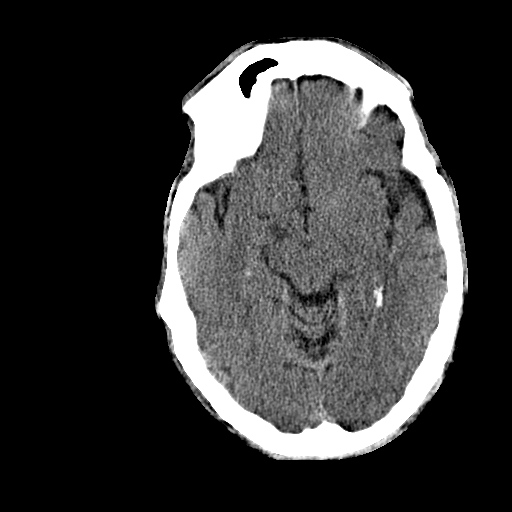
[im 16/36  brain]
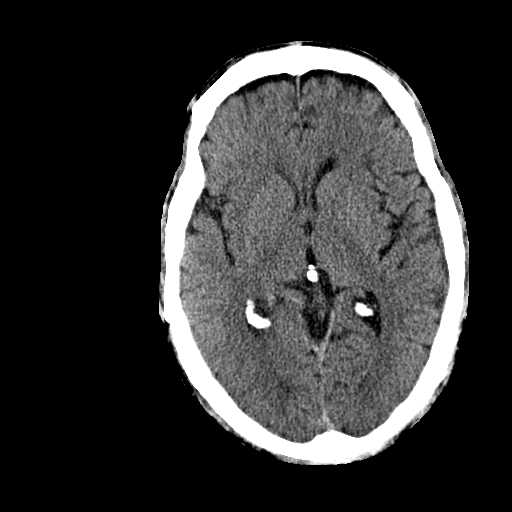
[im 19/36  brain]
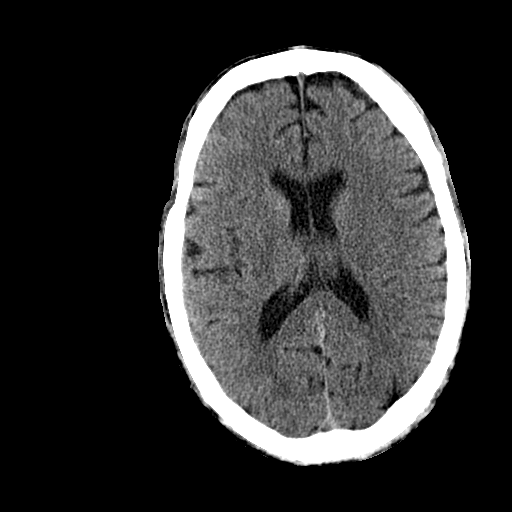
[im 20/36  brain]
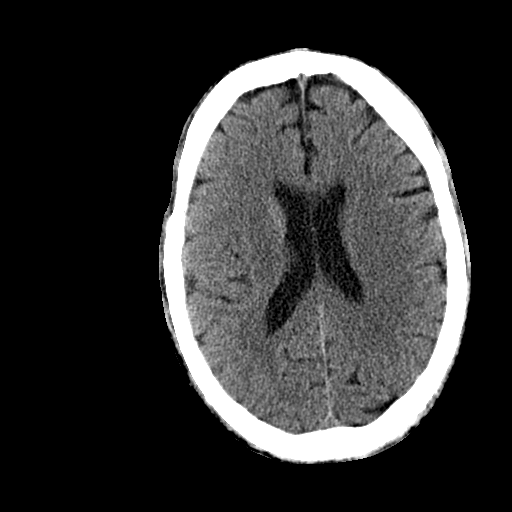
[im 20/36  bone]
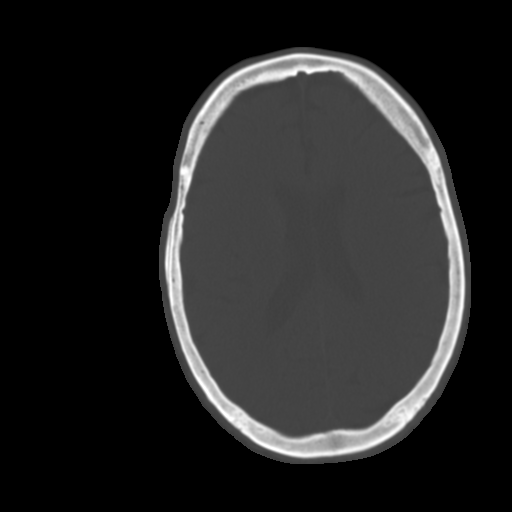
[im 22/36  brain]
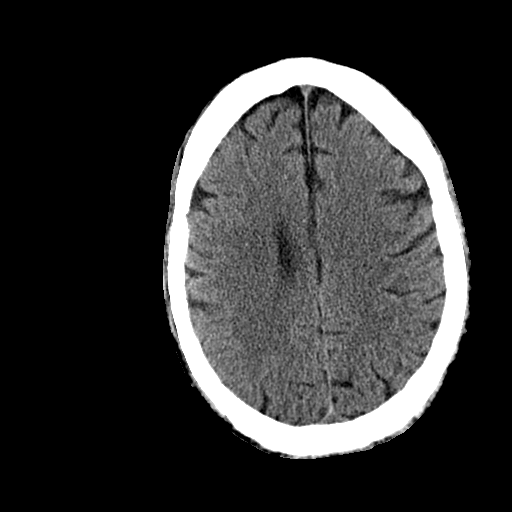
[im 25/36  brain]
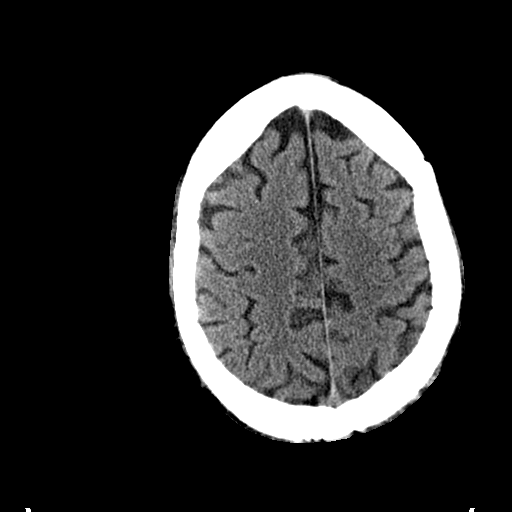
[im 27/36  brain]
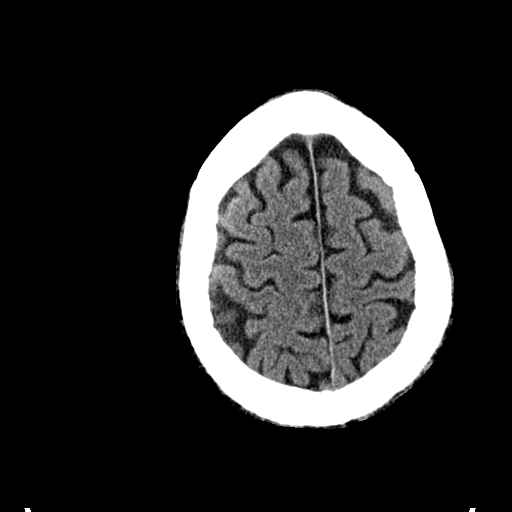
[im 29/36  brain]
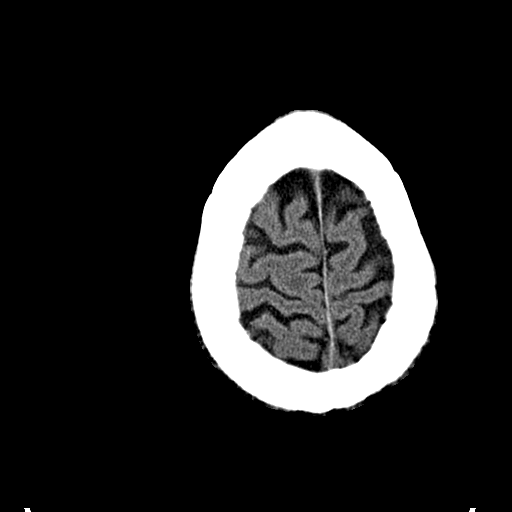
[im 29/36  bone]
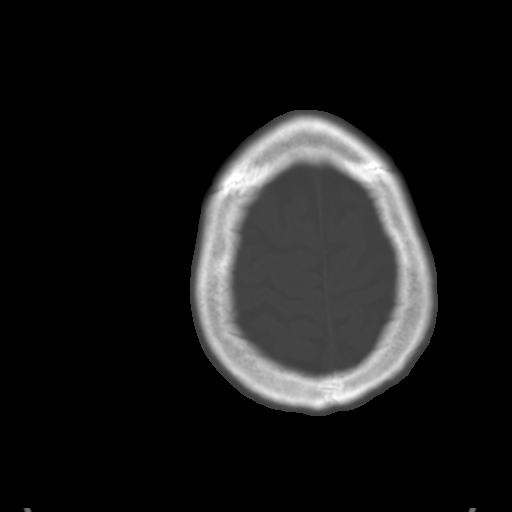
[im 32/36  brain]
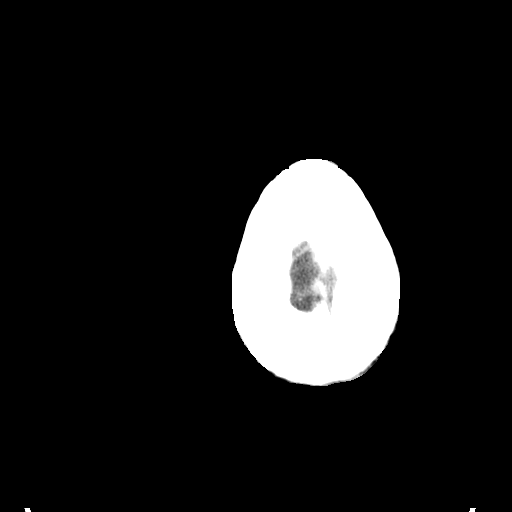
[im 34/36  brain]
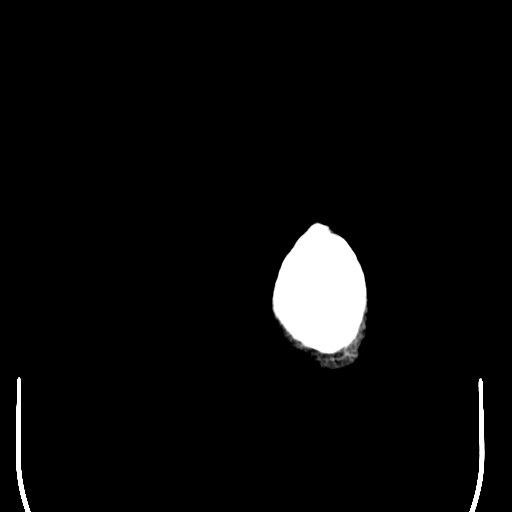

[15 of 30 positions shown; findings below may reference images not displayed]

FINDINGS: There is no evidence of acute infarction, mass lesion, or intra- or
extra-axial hemorrhage on CT.

Prominence of the ventricles and sulci reflects mild cortical volume
loss. Mild periventricular white matter change likely reflects small
vessel ischemic microangiopathy.

The brainstem and fourth ventricle are within normal limits. The
basal ganglia are unremarkable in appearance. The cerebral
hemispheres demonstrate grossly normal gray-white differentiation.
No mass effect or midline shift is seen.

There is no evidence of fracture; visualized osseous structures are
unremarkable in appearance. The orbits are within normal limits. The
paranasal sinuses and mastoid air cells are well-aerated. No
significant soft tissue abnormalities are seen.
IMPRESSION: 1. No acute intracranial pathology seen on CT.
2. Mild cortical volume loss and scattered small vessel ischemic
microangiopathy.

These results were called by telephone at the time of interpretation
on 09/22/2014 at [DATE] to Dr. DAVIDA TIGER, who verbally
acknowledged these results.

## 2016-01-29 IMAGING — DX DG FOOT COMPLETE 3+V*R*
3 series · 3 of 3 positions shown · non-contrast
Comparison: 10/01/2014

CLINICAL DATA: Recent metatarsal amputation. Fell. Pain and
swelling.

EXAM:
RIGHT FOOT COMPLETE - 3+ VIEW

[foot ap]
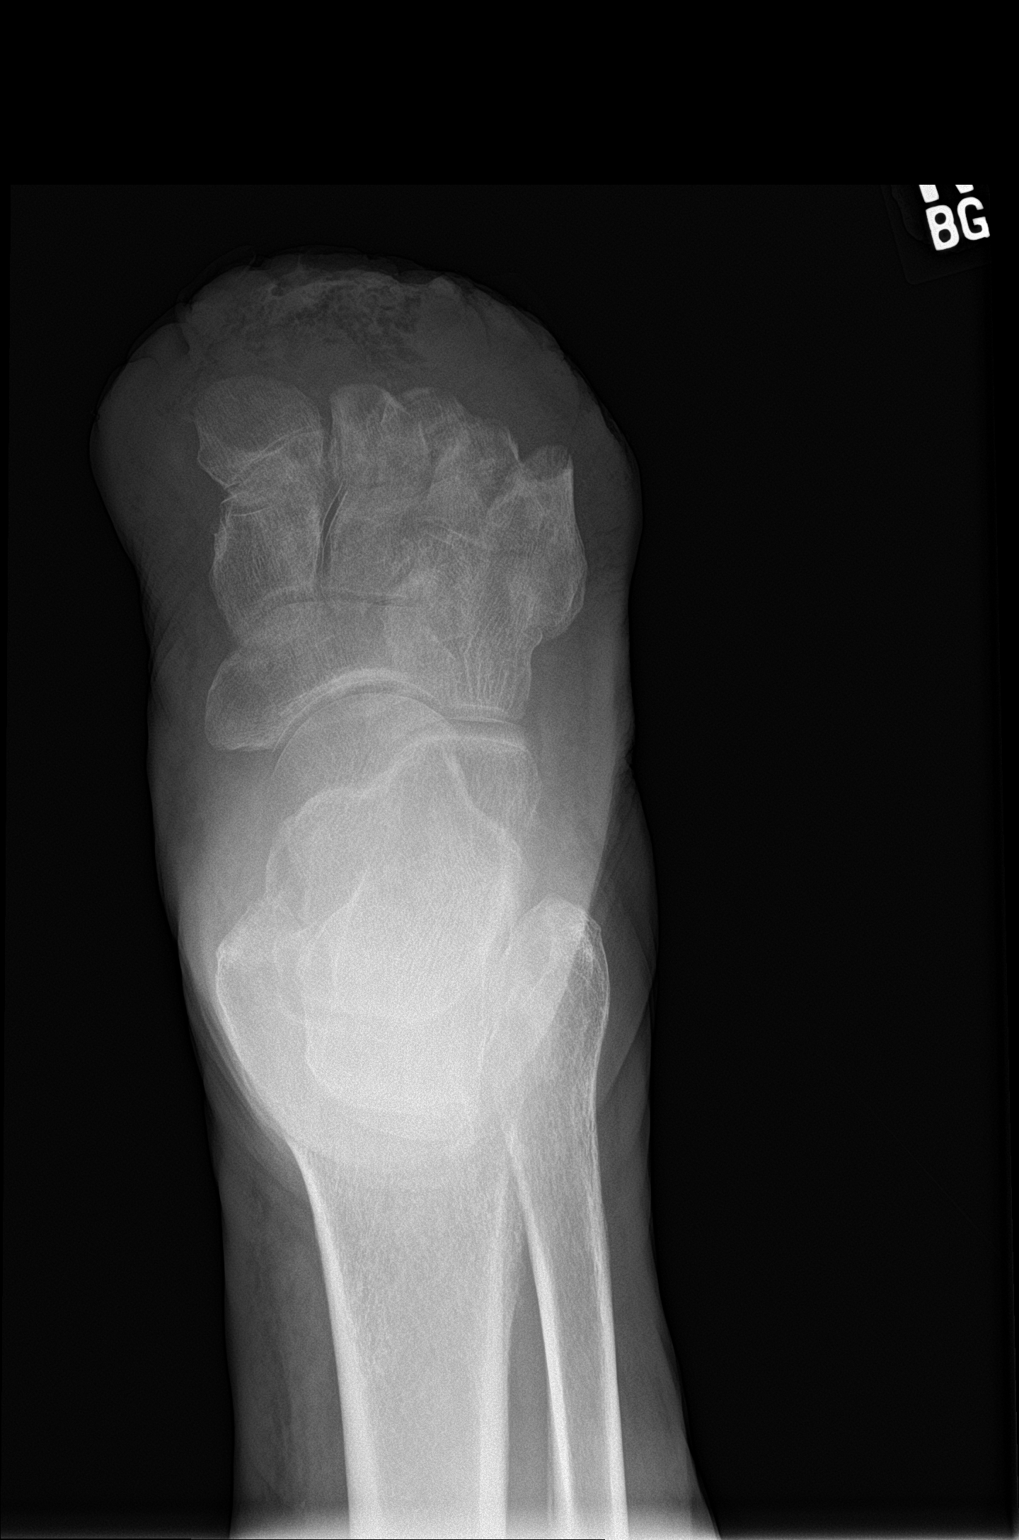

[foot obl]
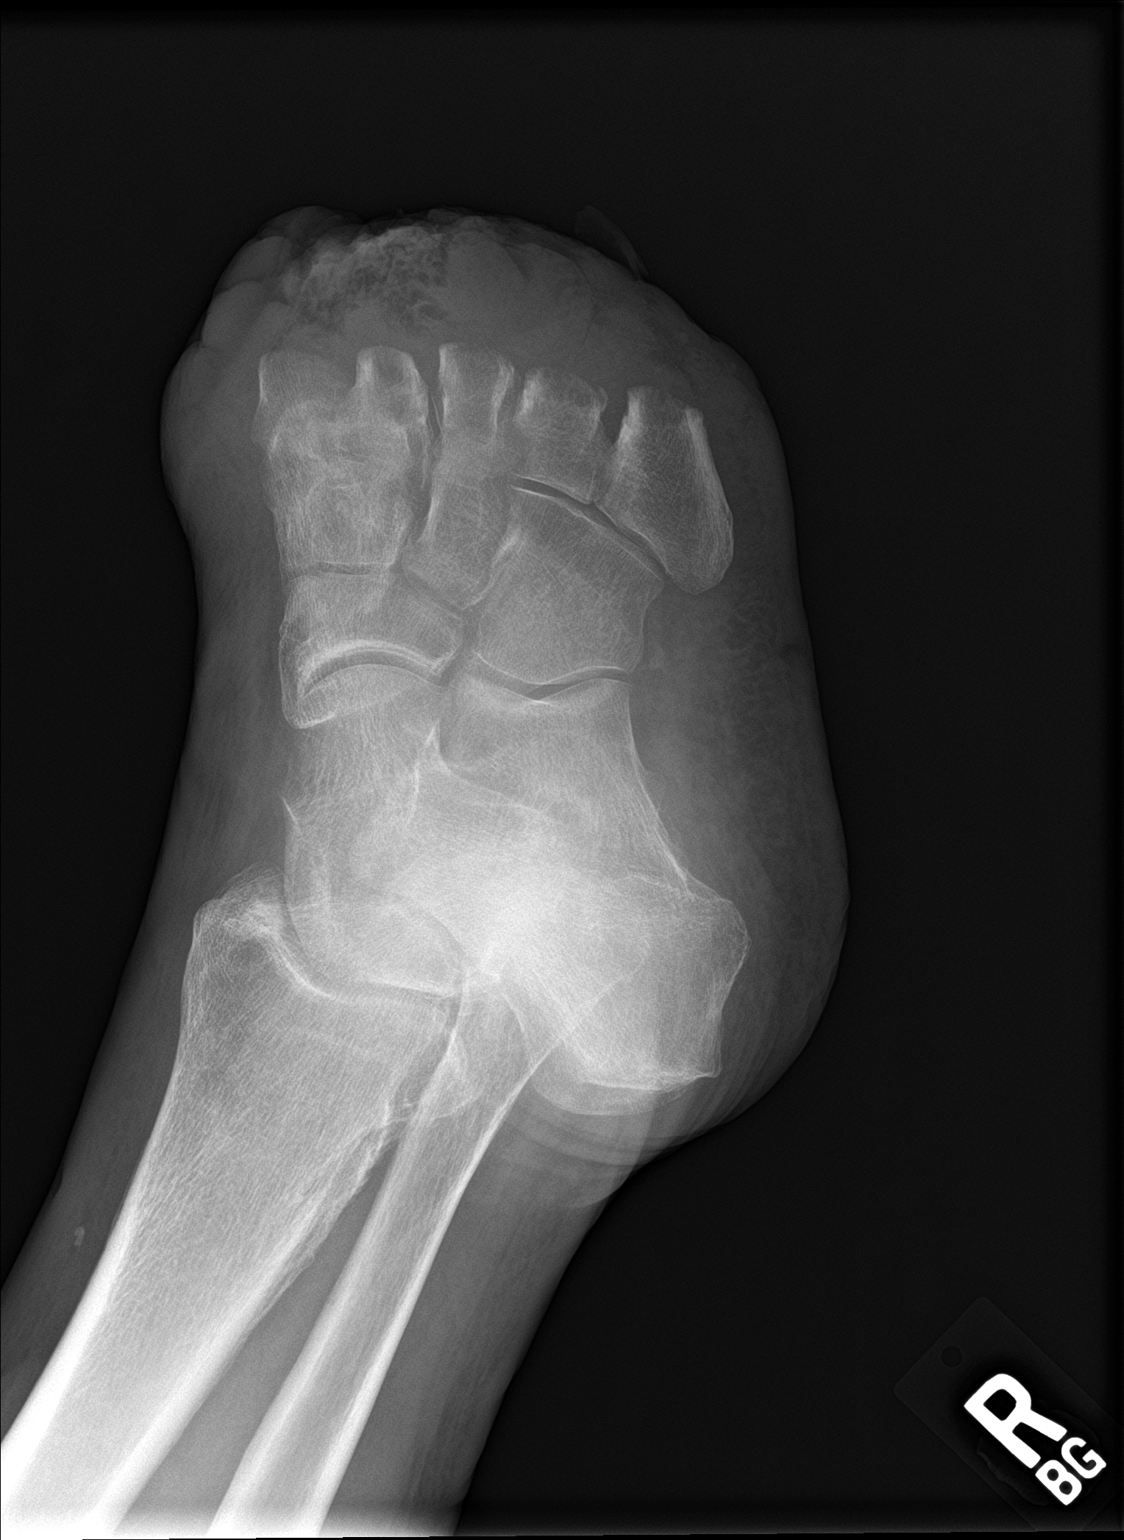

[foot lat]
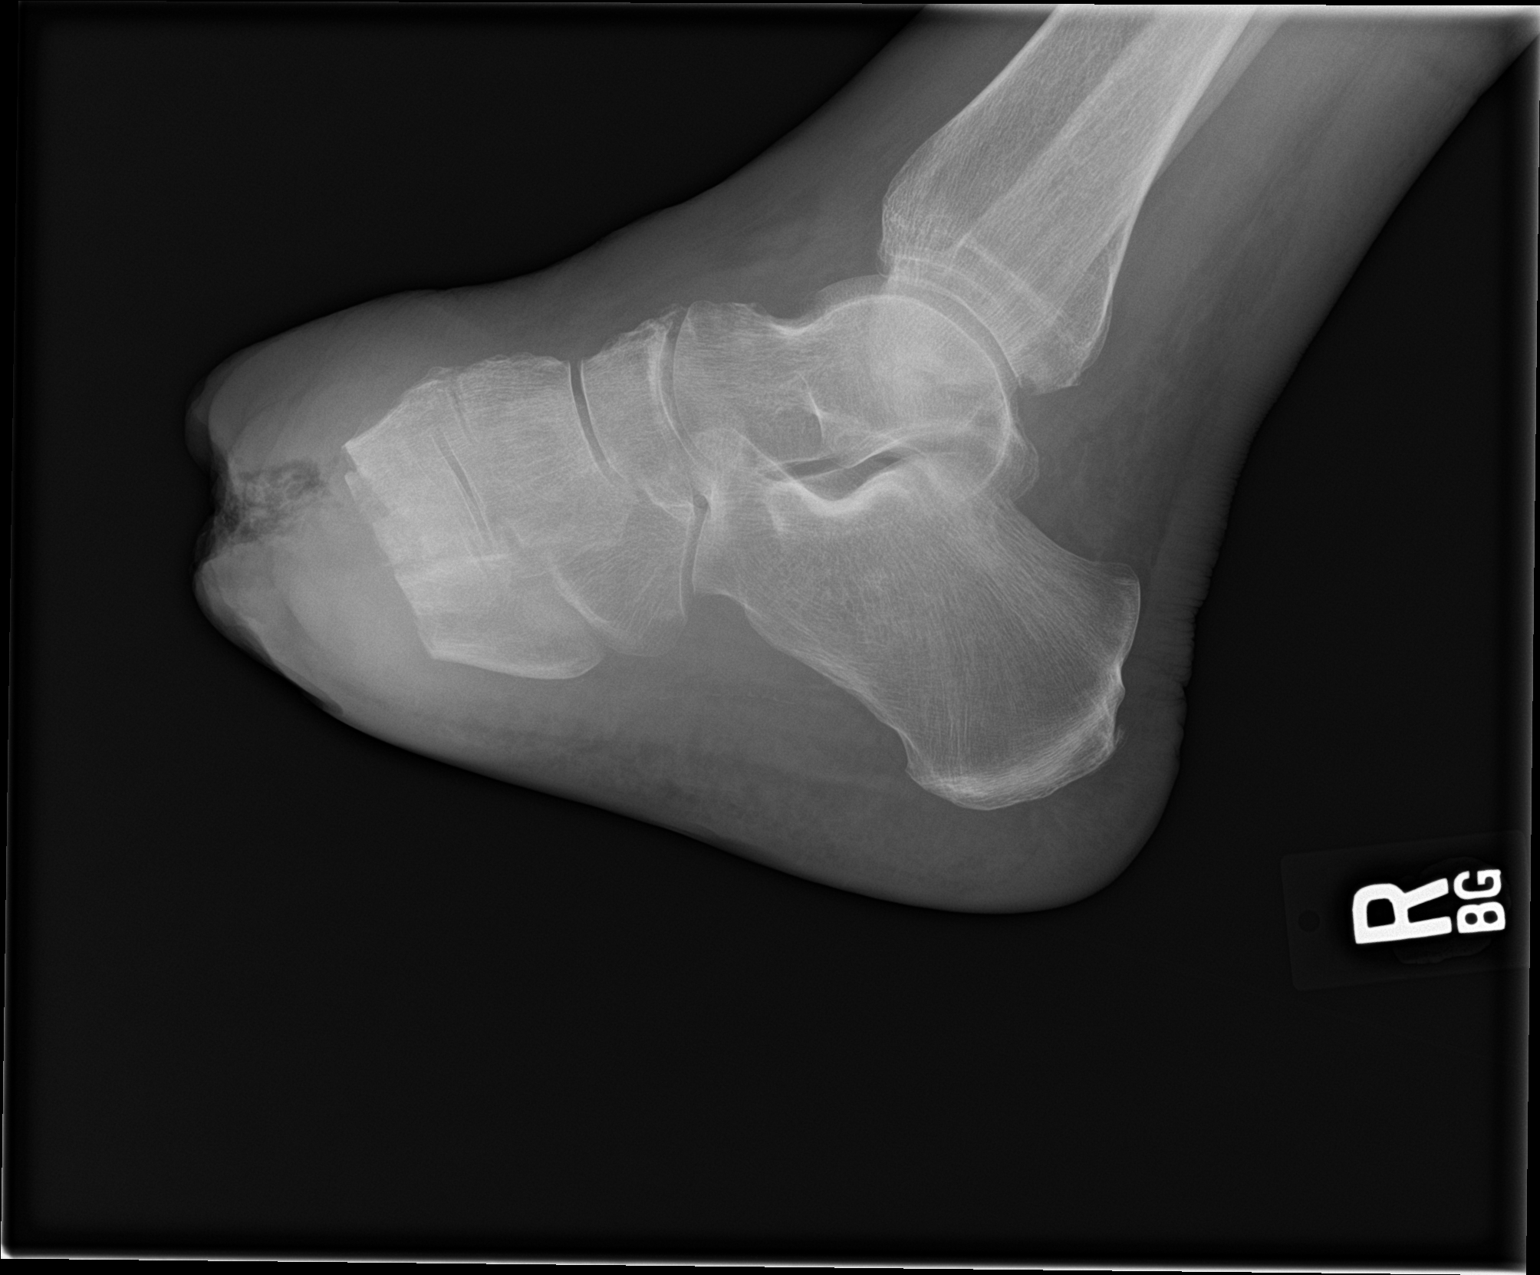

[3 of 3 positions shown; findings below may reference images not displayed]

FINDINGS: Stable surgical changes from a proximal metatarsal midfoot
amputation. There is air in the soft tissues which was not present
on the prior film. This could be due to disruption of the wound. I
do not see any definite findings for acute fracture or
osteomyelitis.
IMPRESSION: New areas noted in the soft tissues of the foot overlying the
amputation. This could be due to disruption of the wound or wound
dehiscence.

Stable appearance at the amputation site. No fracture or evidence of
osteomyelitis.

## 2016-01-31 IMAGING — CR DG FOOT COMPLETE 3+V*R*
1 series · 3 of 3 positions shown · non-contrast
Comparison: 10/10/2014

CLINICAL DATA: Right foot amputation 09/12/2014. Pain. Erythema.
Using.

EXAM:
RIGHT FOOT COMPLETE - 3+ VIEW

[Series 2: ap · 0.17mm/px · 3 of 3 slices shown]
[im 1/3]
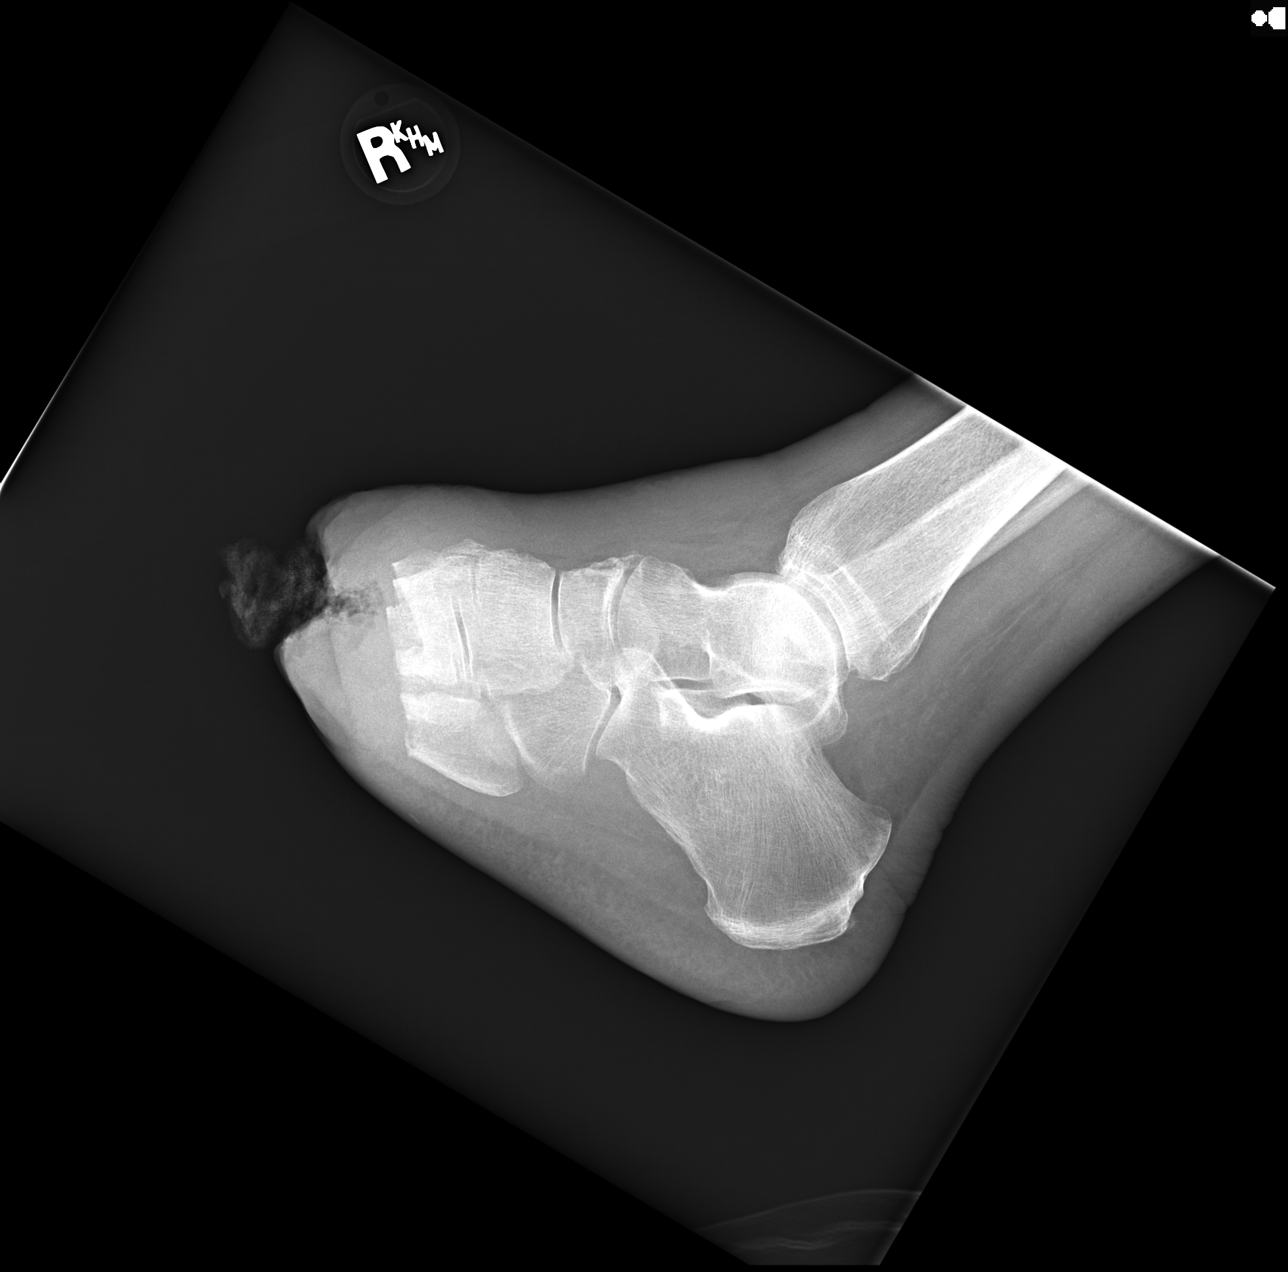
[im 2/3]
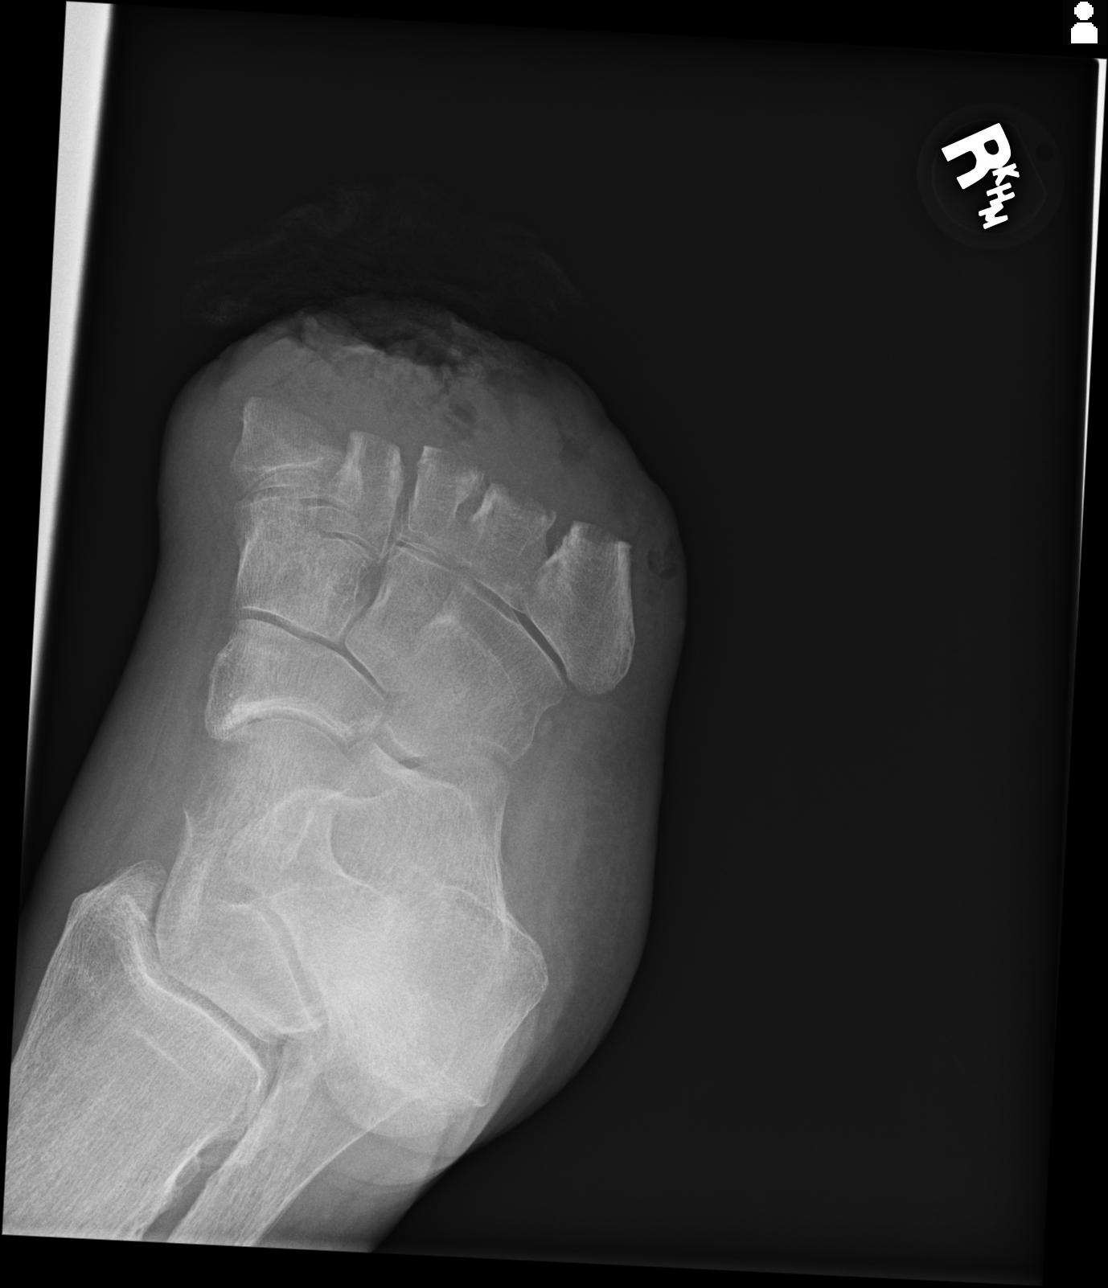
[im 3/3]
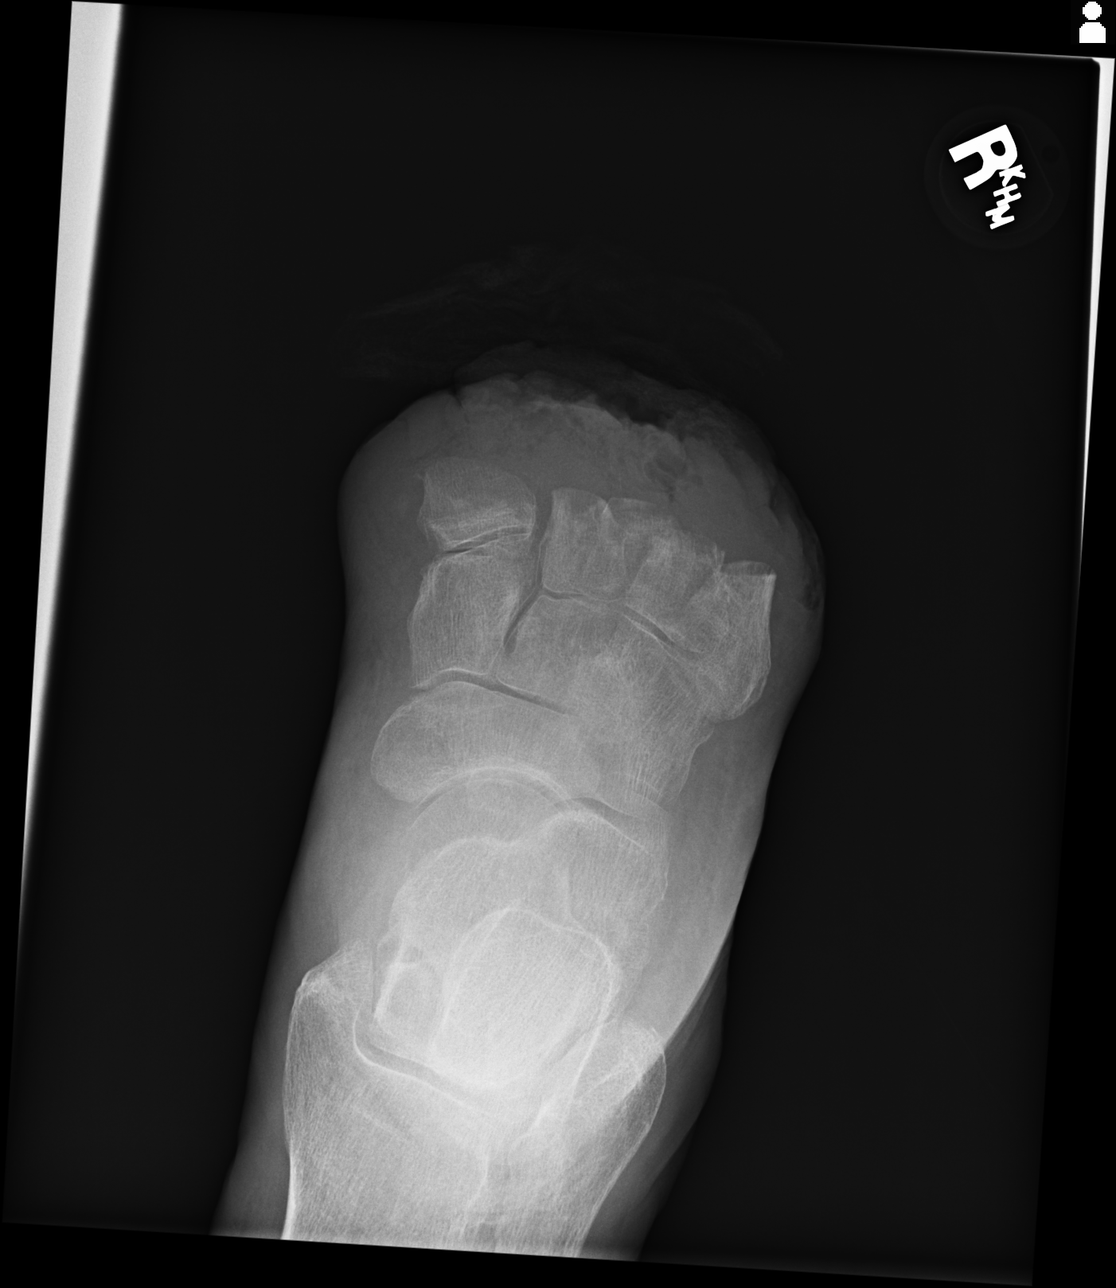

[3 of 3 positions shown; findings below may reference images not displayed]

FINDINGS: There is irregularity of the soft tissues along the amputation bed.
There is some gas penetration along the soft tissues, similar to
10/10/2014. The foot is amputated at the proximal metatarsal
metaphyseal level.

Soft tissue swelling along the stump and midfoot. No obvious
osteolysis along the metatarsal bases.
IMPRESSION: 1. Speckled gas density centrally along the amputation bed extending
back towards the metatarsal bases, question breakdown of the
amputation bed flap. Localized soft tissue infection is not
excluded. There is considerable soft tissue swelling in the midfoot
and along the amputation bed. No bony destruction.

## 2018-05-28 DEATH — deceased
